# Patient Record
Sex: Male | Born: 1965
Health system: Southern US, Community
[De-identification: ages and names within clinical notes are randomized; demographics above are authoritative.]

## PROBLEM LIST (undated history)

## (undated) DIAGNOSIS — F209 Schizophrenia, unspecified: Secondary | ICD-10-CM

## (undated) DIAGNOSIS — F419 Anxiety disorder, unspecified: Secondary | ICD-10-CM

## (undated) DIAGNOSIS — F411 Generalized anxiety disorder: Secondary | ICD-10-CM

## (undated) DIAGNOSIS — E119 Type 2 diabetes mellitus without complications: Secondary | ICD-10-CM

## (undated) HISTORY — DX: Schizophrenia, unspecified: F20.9

## (undated) HISTORY — DX: Anxiety disorder, unspecified: F41.9

## (undated) HISTORY — DX: Generalized anxiety disorder: F41.1

## (undated) HISTORY — PX: TONSILLECTOMY: SUR1361

---

## 2006-11-15 ENCOUNTER — Emergency Department: Payer: Self-pay | Admitting: Emergency Medicine

## 2007-04-18 ENCOUNTER — Emergency Department: Payer: Self-pay | Admitting: Emergency Medicine

## 2007-04-24 ENCOUNTER — Emergency Department: Payer: Self-pay | Admitting: Emergency Medicine

## 2007-04-25 ENCOUNTER — Ambulatory Visit: Payer: Self-pay | Admitting: Internal Medicine

## 2007-05-11 ENCOUNTER — Ambulatory Visit: Payer: Self-pay | Admitting: Internal Medicine

## 2007-08-10 ENCOUNTER — Ambulatory Visit: Payer: Self-pay | Admitting: Internal Medicine

## 2014-03-19 LAB — COMPREHENSIVE METABOLIC PANEL
ALT: 37 U/L
ANION GAP: 8 (ref 7–16)
AST: 30 U/L (ref 15–37)
Albumin: 4.1 g/dL (ref 3.4–5.0)
Alkaline Phosphatase: 88 U/L
BILIRUBIN TOTAL: 0.6 mg/dL (ref 0.2–1.0)
BUN: 25 mg/dL — AB (ref 7–18)
Calcium, Total: 8.6 mg/dL (ref 8.5–10.1)
Chloride: 102 mmol/L (ref 98–107)
Co2: 30 mmol/L (ref 21–32)
Creatinine: 1.27 mg/dL (ref 0.60–1.30)
EGFR (Non-African Amer.): >60
GLUCOSE: 104 mg/dL — AB (ref 65–99)
Osmolality: 284 (ref 275–301)
Potassium: 3.9 mmol/L (ref 3.5–5.1)
Sodium: 140 mmol/L (ref 136–145)
Total Protein: 7.4 g/dL (ref 6.4–8.2)

## 2014-03-19 LAB — DRUG SCREEN, URINE
Amphetamines, Ur Screen: NEGATIVE (ref ?–1000)
BARBITURATES, UR SCREEN: NEGATIVE (ref ?–200)
BENZODIAZEPINE, UR SCRN: NEGATIVE (ref ?–200)
CANNABINOID 50 NG, UR ~~LOC~~: NEGATIVE (ref ?–50)
COCAINE METABOLITE, UR ~~LOC~~: NEGATIVE (ref ?–300)
MDMA (Ecstasy)Ur Screen: NEGATIVE (ref ?–500)
Methadone, Ur Screen: NEGATIVE (ref ?–300)
OPIATE, UR SCREEN: NEGATIVE (ref ?–300)
Phencyclidine (PCP) Ur S: NEGATIVE (ref ?–25)
Tricyclic, Ur Screen: NEGATIVE (ref ?–1000)

## 2014-03-19 LAB — SALICYLATE LEVEL

## 2014-03-19 LAB — CBC
HCT: 45.8 % (ref 40.0–52.0)
HGB: 15.3 g/dL (ref 13.0–18.0)
MCH: 29.5 pg (ref 26.0–34.0)
MCHC: 33.5 g/dL (ref 32.0–36.0)
MCV: 88 fL (ref 80–100)
Platelet: 239 10*3/uL (ref 150–440)
RBC: 5.2 10*6/uL (ref 4.40–5.90)
RDW: 13.2 % (ref 11.5–14.5)
WBC: 7.9 10*3/uL (ref 3.8–10.6)

## 2014-03-19 LAB — URINALYSIS, COMPLETE
BILIRUBIN, UR: NEGATIVE
Bacteria: NONE SEEN
Blood: NEGATIVE
Glucose,UR: NEGATIVE mg/dL (ref 0–75)
Ketone: NEGATIVE
LEUKOCYTE ESTERASE: NEGATIVE
NITRITE: NEGATIVE
PROTEIN: NEGATIVE
Ph: 7 (ref 4.5–8.0)
RBC,UR: <1 /HPF (ref 0–5)
Specific Gravity: 1.005 (ref 1.003–1.030)
Squamous Epithelial: NONE SEEN
WBC UR: <1 /HPF (ref 0–5)

## 2014-03-19 LAB — ACETAMINOPHEN LEVEL: Acetaminophen: <2 ug/mL

## 2014-03-19 LAB — ETHANOL

## 2014-03-20 ENCOUNTER — Inpatient Hospital Stay: Payer: Self-pay | Admitting: Psychiatry

## 2014-03-23 LAB — LIPID PANEL
Cholesterol: 154 mg/dL (ref 0–200)
HDL: 26 mg/dL — AB (ref 40–60)
Ldl Cholesterol, Calc: 93 mg/dL (ref 0–100)
TRIGLYCERIDES: 176 mg/dL (ref 0–200)
VLDL CHOLESTEROL, CALC: 35 mg/dL (ref 5–40)

## 2014-03-23 LAB — HEMOGLOBIN A1C: HEMOGLOBIN A1C: 5.5 % (ref 4.2–6.3)

## 2014-03-23 LAB — FOLATE: Folic Acid: 32.5 ng/mL (ref 3.1–100.0)

## 2014-03-23 LAB — TSH: THYROID STIMULATING HORM: 2.13 u[IU]/mL

## 2014-10-01 NOTE — H&P (Signed)
PATIENT NAME:  Benjamin Braun, Benjamin Braun MR#:  [id number] DATE OF BIRTH:  1966/04/17  DATE OF ADMISSION:  03/20/2014  DATE OF ASSESSMENT: 03/21/2014   REFERRING PHYSICIAN: Emergency Room MD    ATTENDING PHYSICIAN: Kiril Hippe B. Bary Leriche, MD   IDENTIFYING DATA: Benjamin Braun is a 49 year old male with history of schizophrenia.   CHIEF COMPLAINT: "I need to go home to my pappy."   HISTORY OF PRESENT ILLNESS: Benjamin Braun has a long history of schizophrenia. He has been a patient of Dr. Johnanna Schneiders at [facility] for years. Apparently he relocated to our area and has been a patient of Dr. Annitta Jersey. The patient is a poor historian and really unable to provide much information about the circumstances of his admission. Apparently he stopped prescribed medication of Risperdal and became increasingly psychotic. He called police on 2 of his neighbors believing that they behave strangely. When police arrived at the scene they brought the patient to the Emergency Room. He was paranoid and slightly agitated.   In the Emergency Room, he felt that we put sperm in the water and that he contracted herpes this way in our Emergency Room. At the same time he tells me that he was tested for herpes by his primary doctor and he is alright, which tells me that it may be a fixed delusion. He admits to stopping Risperdal as it made his stomach tight. He continued taking clonazepam but even that is doubtful since his last appointment with Dr. Annitta Jersey was a while ago. Dr. Annitta Jersey reported that the patient is psychotic and paranoid at baseline, noncompliant with medication and doctor's visits. She does not feel comfortable continuing with this patient and believes that he needs additional resources in the community. Unfortunately, he has Managed Medicare only and does not qualify for [facility] services. We will find a provider in our community to serve this patient. The patient reports that in the past he did well on Zyprexa but agrees to take only 2 mg  of it. He also likes clonazepam. He adamantly denies any symptoms of depression, anxiety, or psychosis. He tells me that his pappy and a fish were left in the house and he worries that pappy may be dead already. He also left some exotic plants out and worries that they are also destroyed as it was a cold night last night. He denies thoughts of hurting himself or others. He denies substance use.   PAST PSYCHIATRIC HISTORY: Four or 5 past hospitalizations mostly at [facility]. Last time he tells me a while ago he was brought to the Emergency Room of [facility] but was not admitted, discharged to home after 4 hours. He denies ever attempting suicide.   FAMILY PSYCHIATRIC HISTORY: The patient denies any history of mental illness in the family.   PAST MEDICAL HISTORY: Reportedly he has elevated blood pressure but takes no medication for this. He is preoccupied with sexually transmitted diseases but it probably is delusional.   ALLERGIES: PERCOCET.   MEDICATIONS ON ADMISSION: Klonopin, unknown dose, Risperdal unknown dose. The patient was noncompliant.   SOCIAL HISTORY: He lives by himself. He is disabled from mental illness. He has Multimedia programmer. There is no family involvement.   REVIEW OF SYSTEMS:  CONSTITUTIONAL: No fevers or chills. No weight changes.  EYES: No double or blurred vision.  EARS, NOSE AND THROAT: No hearing loss.   RESPIRATORY: No shortness of breath or cough.  CARDIOVASCULAR: No chest pain or orthopnea.  GASTROINTESTINAL: No abdominal pain, nausea, vomiting,  or diarrhea.  GENITOURINARY: No incontinence or frequency.  ENDOCRINE: No heat or cold intolerance.  LYMPHATIC: No anemia or easy bruising.  INTEGUMENTARY: No acne or rash.  MUSCULOSKELETAL: No muscle or joint pain.  NEUROLOGIC: No tingling or weakness.  PSYCHIATRIC: See history of present illness.   PHYSICAL EXAMINATION:  VITAL SIGNS: Blood pressure 155/101, pulse 106, respirations 20, temperature 99.   GENERAL: This is a  well-developed male in no acute distress.  HEENT: The pupils are equal, round, and reactive to light. Sclerae anicteric. The patient complains of short sidedness and is hard of hearing. He did not bring his glasses or his hearing aides to the hospital and it makes the interview a little more difficult.  NECK: Supple. No thyromegaly.  LUNGS: Clear to auscultation.  No dullness to percussion.  HEART: Regular rhythm and rate. No murmurs, rubs, or gallops.  ABDOMEN: Soft, nontender, nondistended. Positive bowel sounds.  MUSCULOSKELETAL: Normal muscle strength in all extremities.  SKIN: No rashes or bruises.  LYMPHATIC: No cervical adenopathy.  NEUROLOGIC: Cranial nerves II through XII are intact.   LABORATORY DATA: Chemistries are within normal limits except for blood glucose of 104. Blood alcohol level is 0. LFTs within normal limits. Urine tox screen negative for substances. CBC within normal limits. Urinalysis is not suggestive of urinary tract infection. Serum acetaminophen and salicylate are low.   MENTAL STATUS EXAMINATION ON ADMISSION: The patient is alert and oriented to person, place, time and somewhat to situation. He is anxious-appearing. He maintains good eye contact. His speech is of normal rhythm, rate and volume. He is well groomed and casually dressed. His mood is anxious with odd affect. Thought process is logical and goal oriented. Thought content: He denies thoughts of hurting himself or others, delusions or paranoia. He denies auditory or visual hallucinations but clearly was paranoid and delusional on admission and most likely hallucinating according to the note from the Emergency Room. His cognition is grossly intact but difficult to assess in a formal away. He seems of normal intelligence and fund of knowledge. His insight and judgment are extremely poor.   SUICIDE RISK ASSESSMENT ON ADMISSION: This is a patient with a long history of schizophrenia but no mood symptoms or suicidal  history who came to the hospital floridly psychotic, unable to take care of himself in the context of treatment noncompliance.   INITIAL DIAGNOSES:  AXIS I: Schizophrenia.  AXIS II: Deferred.  AXIS III: Hypertension.   PLAN: The patient was admitted to [facility] for safety, stabilization and medication management. He was initially placed on suicide precautions and was closely monitored for any unsafe behaviors. He underwent full psychiatric and risk assessment. He received pharmacotherapy, individual and group psychotherapy, substance abuse counseling, and support from therapeutic milieu.  1.  Psychosis. We will start Zyprexa 10 mg at night.  2.  Anxiety. We will start Klonopin 0.5 mg 3 times daily.  3.  Social. Education officer, museum to investigate if there truly is a pappy and if we can save the pappy.  4.  Disposition. He will be discharged back to home. Follow up to be established. There are no providers within the network for this unfortunate patient in our area.    ____________________________ Herma Ard B. Bary Leriche, MD jbp:AT D: 03/21/2014 23:22:00 ET T: 03/22/2014 00:09:20 ET JOB#: [id number]  cc: Kale Dols B. Bary Leriche, MD, <Dictator> Clovis Fredrickson MD ELECTRONICALLY SIGNED 04/18/2014 6:15

## 2014-11-18 ENCOUNTER — Ambulatory Visit (INDEPENDENT_AMBULATORY_CARE_PROVIDER_SITE_OTHER): Payer: Medicare HMO | Admitting: Psychiatry

## 2014-11-18 ENCOUNTER — Encounter: Payer: Self-pay | Admitting: Psychiatry

## 2014-11-18 VITALS — BP 118/76 | HR 100 | Temp 97.7°F | Ht 67.0 in

## 2014-11-18 DIAGNOSIS — F209 Schizophrenia, unspecified: Secondary | ICD-10-CM | POA: Diagnosis not present

## 2014-11-18 MED ORDER — OLANZAPINE 5 MG PO TABS: 5.0000 mg | ORAL_TABLET | Freq: Every day | ORAL | Status: DC

## 2014-11-18 MED ORDER — CLONAZEPAM 1 MG PO TABS: 1.0000 mg | ORAL_TABLET | Freq: Two times a day (BID) | ORAL | Status: DC

## 2014-11-18 NOTE — Progress Notes (Signed)
[place] MD/PA/NP OP Progress Note  11/18/2014 11:00 AM Benjamin Braun  MRN:  [id number]  Subjective:  Patient returns for follow-up of his schizophrenia. He states there been no changes in his issues. He states he still hears a voice of God. He states it's a pleasant voice and gives some good advice. He states he has other voices but denies that any of them give him commands to hurt himself or others. As in past he has some paranoia about people stealing from him. He states he enjoys watching TV. He states his appetite is been good and his sleep is good.  Again discussed with metabolic issues related to Zyprexa and given him a lab slip to have these labs assessed. Chief Complaint: refills Visit Diagnosis:     ICD-9-CM ICD-10-CM   1. Schizophrenia, unspecified type 295.90 F20.9 OLANZapine (ZYPREXA) 5 MG tablet     clonazePAM (KLONOPIN) 1 MG tablet    Past Medical History:  Past Medical History  Diagnosis Date  . Schizophrenia   . Generalized anxiety disorder    History reviewed. No pertinent past surgical history. Family History: No family history on file. Social History:  History   Social History  . Marital Status: Single    Spouse Name: N/A  . Number of Children: N/A  . Years of Education: N/A   Social History Main Topics  . Smoking status: Former Smoker    Types: Cigarettes    Quit date: 06/11/1999  . Smokeless tobacco: Never Used  . Alcohol Use: Not on file  . Drug Use: Not on file  . Sexual Activity: Not on file   Other Topics Concern  . None   Social History Narrative  . None   Additional History:   Assessment:   Musculoskeletal: Strength & Muscle Tone: within normal limits Gait & Station: normal Patient leans: N/A  Psychiatric Specialty Exam: HPI  Review of Systems  Psychiatric/Behavioral: Positive for hallucinations (he hears the voice of God as he has in the past. This is been stable and is not distressing to him.). Negative for depression, suicidal ideas,  memory loss and substance abuse. The patient is not nervous/anxious and does not have insomnia.     Blood pressure 118/76, pulse 100, temperature 97.7 F (36.5 C), temperature source Tympanic, height 5\' 7"  (1.702 m), SpO2 96 %.There is no weight on file to calculate BMI.  General Appearance: Fairly Groomed  Eye Contact:  Fair  Speech:  Normal Rate  Volume:  Normal  Mood:  Good  Affect:  Bright  Thought Process:  Loose at baseline  Orientation:  Full (Time, Place, and Person)  Thought Content:  Negative and Paranoia about people stealing from him  Suicidal Thoughts:  No  Homicidal Thoughts:  No  Memory:  Immediate;   Fair Recent;   Fair Remote;   Fair  Judgement:  Fair  Insight:  Fair  Psychomotor Activity:  Negative  Concentration:  Fair  Recall:  [employer] of Knowledge: Fair  Language: Good  Akathisia:  Negative  Handed:  Right unknown  AIMS (if indicated):  Last done November 2015  Assets:  Desire for Improvement  ADL's:  Intact  Cognition: WNL  Sleep:  Good   Is the patient at risk to self?  No. Has the patient been a risk to self in the past 6 months?  No. Has the patient been a risk to self within the distant past?  No. Is the patient a risk to others?  No.  Has the patient been a risk to others in the past 6 months?  No. Has the patient been a risk to others within the distant past?  No.  Current Medications: Current Outpatient Prescriptions  Medication Sig Dispense Refill  . clonazePAM (KLONOPIN) 1 MG tablet Take 1 tablet (1 mg total) by mouth 2 (two) times daily. 60 tablet 3  . OLANZapine (ZYPREXA) 5 MG tablet Take 1 tablet (5 mg total) by mouth daily. 30 tablet 3   No current facility-administered medications for this visit.    Medical Decision Making:  Established Problem, Stable/Improving (1)  Treatment Plan Summary:Medication management will continue patient's medications without any changes. We will continue Zyprexa 5 mg daily and clonazepam 1 mg twice  daily. Follow-up in 3 months. I continued to try to encourage him and work with him to get metabolic labs done. I have encouraged him to continue to try to make his appointments for medication monitoring.   Faith Rogue 11/18/2014, 11:00 AM

## 2015-03-02 ENCOUNTER — Telehealth: Payer: Self-pay

## 2015-03-02 NOTE — Telephone Encounter (Signed)
pt states medication no working wants something

## 2015-03-03 NOTE — Telephone Encounter (Signed)
line was busy could not leave message

## 2015-03-17 ENCOUNTER — Ambulatory Visit: Payer: Self-pay | Admitting: Psychiatry

## 2015-03-24 ENCOUNTER — Ambulatory Visit (INDEPENDENT_AMBULATORY_CARE_PROVIDER_SITE_OTHER): Payer: Medicare HMO | Admitting: Psychiatry

## 2015-03-24 ENCOUNTER — Encounter: Payer: Self-pay | Admitting: Psychiatry

## 2015-03-24 VITALS — BP 122/82 | HR 132 | Temp 97.1°F | Ht 66.0 in | Wt 197.2 lb

## 2015-03-24 DIAGNOSIS — F209 Schizophrenia, unspecified: Secondary | ICD-10-CM | POA: Diagnosis not present

## 2015-03-24 MED ORDER — CLONAZEPAM 1 MG PO TABS: 1.0000 mg | ORAL_TABLET | Freq: Two times a day (BID) | ORAL | Status: DC

## 2015-03-24 MED ORDER — OLANZAPINE 5 MG PO TABS: 5.0000 mg | ORAL_TABLET | Freq: Every day | ORAL | Status: DC
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
## 2015-03-28 ENCOUNTER — Telehealth: Payer: Self-pay | Admitting: Psychiatry

## 2015-03-29 NOTE — Telephone Encounter (Signed)
line was busy.

## 2015-04-03 NOTE — Telephone Encounter (Signed)
line busy

## 2015-04-06 ENCOUNTER — Other Ambulatory Visit: Payer: Self-pay | Admitting: Psychiatry

## 2015-04-07 ENCOUNTER — Other Ambulatory Visit: Payer: Self-pay | Admitting: Psychiatry

## 2015-04-13 ENCOUNTER — Other Ambulatory Visit: Payer: Self-pay

## 2015-04-13 ENCOUNTER — Emergency Department
Admission: EM | Admit: 2015-04-13 | Discharge: 2015-04-15 | Disposition: A | Discharge status: Other Acute Inpatient Hospital | Payer: Medicare HMO | Attending: Student | Admitting: Student

## 2015-04-13 ENCOUNTER — Emergency Department: Payer: Medicare HMO

## 2015-04-13 DIAGNOSIS — Z87891 Personal history of nicotine dependence: Secondary | ICD-10-CM | POA: Diagnosis not present

## 2015-04-13 DIAGNOSIS — F200 Paranoid schizophrenia: Secondary | ICD-10-CM | POA: Diagnosis not present

## 2015-04-13 DIAGNOSIS — Z79899 Other long term (current) drug therapy: Secondary | ICD-10-CM | POA: Insufficient documentation

## 2015-04-13 DIAGNOSIS — F209 Schizophrenia, unspecified: Secondary | ICD-10-CM | POA: Diagnosis not present

## 2015-04-13 DIAGNOSIS — Z91199 Patient's noncompliance with other medical treatment and regimen due to unspecified reason: Secondary | ICD-10-CM

## 2015-04-13 DIAGNOSIS — Z9119 Patient's noncompliance with other medical treatment and regimen: Secondary | ICD-10-CM

## 2015-04-13 DIAGNOSIS — F919 Conduct disorder, unspecified: Secondary | ICD-10-CM | POA: Diagnosis present

## 2015-04-13 LAB — CBC
HCT: 43.2 % (ref 40.0–52.0)
HEMOGLOBIN: 14.6 g/dL (ref 13.0–18.0)
MCH: 29 pg (ref 26.0–34.0)
MCHC: 33.7 g/dL (ref 32.0–36.0)
MCV: 86 fL (ref 80.0–100.0)
Platelets: 238 10*3/uL (ref 150–440)
RBC: 5.02 MIL/uL (ref 4.40–5.90)
RDW: 13.1 % (ref 11.5–14.5)
WBC: 8.3 10*3/uL (ref 3.8–10.6)

## 2015-04-13 LAB — COMPREHENSIVE METABOLIC PANEL
ALT: 46 U/L (ref 17–63)
AST: 40 U/L (ref 15–41)
Albumin: 4.2 g/dL (ref 3.5–5.0)
Alkaline Phosphatase: 112 U/L (ref 38–126)
Anion gap: 7 (ref 5–15)
BUN: 15 mg/dL (ref 6–20)
CALCIUM: 8.8 mg/dL — AB (ref 8.9–10.3)
CHLORIDE: 104 mmol/L (ref 101–111)
CO2: 28 mmol/L (ref 22–32)
CREATININE: 1.35 mg/dL — AB (ref 0.61–1.24)
GFR, EST NON AFRICAN AMERICAN: 59 mL/min — AB (ref >60)
Glucose, Bld: 189 mg/dL — ABNORMAL HIGH (ref 65–99)
POTASSIUM: 3.6 mmol/L (ref 3.5–5.1)
Sodium: 139 mmol/L (ref 135–145)
Total Bilirubin: 0.5 mg/dL (ref 0.3–1.2)
Total Protein: 6.8 g/dL (ref 6.5–8.1)

## 2015-04-13 LAB — ETHANOL: Alcohol, Ethyl (B): <5 mg/dL (ref <5)

## 2015-04-13 LAB — GLUCOSE, CAPILLARY: GLUCOSE-CAPILLARY: 244 mg/dL — AB (ref 65–99)

## 2015-04-13 LAB — SALICYLATE LEVEL

## 2015-04-13 LAB — ACETAMINOPHEN LEVEL

## 2015-04-13 NOTE — ED Notes (Signed)
Pt brought to ER per pt,  by [facility] after [employer] Deputies came to his apt, and took him away after a complaint (pt did not express if complaint was from pt or neighbor). Pt states " I think my neighbor is coming in my door when I am gone and stealing from me, he plays black music all the time, I like it sometimes but not all the time" "God wants me to listen to christian music, I do sometimes, but God and I have not been talking lately". Pt hyper-religious during assessment. Pt denies SI/HI/AH/VH, paranoia, and use of drugs or ETOH.  Pt talking while others are not in room. Pt stated "I am asking God if there is anything I can do for him that will help me to be free"

## 2015-04-13 NOTE — ED Notes (Addendum)
Pt brought in by [employer].  Pt is handcuffed.  Pt is IVC.  Pt hyper religious in triage.   Pt denies SI.  Denies etoh or drug use.  Pt has a hematoma to forehead.  Pt hit head on the ground while being handcuffed.  No loc.  Pt alert.  Speech clear

## 2015-04-13 NOTE — ED Notes (Signed)
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
## 2015-04-13 NOTE — ED Notes (Signed)
Pt in room. Pt drinking water [er request of Dr Edd Fabian to help bring pt's HR down from upper 120's No complaints or concerns voiced at this time. No abnormal behavior noted at this time. Will continue to monitor with q15 min checks. ODS officer in area.

## 2015-04-14 DIAGNOSIS — Z91199 Patient's noncompliance with other medical treatment and regimen due to unspecified reason: Secondary | ICD-10-CM

## 2015-04-14 DIAGNOSIS — Z9119 Patient's noncompliance with other medical treatment and regimen: Secondary | ICD-10-CM

## 2015-04-14 DIAGNOSIS — F209 Schizophrenia, unspecified: Secondary | ICD-10-CM | POA: Diagnosis not present

## 2015-04-14 DIAGNOSIS — F200 Paranoid schizophrenia: Secondary | ICD-10-CM

## 2015-04-14 LAB — URINE DRUG SCREEN, QUALITATIVE (ARMC ONLY)
AMPHETAMINES, UR SCREEN: NOT DETECTED
BENZODIAZEPINE, UR SCRN: NOT DETECTED
Barbiturates, Ur Screen: NOT DETECTED
Cannabinoid 50 Ng, Ur ~~LOC~~: NOT DETECTED
Cocaine Metabolite,Ur ~~LOC~~: NOT DETECTED
MDMA (ECSTASY) UR SCREEN: NOT DETECTED
METHADONE SCREEN, URINE: NOT DETECTED
Opiate, Ur Screen: NOT DETECTED
Phencyclidine (PCP) Ur S: NOT DETECTED
TRICYCLIC, UR SCREEN: NOT DETECTED

## 2015-04-14 LAB — URINALYSIS COMPLETE WITH MICROSCOPIC (ARMC ONLY)
Bacteria, UA: NONE SEEN
Bilirubin Urine: NEGATIVE
Glucose, UA: NEGATIVE mg/dL
HGB URINE DIPSTICK: NEGATIVE
KETONES UR: NEGATIVE mg/dL
LEUKOCYTES UA: NEGATIVE
NITRITE: NEGATIVE
PROTEIN: NEGATIVE mg/dL
SPECIFIC GRAVITY, URINE: 1.008 (ref 1.005–1.030)
pH: 7 (ref 5.0–8.0)

## 2015-04-14 MED ORDER — OLANZAPINE 5 MG PO TABS
ORAL_TABLET | ORAL | Status: AC
  Filled 2015-04-14: qty 1

## 2015-04-14 MED ORDER — ZIPRASIDONE MESYLATE 20 MG IM SOLR: 20.0000 mg | Freq: Once | INTRAMUSCULAR | Status: DC

## 2015-04-14 MED ORDER — CLONAZEPAM 0.5 MG PO TABS
1.0000 mg | ORAL_TABLET | Freq: Once | ORAL | Status: AC
  Administered 2015-04-14: 1 mg via ORAL

## 2015-04-14 MED ORDER — BACITRACIN 500 UNIT/GM EX OINT
TOPICAL_OINTMENT | Freq: Two times a day (BID) | CUTANEOUS | Status: DC
  Administered 2015-04-14: 1 via TOPICAL
  Filled 2015-04-14: qty 0.9

## 2015-04-14 MED ORDER — OLANZAPINE 5 MG PO TABS
5.0000 mg | ORAL_TABLET | ORAL | Status: AC
  Administered 2015-04-14: 5 mg via ORAL

## 2015-04-14 MED ORDER — OLANZAPINE 10 MG PO TABS
10.0000 mg | ORAL_TABLET | Freq: Two times a day (BID) | ORAL | Status: DC
  Administered 2015-04-14: 15:00:00 via ORAL
  Administered 2015-04-14 – 2015-04-15 (×2): 10 mg via ORAL
  Filled 2015-04-14 (×3): qty 1

## 2015-04-14 MED ORDER — TRAZODONE HCL 50 MG PO TABS
ORAL_TABLET | ORAL | Status: AC
  Administered 2015-04-14: 50 mg via ORAL
  Filled 2015-04-14: qty 1

## 2015-04-14 MED ORDER — LORAZEPAM 1 MG PO TABS: 1.0000 mg | ORAL_TABLET | Freq: Once | ORAL | Status: DC

## 2015-04-14 MED ORDER — LORAZEPAM 1 MG PO TABS
ORAL_TABLET | ORAL | Status: AC
  Filled 2015-04-14: qty 1

## 2015-04-14 MED ORDER — CLONAZEPAM 0.5 MG PO TABS
ORAL_TABLET | ORAL | Status: AC
  Administered 2015-04-14: 1 mg via ORAL
  Filled 2015-04-14: qty 2

## 2015-04-14 MED ORDER — TRAZODONE HCL 50 MG PO TABS
50.0000 mg | ORAL_TABLET | Freq: Every day | ORAL | Status: DC
  Administered 2015-04-14: 50 mg via ORAL

## 2015-04-14 MED ORDER — OLANZAPINE 5 MG PO TABS
5.0000 mg | ORAL_TABLET | Freq: Once | ORAL | Status: AC
  Administered 2015-04-14: 5 mg via ORAL
  Filled 2015-04-14: qty 1

## 2015-04-14 MED ORDER — BACITRACIN ZINC 500 UNIT/GM EX OINT
TOPICAL_OINTMENT | CUTANEOUS | Status: AC
Start: 2015-04-14 — End: 2015-04-14
  Administered 2015-04-14: 1 via TOPICAL
  Filled 2015-04-14: qty 0.9

## 2015-04-14 NOTE — ED Notes (Signed)
Pt. Noted in room resting quietly;. No complaints or concerns voiced. No distress or abnormal behavior noted. Will continue to monitor with security cameras. Q 15 minute rounds continue. 

## 2015-04-14 NOTE — ED Notes (Signed)
Pt. Noted in room reading the bible. No complaints or concerns voiced. No distress or abnormal behavior noted. Will continue to monitor with security cameras. Q 15 minute rounds continue.

## 2015-04-14 NOTE — ED Notes (Signed)
Patient resting quietly in room. No noted distress or abnormal behaviors noted. Will continue 15 minute checks and observation by security camera for safety. 

## 2015-04-14 NOTE — ED Notes (Signed)
Report received from Amy H.RN. Pt. Alert and oriented in no distress denies SI, HI, AVH and pain.  Pt. Instructed to come to me with problems or concerns.Will continue to monitor for safety via security cameras and Q 15 minute checks.

## 2015-04-14 NOTE — ED Notes (Signed)
Patient exhibiting delusional thinking, hyper-religious thoughts, still verbally loud, pressured. Verbally offensive at times. Patient was redirected back to his room - he did cooperate.  Case discussed with [name]. Order written for PRN medications to assist with agitation. Will continue to closely monitor.

## 2015-04-14 NOTE — ED Notes (Signed)

## 2015-04-14 NOTE — ED Notes (Signed)
Patient still verbally loud, tangential, pressured. Able to respond somewhat to redirection. Maintained on 15 minute checks and observation by security camera for safety.

## 2015-04-14 NOTE — ED Notes (Signed)
Report called to [facility], Pt to move to [facility] room 3.

## 2015-04-14 NOTE — ED Notes (Signed)
Pt. To ED-BHU from ED ambulatory without difficulty, to room BHU-3  . Report from RN. Pt. Is alert and oriented, warm and dry in no distress. Pt. Denies SI, HI,; but presents exhibiting with AVH; hyper-religious; manic. Pt. Is also hyper-verbal.  Pt. made aware of security cameras and Q15 minute rounds. Pt. Encouraged to let Nursing staff know of any concerns or needs.

## 2015-04-14 NOTE — ED Notes (Signed)
Patient received additional dose of zyprexa per MD order. He was cooperative with taking medications.

## 2015-04-14 NOTE — ED Notes (Signed)
Pt. Noted in room resting quietly. Pt. Voiced that, his right knee area is painfull; noted reddened area on rt. Knee. Per pt., "I got this when I was restrained; I have trouble sleeping sometimes; I'm not going to hurt nobody; my hearing aid battery died; those medicines make me throw up; I'm allergic to medicine No distress or abnormal behavior noted. Will continue to monitor with security cameras. Q 15 minute rounds continue.

## 2015-04-14 NOTE — ED Notes (Signed)
Pt. Noted in room sleeping;. No complaints or concerns voiced. No distress or abnormal behavior noted. Will continue to monitor with security cameras. Q 15 minute rounds continue. 

## 2015-04-14 NOTE — ED Notes (Signed)
Patient exhibiting labile speech and behavior. Loud, pressured speech. Delusional thinking. Poor response to reality orientation.Maintained on 15 minute checks and observation by security camera for safety.

## 2015-04-14 NOTE — ED Notes (Signed)
Patient taking medications as ordered. Maintained on 15 minute checks and observation by security camera for safety.

## 2015-04-14 NOTE — ED Provider Notes (Signed)
-----------------------------------------   6:55 AM on 04/14/2015 -----------------------------------------   Blood pressure 134/98, pulse 103, temperature 98.7 F (37.1 C), temperature source Oral, resp. rate 19, height 5\' 6"  (1.676 m), weight 196 lb (88.905 kg), SpO2 97 %.  The patient had no acute events since last update.  Calm and cooperative at this time.  Disposition is pending per Psychiatry/Behavioral Medicine team recommendations.     Paulette Blanch, MD 04/14/15 [id number]

## 2015-04-14 NOTE — ED Notes (Signed)
Pt. Noted in  room watching the tv.;. No complaints or concerns voiced. No distress or abnormal behavior noted. Will continue to monitor with security cameras. Q 15 minute rounds continue. 

## 2015-04-14 NOTE — ED Notes (Signed)
BEHAVIORAL HEALTH ROUNDING Patient sleeping: No. Patient alert and oriented: yes Behavior appropriate: Yes.   Nutrition and fluids offered: Yes  Toileting and hygiene offered: Yes  Sitter present: q15 min observations Law enforcement present: Yes [employer] 

## 2015-04-14 NOTE — ED Notes (Signed)
Patient still labile, delusional, paranoid. Poor response to reality orientation. Needs firm limits to stay away from other patients as he antagonizes them.  Continue all safety checks.  Will discuss need for additional medication with ED physician.

## 2015-04-14 NOTE — ED Notes (Signed)
Report received from EB, RN. Pt. Alert and oriented in no distress denies SI, HI; currently delusional with , AVH and denies having any pain pain.  Pt. Instructed to come to me with problems or concerns.Will continue to monitor for safety via security cameras and Q 15 minute checks.

## 2015-04-14 NOTE — Consult Note (Signed)
[facility] Face-to-Face Psychiatry Consult   Reason for Consult:  Consult for this 49 year old man with a history of chronic psychotic disorder brought in by Event organiser. Referring Physician:  [place] Patient Identification: Benjamin Braun MRN:  [id number] Principal Diagnosis: Schizophrenia [facility]) Diagnosis:   Patient Active Problem List   Diagnosis Date Noted  . Schizophrenia ([place]) [F20.9] 04/14/2015  . Diabetes ([place]) [E11.9] 04/14/2015  . Noncompliance [Z91.19] 04/14/2015    Total Time spent with patient: 1 hour  Subjective:   Benjamin Braun is a 49 y.o. male patient admitted with "the sheriff put me here".  HPI:  Information from the patient and the chart. Patient interviewed. Chart reviewed. Commitment paperwork reviewed. Labs reviewed. Old records including in and outpatient records reviewed. This 49 year old man was brought here by Event organiser. They state that he had been calling law enforcement hotlines over and over again making what seemed like nonsensical complaints. They went by to talk to him and found him to be even more disorganized in his thinking making multiple bizarre statements including making statements about how the [place] fought to die. They were concerned about his mental state and brought him into the hospital. The patient has poor insight. He claims that he sleeps fine and that his mood is fine. He fully admits that he is been calling the sheriff's department and law enforcement and feels completely justified in doing so. He rambles psychotically about there being drug dealers and people breaking into his apartment. The stories don't make much sense. Patient claims he has been compliant with his prescribed psychiatric medicine and denies that he's been drinking or using drugs. Does not report any particular stressor. Her [place] history: Patient lives by himself. Does not have an active team. Sounds like he is fairly isolated. Won't go into much other social  history.  Family history history: Has a cousin whom he says was "raised up wrong"  Medical history: No known significant medical history that he's being treated for although he says that diabetes has been identified in the past and that he refuses to have her follow-up with treatment.  Substance abuse history: States he does not drink alcohol and does not use drugs but did use drugs for a while when he was a teenager.  Past Psychiatric History: Patient has a history of chronic psychotic disorder. He has only been brought to the hospital couple of times. Last hospitalization here was a few years ago. Otherwise he has mostly been treated as an outpatient. It has been stated in the past that he tends to be chronically psychotic and probably not very compliant with treatment. Denies any suicide attempts.  Risk to Self: Suicidal Ideation: No Suicidal Intent: No Is patient at risk for suicide?: No Suicidal Plan?: No Access to Means: No What has been your use of drugs/alcohol within the last 12 months?: Pt denies How many times?: 0 Other Self Harm Risks: None reported Triggers for Past Attempts: None known Intentional Self Injurious Behavior: None Risk to Others: Homicidal Ideation: No Thoughts of Harm to Others: No Current Homicidal Intent: No Current Homicidal Plan: No Access to Homicidal Means: No Identified Victim: None reported History of harm to others?: No Assessment of Violence: None Noted Violent Behavior Description: None reported Does patient have access to weapons?: No Criminal Charges Pending?: No Does patient have a court date: No Prior Inpatient Therapy: Prior Inpatient Therapy: No Prior Therapy Dates: N/A Prior Therapy Facilty/Provider(s): N/A Reason for Treatment: N/a Prior Outpatient  Therapy: Prior Outpatient Therapy: No Prior Therapy Dates: N/A Prior Therapy Facilty/Provider(s): N/a Reason for Treatment: N/a Does patient have an ACCT team?: Unknown Does patient  have Intensive In-House Services?  : No Does patient have [facility] services? : Unknown Does patient have P4CC services?: Unknown  Past Medical History:  Past Medical History  Diagnosis Date  . Schizophrenia ([place])   . Generalized anxiety disorder   . Anxiety     Past Surgical History  Procedure Laterality Date  . Tonsillectomy     Family History:  Family History  Problem Relation Age of Onset  . Family history unknown: Yes   Family Psychiatric  History: As noted above he says he has a cousin who was raised up wrong whatever that means. Denies substance abuse history in the family. Social History:  History  Alcohol Use No     History  Drug Use No    Social History   Social History  . Marital Status: Single    Spouse Name: N/A  . Number of Children: N/A  . Years of Education: N/A   Social History Main Topics  . Smoking status: Former Smoker    Types: Cigarettes    Quit date: 06/11/1999  . Smokeless tobacco: Never Used  . Alcohol Use: No  . Drug Use: No  . Sexual Activity: No   Other Topics Concern  . Not on file   Social History Narrative   Additional Social History:    History of alcohol / drug use?: No history of alcohol / drug abuse (Pt denies)                     Allergies:  No Known Allergies  Labs:  Results for orders placed or performed during the hospital encounter of 04/13/15 (from the past 48 hour(s))  Glucose, capillary     Status: Abnormal   Collection Time: 04/13/15  9:06 PM  Result Value Ref Range   Glucose-Capillary 244 (H) 65 - 99 mg/dL  Comprehensive metabolic panel     Status: Abnormal   Collection Time: 04/13/15  9:18 PM  Result Value Ref Range   Sodium 139 135 - 145 mmol/L   Potassium 3.6 3.5 - 5.1 mmol/L   Chloride 104 101 - 111 mmol/L   CO2 28 22 - 32 mmol/L   Glucose, Bld 189 (H) 65 - 99 mg/dL   BUN 15 6 - 20 mg/dL   Creatinine, Ser 1.35 (H) 0.61 - 1.24 mg/dL   Calcium 8.8 (L) 8.9 - 10.3 mg/dL   Total Protein 6.8 6.5  - 8.1 g/dL   Albumin 4.2 3.5 - 5.0 g/dL   AST 40 15 - 41 U/L   ALT 46 17 - 63 U/L   Alkaline Phosphatase 112 38 - 126 U/L   Total Bilirubin 0.5 0.3 - 1.2 mg/dL   GFR calc non Af Amer 59 (L) >60 mL/min   GFR calc Af Amer >60 >60 mL/min    Comment: (NOTE) The eGFR has been calculated using the CKD EPI equation. This calculation has not been validated in all clinical situations. eGFR's persistently <60 mL/min signify possible Chronic Kidney Disease.    Anion gap 7 5 - 15  Ethanol (ETOH)     Status: None   Collection Time: 04/13/15  9:18 PM  Result Value Ref Range   Alcohol, Ethyl (B) <5 <5 mg/dL    Comment:        LOWEST DETECTABLE LIMIT FOR SERUM ALCOHOL IS 5  mg/dL FOR MEDICAL PURPOSES ONLY   Salicylate level     Status: None   Collection Time: 04/13/15  9:18 PM  Result Value Ref Range   Salicylate Lvl <9.4 2.8 - 30.0 mg/dL  Acetaminophen level     Status: Abnormal   Collection Time: 04/13/15  9:18 PM  Result Value Ref Range   Acetaminophen (Tylenol), Serum <10 (L) 10 - 30 ug/mL    Comment:        THERAPEUTIC CONCENTRATIONS VARY SIGNIFICANTLY. A RANGE OF 10-30 ug/mL MAY BE AN EFFECTIVE CONCENTRATION FOR MANY PATIENTS. HOWEVER, SOME ARE BEST TREATED AT CONCENTRATIONS OUTSIDE THIS RANGE. ACETAMINOPHEN CONCENTRATIONS >150 ug/mL AT 4 HOURS AFTER INGESTION AND >50 ug/mL AT 12 HOURS AFTER INGESTION ARE OFTEN ASSOCIATED WITH TOXIC REACTIONS.   CBC     Status: None   Collection Time: 04/13/15  9:18 PM  Result Value Ref Range   WBC 8.3 3.8 - 10.6 K/uL   RBC 5.02 4.40 - 5.90 MIL/uL   Hemoglobin 14.6 13.0 - 18.0 g/dL   HCT 43.2 40.0 - 52.0 %   MCV 86.0 80.0 - 100.0 fL   MCH 29.0 26.0 - 34.0 pg   MCHC 33.7 32.0 - 36.0 g/dL   RDW 13.1 11.5 - 14.5 %   Platelets 238 150 - 440 K/uL  Urine Drug Screen, Qualitative ([facility] only)     Status: None   Collection Time: 04/13/15  9:18 PM  Result Value Ref Range   Tricyclic, Ur Screen NONE DETECTED NONE DETECTED   Amphetamines, Ur  Screen NONE DETECTED NONE DETECTED   MDMA (Ecstasy)Ur Screen NONE DETECTED NONE DETECTED   Cocaine Metabolite,Ur [place] NONE DETECTED NONE DETECTED   Opiate, Ur Screen NONE DETECTED NONE DETECTED   Phencyclidine (PCP) Ur S NONE DETECTED NONE DETECTED   Cannabinoid 50 Ng, Ur [place] NONE DETECTED NONE DETECTED   Barbiturates, Ur Screen NONE DETECTED NONE DETECTED   Benzodiazepine, Ur Scrn NONE DETECTED NONE DETECTED   Methadone Scn, Ur NONE DETECTED NONE DETECTED    Comment: (NOTE) 709  Tricyclics, urine               Cutoff 1000 ng/mL 200  Amphetamines, urine             Cutoff 1000 ng/mL 300  MDMA (Ecstasy), urine           Cutoff 500 ng/mL 400  Cocaine Metabolite, urine       Cutoff 300 ng/mL 500  Opiate, urine                   Cutoff 300 ng/mL 600  Phencyclidine (PCP), urine      Cutoff 25 ng/mL 700  Cannabinoid, urine              Cutoff 50 ng/mL 800  Barbiturates, urine             Cutoff 200 ng/mL 900  Benzodiazepine, urine           Cutoff 200 ng/mL 1000 Methadone, urine                Cutoff 300 ng/mL 1100 1200 The urine drug screen provides only a preliminary, unconfirmed 1300 analytical test result and should not be used for non-medical 1400 purposes. Clinical consideration and professional judgment should 1500 be applied to any positive drug screen result due to possible 1600 interfering substances. A more specific alternate chemical method 1700 must be used in order to obtain a confirmed analytical result.  1800 Gas chromato graphy / mass spectrometry (GC/MS) is the preferred 1900 confirmatory method.   Urinalysis complete, with microscopic ([facility] only)     Status: Abnormal   Collection Time: 04/13/15  9:18 PM  Result Value Ref Range   Color, Urine STRAW (A) YELLOW   APPearance CLEAR (A) CLEAR   Glucose, UA NEGATIVE NEGATIVE mg/dL   Bilirubin Urine NEGATIVE NEGATIVE   Ketones, ur NEGATIVE NEGATIVE mg/dL   Specific Gravity, Urine 1.008 1.005 - 1.030   Hgb urine dipstick  NEGATIVE NEGATIVE   pH 7.0 5.0 - 8.0   Protein, ur NEGATIVE NEGATIVE mg/dL   Nitrite NEGATIVE NEGATIVE   Leukocytes, UA NEGATIVE NEGATIVE   RBC / HPF 0-5 0 - 5 RBC/hpf   WBC, UA 0-5 0 - 5 WBC/hpf   Bacteria, UA NONE SEEN NONE SEEN   Squamous Epithelial / LPF 0-5 (A) NONE SEEN    Current Facility-Administered Medications  Medication Dose Route Frequency Provider Last Rate Last Dose  . LORazepam (ATIVAN) tablet 1 mg  1 mg Oral Once Paulette Blanch, MD   0 mg at 04/14/15 0106  . OLANZapine (ZYPREXA) 5 MG tablet           . OLANZapine (ZYPREXA) tablet 10 mg  10 mg Oral BID AC Gonzella Lex, MD       Current Outpatient Prescriptions  Medication Sig Dispense Refill  . clonazePAM (KLONOPIN) 1 MG tablet Take 1 tablet (1 mg total) by mouth 2 (two) times daily. 60 tablet 3  . cyclobenzaprine (FLEXERIL) 10 MG tablet Take 10 mg by mouth every 8 (eight) hours as needed (for back/neck pain).    Marland Kitchen OLANZapine (ZYPREXA) 5 MG tablet Take 1 tablet (5 mg total) by mouth daily. 30 tablet 3    Musculoskeletal: Strength & Muscle Tone: within normal limits Gait & Station: normal Patient leans: N/A  Psychiatric Specialty Exam: Review of Systems  Constitutional: Negative.   HENT: Negative.   Eyes: Negative.   Respiratory: Negative.   Cardiovascular: Negative.   Gastrointestinal: Negative.   Musculoskeletal: Negative.   Skin: Negative.   Neurological: Negative.   Psychiatric/Behavioral: Positive for hallucinations. Negative for depression, suicidal ideas, memory loss and substance abuse. The patient is nervous/anxious. The patient does not have insomnia.     Blood pressure 124/79, pulse 92, temperature 97.8 F (36.6 C), temperature source Oral, resp. rate 20, height 5' 6"  (1.676 m), weight 88.905 kg (196 lb), SpO2 100 %.Body mass index is 31.65 kg/(m^2).  General Appearance: Casual  Eye Contact::  Good  Speech:  Pressured  Volume:  Increased  Mood:  Anxious, Euphoric and Irritable  Affect:  Labile   Thought Process:  Disorganized and Loose  Orientation:  Full (Time, Place, and Person)  Thought Content:  Ideas of Reference:   Paranoia and Paranoid Ideation  Suicidal Thoughts:  No  Homicidal Thoughts:  No  Memory:  Immediate;   Fair Recent;   Good Remote;   Fair  Judgement:  Impaired  Insight:  Lacking  Psychomotor Activity:  Decreased  Concentration:  Poor  Recall:  Poor  Fund of Knowledge:Poor  Language: Poor  Akathisia:  No  Handed:  Right  AIMS (if indicated):     Assets:  Armed forces logistics/support/administrative officer Housing Physical Health  ADL's:  Intact  Cognition: WNL  Sleep:      Treatment Plan Summary: Daily contact with patient to assess and evaluate symptoms and progress in treatment, Medication management and Plan Patient presents as very psychotic with  labile mood and agitation. Probably noncompliant with treatment but also without any real insight. Patient is argumentative and still very confused in his thinking. I don't think that he showing any safe ability to take care of himself. High risk of dangerous behavior in the community. I will continue involuntary commitment and admit him to the hospital. Continue Zyprexa 10 mg twice a day higher dose necessary for agitation. Case reviewed with emergency room staff. Unclear when we will have a bed but as soon as we do we will put him in line for admission.  Disposition: Recommend psychiatric Inpatient admission when medically cleared.  John Clapacs 04/14/2015 3:42 PM

## 2015-04-14 NOTE — ED Notes (Signed)
Pt. Noted in room lying in bed. No complaints or concerns voiced. No distress or abnormal behavior noted. Will continue to monitor with security cameras. Q 15 minute rounds continue.

## 2015-04-14 NOTE — BH Assessment (Signed)
Assessment Note  Benjamin Braun is a 49 y.o. male presenting to the ED  under IVC for paranoia, hyperreligiosity, and flight of ideas.  According to the IVC, he called a nonemergency line with no specific complaint. On police arrival, he was paranoid that his neighbors were coming in to his home and stealing from him. He started talking about the fall of the twin towers and wanting to see "[place] die".   Pt denies any SI or HI.  Pt also denies any drug/alcohol use.  Diagnosis: Schizophrenia  Past Medical History:  Past Medical History  Diagnosis Date  . Schizophrenia ([place])   . Generalized anxiety disorder   . Anxiety     Past Surgical History  Procedure Laterality Date  . Tonsillectomy      Family History:  Family History  Problem Relation Age of Onset  . Family history unknown: Yes    Social History:  reports that he quit smoking about 15 years ago. His smoking use included Cigarettes. He has never used smokeless tobacco. He reports that he does not drink alcohol or use illicit drugs.  Additional Social History:  Alcohol / Drug Use History of alcohol / drug use?: No history of alcohol / drug abuse (Pt denies)  CIWA: CIWA-Ar BP: 125/87 mmHg Pulse Rate: (!) 111 COWS:    Allergies: No Known Allergies  Home Medications:  (Not in a hospital admission)  OB/GYN Status:  No LMP for male patient.  General Assessment Data Location of Assessment: [facility] ED TTS Assessment: In system Is this a Tele or Face-to-Face Assessment?: Face-to-Face Is this an Initial Assessment or a Re-assessment for this encounter?: Initial Assessment Marital status: Single Maiden name: N/A Is patient pregnant?: No Pregnancy Status: No Living Arrangements: Alone Can pt return to current living arrangement?: Yes Admission Status: Involuntary Is patient capable of signing voluntary admission?: No Referral Source: Self/Family/Friend Insurance type: Medicare  Medical Screening Exam ([place]) Medical Exam completed: Yes  Crisis Care Plan Living Arrangements: Alone Name of Psychiatrist: N/A Name of Therapist: N/A  Education Status Is patient currently in school?: No Current Grade: N/A Highest grade of school patient has completed: N/A Name of school: N/a Contact person: N/A  Risk to self with the past 6 months Suicidal Ideation: No Has patient been a risk to self within the past 6 months prior to admission? : No Suicidal Intent: No Has patient had any suicidal intent within the past 6 months prior to admission? : No Is patient at risk for suicide?: No Suicidal Plan?: No Has patient had any suicidal plan within the past 6 months prior to admission? : No Access to Means: No What has been your use of drugs/alcohol within the last 12 months?: Pt denies Previous Attempts/Gestures: No How many times?: 0 Other Self Harm Risks: None reported Triggers for Past Attempts: None known Intentional Self Injurious Behavior: None Family Suicide History: Unknown Persecutory voices/beliefs?: No Depression: No Substance abuse history and/or treatment for substance abuse?: No Suicide prevention information given to non-admitted patients: Not applicable  Risk to Others within the past 6 months Homicidal Ideation: No Does patient have any lifetime risk of violence toward others beyond the six months prior to admission? : No Thoughts of Harm to Others: No Current Homicidal Intent: No Current Homicidal Plan: No Access to Homicidal Means: No Identified Victim: None reported History of harm to others?: No Assessment of Violence: None Noted Violent Behavior Description: None reported Does patient have access to weapons?:  No Criminal Charges Pending?: No Does patient have a court date: No Is patient on probation?: No  Psychosis Hallucinations: Auditory Delusions: None noted  Mental Status Report Appearance/Hygiene: Unremarkable Eye Contact: Good Motor Activity:  Freedom of movement, Unremarkable Speech: Incoherent Level of Consciousness: Alert Mood: Euphoric Affect: Euphoric Anxiety Level: None Thought Processes: Flight of Ideas Judgement: Partial Orientation: Person Obsessive Compulsive Thoughts/Behaviors: None  Cognitive Functioning Concentration: Fair Memory: Recent Impaired IQ: Average Insight: Poor Impulse Control: Poor Appetite: Fair Weight Loss: 0 Weight Gain: 0 Sleep: Unable to Assess Vegetative Symptoms: None  ADLScreening [facility] Assessment Services) Patient's cognitive ability adequate to safely complete daily activities?: Yes Patient able to express need for assistance with ADLs?: Yes Independently performs ADLs?: Yes (appropriate for developmental age)  Prior Inpatient Therapy Prior Inpatient Therapy: No Prior Therapy Dates: N/A Prior Therapy Facilty/Provider(s): N/A Reason for Treatment: N/a  Prior Outpatient Therapy Prior Outpatient Therapy: No Prior Therapy Dates: N/A Prior Therapy Facilty/Provider(s): N/a Reason for Treatment: N/a Does patient have an ACCT team?: Unknown Does patient have Intensive In-House Services?  : No Does patient have [facility] services? : Unknown Does patient have P4CC services?: Unknown  ADL Screening (condition at time of admission) Patient's cognitive ability adequate to safely complete daily activities?: Yes Patient able to express need for assistance with ADLs?: Yes Independently performs ADLs?: Yes (appropriate for developmental age)       Abuse/Neglect Assessment (Assessment to be complete while patient is alone) Physical Abuse: Denies Verbal Abuse: Denies Sexual Abuse: Denies Exploitation of patient/patient's resources: Denies Self-Neglect: Denies Values / Beliefs Cultural Requests During Hospitalization: None Spiritual Requests During Hospitalization: None Consults Spiritual Care Consult Needed: No Social Work Consult Needed: No Regulatory affairs officer (For  Healthcare) Does patient have an advance directive?: No    Additional Information 1:1 In Past 12 Months?: No CIRT Risk: No Elopement Risk: No Does patient have medical clearance?: No     Disposition:  Disposition Initial Assessment Completed for this Encounter: Yes Disposition of Patient: Other dispositions Other disposition(s): Other (Comment) (Psych MD consult)  On Site Evaluation by:   Reviewed with Physician:    Vyolet Sakuma C Aily Tzeng 04/14/2015 12:04 AM

## 2015-04-15 ENCOUNTER — Inpatient Hospital Stay
Admit: 2015-04-15 | Discharge: 2015-04-20 | DRG: 885 | Disposition: A | Discharge status: Home / Self Care | Payer: Medicare HMO | Source: Intra-hospital | Attending: Psychiatry | Admitting: Psychiatry

## 2015-04-15 ENCOUNTER — Encounter: Payer: Self-pay | Admitting: *Deleted

## 2015-04-15 DIAGNOSIS — Z79899 Other long term (current) drug therapy: Secondary | ICD-10-CM

## 2015-04-15 DIAGNOSIS — F329 Major depressive disorder, single episode, unspecified: Secondary | ICD-10-CM | POA: Diagnosis present

## 2015-04-15 DIAGNOSIS — Z9889 Other specified postprocedural states: Secondary | ICD-10-CM

## 2015-04-15 DIAGNOSIS — Z9114 Patient's other noncompliance with medication regimen: Secondary | ICD-10-CM | POA: Diagnosis not present

## 2015-04-15 DIAGNOSIS — Z87891 Personal history of nicotine dependence: Secondary | ICD-10-CM | POA: Diagnosis not present

## 2015-04-15 DIAGNOSIS — F200 Paranoid schizophrenia: Secondary | ICD-10-CM | POA: Diagnosis present

## 2015-04-15 DIAGNOSIS — G4700 Insomnia, unspecified: Secondary | ICD-10-CM | POA: Diagnosis present

## 2015-04-15 DIAGNOSIS — Z9119 Patient's noncompliance with other medical treatment and regimen: Secondary | ICD-10-CM

## 2015-04-15 DIAGNOSIS — F411 Generalized anxiety disorder: Secondary | ICD-10-CM | POA: Diagnosis present

## 2015-04-15 DIAGNOSIS — F209 Schizophrenia, unspecified: Secondary | ICD-10-CM | POA: Diagnosis not present

## 2015-04-15 DIAGNOSIS — E119 Type 2 diabetes mellitus without complications: Secondary | ICD-10-CM | POA: Diagnosis present

## 2015-04-15 DIAGNOSIS — E8881 Metabolic syndrome: Secondary | ICD-10-CM | POA: Diagnosis present

## 2015-04-15 DIAGNOSIS — F410 Panic disorder [episodic paroxysmal anxiety] without agoraphobia: Secondary | ICD-10-CM | POA: Diagnosis present

## 2015-04-15 LAB — LIPID PANEL
CHOL/HDL RATIO: 6.8 ratio
CHOLESTEROL: 246 mg/dL — AB (ref 0–200)
HDL: 36 mg/dL — AB (ref >40)
LDL Cholesterol: 132 mg/dL — ABNORMAL HIGH (ref 0–99)
Triglycerides: 389 mg/dL — ABNORMAL HIGH (ref <150)
VLDL: 78 mg/dL — AB (ref 0–40)

## 2015-04-15 LAB — TSH: TSH: 0.952 u[IU]/mL (ref 0.350–4.500)

## 2015-04-15 LAB — HEMOGLOBIN A1C: Hgb A1c MFr Bld: 5.3 % (ref 4.0–6.0)

## 2015-04-15 MED ORDER — ACETAMINOPHEN 325 MG PO TABS: 650.0000 mg | ORAL_TABLET | Freq: Four times a day (QID) | ORAL | Status: DC | PRN

## 2015-04-15 MED ORDER — ALUM & MAG HYDROXIDE-SIMETH 200-200-20 MG/5ML PO SUSP: 30.0000 mL | ORAL | Status: DC | PRN

## 2015-04-15 MED ORDER — MAGNESIUM HYDROXIDE 400 MG/5ML PO SUSP: 30.0000 mL | Freq: Every day | ORAL | Status: DC | PRN

## 2015-04-15 MED ORDER — OLANZAPINE 10 MG PO TABS
10.0000 mg | ORAL_TABLET | Freq: Two times a day (BID) | ORAL | Status: DC
  Administered 2015-04-15 – 2015-04-19 (×8): 10 mg via ORAL
  Filled 2015-04-15 (×8): qty 1

## 2015-04-15 NOTE — ED Notes (Signed)
Pt. Noted in room sleeping;. No complaints or concerns voiced. No distress or abnormal behavior noted. Will continue to monitor with security cameras. Q 15 minute rounds continue. 

## 2015-04-15 NOTE — BHH Group Notes (Signed)
[place] LCSW Group Therapy  04/15/2015 2:18 PM  Type of Therapy:  Group Therapy  Participation Level:  Did Not Attend  Modes of Intervention:  Discussion, Education, Socialization and Support  Summary of Progress/Problems: Todays topic: Grudges  Patients will be encouraged to discuss their thoughts, feelings, and behaviors as to why one holds on to grudges and reasons why people have grudges. Patients will process the impact of grudges on their daily lives and identify thoughts and feelings related to holding grudges. Patients will identify feelings and thoughts related to what life would look like without grudges.   [place] MSW, LCSWA  04/15/2015, 2:18 PM
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
## 2015-04-15 NOTE — ED Notes (Signed)
Pt. Noted in room resting quietly;. No complaints or concerns voiced. No distress or abnormal behavior noted. Will continue to monitor with security cameras. Q 15 minute rounds continue. 

## 2015-04-15 NOTE — ED Provider Notes (Signed)
-----------------------------------------   7:00 AM on 04/15/2015 -----------------------------------------   Blood pressure 128/90, pulse 112, temperature 97.4 F (36.3 C), temperature source Oral, resp. rate 20, height 5\' 6"  (1.676 m), weight 196 lb (88.905 kg), SpO2 98 %.  The patient had no acute events since last update.  Calm and cooperative at this time.  Disposition is pending per Psychiatry/Behavioral Medicine team recommendations.     Paulette Blanch, MD 04/15/15 0700

## 2015-04-15 NOTE — Tx Team (Signed)
Initial Interdisciplinary Treatment Plan   PATIENT STRESSORS: Legal issue Medication change or noncompliance   PATIENT STRENGTHS: Curator fund of knowledge   PROBLEM LIST: Problem List/Patient Goals Date to be addressed Date deferred Reason deferred Estimated date of resolution  Psychosis      Anxiety                                                 DISCHARGE CRITERIA:  Ability to meet basic life and health needs Improved stabilization in mood, thinking, and/or behavior  PRELIMINARY DISCHARGE PLAN: Outpatient therapy Return to previous living arrangement  PATIENT/FAMIILY INVOLVEMENT: This treatment plan has been presented to and reviewed with the patient, Benjamin Braun. The patient and family have been given the opportunity to ask questions and make suggestions.  Elisea Khader, Severiano Gilbert 04/15/2015, 2:54 PM

## 2015-04-15 NOTE — Plan of Care (Signed)
Problem: Alteration in thought process Goal: STG-Patient is able to discuss thoughts with staff Outcome: Not Progressing Patient angry about being in hospital and denies complaints except that the police brought him here because he called 9-1-1 to frequently.

## 2015-04-16 DIAGNOSIS — F200 Paranoid schizophrenia: Principal | ICD-10-CM

## 2015-04-16 MED ORDER — IBUPROFEN 600 MG PO TABS
600.0000 mg | ORAL_TABLET | Freq: Four times a day (QID) | ORAL | Status: DC | PRN
  Administered 2015-04-16 – 2015-04-18 (×3): 600 mg via ORAL
  Filled 2015-04-16 (×3): qty 1

## 2015-04-16 MED ORDER — CLONAZEPAM 0.5 MG PO TABS
0.2500 mg | ORAL_TABLET | Freq: Two times a day (BID) | ORAL | Status: DC
  Administered 2015-04-16 – 2015-04-19 (×7): 0.25 mg via ORAL
  Filled 2015-04-16 (×8): qty 1

## 2015-04-16 MED ORDER — HYDROXYZINE HCL 50 MG PO TABS
50.0000 mg | ORAL_TABLET | Freq: Every day | ORAL | Status: DC
  Administered 2015-04-16 – 2015-04-19 (×4): 50 mg via ORAL
  Filled 2015-04-16 (×4): qty 1

## 2015-04-16 NOTE — Plan of Care (Signed)
Problem: Alteration in thought process Goal: LTG-Patient verbalizes understanding importance med regimen (Patient verbalizes understanding of importance of medication regimen and need to continue outpatient care.)  Outcome: Progressing Compliant with meds.

## 2015-04-16 NOTE — Plan of Care (Signed)
Problem: Alteration in thought process Goal: LTG-Patient has not harmed self or others in at least 2 days Outcome: Progressing Pt denies self harm thoughts, states that he never thought of hurting himself PTA or since admission

## 2015-04-16 NOTE — BHH Group Notes (Signed)
[place] LCSW Group Therapy  04/16/2015 2:00 PM  Type of Therapy:  Group Therapy  Participation Level:  Did Not Attend  Modes of Intervention:  Discussion, Education, Socialization and Support  Summary of Progress/Problems: Balance in life: Patients will discuss the concept of balance and how it looks and feels to be unbalanced. Pt will identify areas in their life that is unbalanced and ways to become more balanced.    [place] MSW, LCSWA  04/16/2015, 2:00 PM
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
## 2015-04-16 NOTE — Progress Notes (Signed)
D:  Pt woke up irritable, c/o chronic back pain, angry that he is here in the hospital, states that the only reason he had to come here is "because I called 911 because a pitbull attacked me", pt bounces from one subject to the next, states that he just slept for 19 hours because "y'all increased my dosage, how and I supposed to go to the groc[employer] store on my bike, my hand itch", denies SI/HI/AVH.  A:  Emotional support provided, MD notified of patient complaining of pain and refusal of tylenol, Encouraged pt to continue with treatment plan and attend all group activities, q15 min checks maintained for safety.  R:  Pt is irritable, needs frequent redirection, pt states that the ibuprofen didn't help much for his back pain but refuses to take tylenol for pain, pt currently in room laying down in bed.

## 2015-04-17 ENCOUNTER — Telehealth: Payer: Self-pay | Admitting: Psychiatry

## 2015-04-17 ENCOUNTER — Encounter: Payer: Self-pay | Admitting: Psychiatry

## 2015-04-17 MED ORDER — TRAZODONE HCL 100 MG PO TABS
100.0000 mg | ORAL_TABLET | Freq: Every day | ORAL | Status: DC
  Administered 2015-04-17 – 2015-04-19 (×3): 100 mg via ORAL
  Filled 2015-04-17 (×3): qty 1

## 2015-04-17 MED ORDER — DIPHENHYDRAMINE HCL 25 MG PO CAPS
25.0000 mg | ORAL_CAPSULE | Freq: Once | ORAL | Status: AC
  Administered 2015-04-17: 25 mg via ORAL
  Filled 2015-04-17: qty 1

## 2015-04-17 NOTE — Progress Notes (Signed)
Recreation Therapy Notes  INPATIENT RECREATION THERAPY ASSESSMENT  Patient Details Name: Benjamin Braun MRN: [id number] DOB: [birth year] Today's Date: 05/02/15  Patient Stressors: Death, Other (Comment) (3 people have recently passed away; being in the hospital, fish that are at home)  Coping Skills:   Isolate, Arguments, Exercise, Art/Dance, Talking, Music, Sports, Other (Comment) (Lay down)  Personal Challenges: Trusting Others (Thinks people are stealing from him)  Leisure Interests (2+):  Individual - Other (Comment) (Feed fish, play with puppy)  Awareness of Community Resources:  Yes  Community Resources:  Gym  Current Use: No  If no, Barriers?: Other (Comment) (Does not want to leave because he is worried people are going to steal his stuff)  Patient Strengths:  Does no break the 10 commandments, no transgressions, sins are covered by the lamb  Patient Identified Areas of Improvement:  Going home  Current Recreation Participation:  Set up fish tanks  Patient Goal for Hospitalization:  To find a way home  [place] of Residence:  [place] of Residence:  [place]   Current SI (including self-harm):  No  Current HI:  No  Consent to Intern Participation: N/A  LRT will not develop a Recreational Therapy Care Plan as patient does not require Recreational Therapy Services at this time. If patient's status changes, LRT will develop a Recreational Therapy Care Plan.  Leonette Monarch, LRT/CTRS May 02, 2015, 4:59 PM

## 2015-04-17 NOTE — Progress Notes (Signed)
D: Patient denies SI/HI/AVH.  Patient affect and mood are anxious.  Patient was upset that the MD did not order Xanax for sleep.  Patient stated, "She told me she was going to order it.  She lied!"  Patient did NOT attend evening group. Patient visible on the milieu. No distress noted. A: Support and encouragement offered. Scheduled medications given to pt. Q 15 min checks continued for patient safety. R: Patient receptive. Patient remains safe on the unit.

## 2015-04-17 NOTE — Progress Notes (Signed)
D: patient irritable at times this shift.  Patient seen and heard several times walking and pacing the milieu talking bizarre thoughts and words.  Patient cooperative with medications.  Patient asked to leave groups because of inappropriate talk.  Patient denies any SI at this time.  Patient very hard of hearing.  Patients dress is unremarkable.  Patient is presenting with a flat affect.  Patient in no distress at this time.  A: patient given medications as prescribed.  Support and encouragement provided.  q 15 min checks done.   R: patient seems to be receptive of information given but needs reinforcement and redirection at times.

## 2015-04-17 NOTE — BHH Group Notes (Signed)
[place] Group Notes:  (Nursing/MHT/Case Management/Adjunct)  Date:  04/17/2015  Time:  1:40 PM  Type of Therapy:  Psychoeducational Skills  Participation Level:  Did Not Attend   Celso Amy 04/17/2015, 1:40 PM
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
## 2015-04-17 NOTE — Progress Notes (Signed)
Recreation Therapy Notes  Date: 11.07.16 Time: 3:00 pm Location: Craft Room  Group Topic: Self-expression  Goal Area(s) Addresses:  Patient will identify one color per emotion listed on wheel. Patient will verbalize benefit of using art as a means of self-expression. Patient will verbalize one emotion experienced during session. Patient will be educated on other forms of self-expression.  Behavioral Response: Did not attend  Intervention: Emotion Wheel  Activity: Patients were given an emotion wheel worksheet and instructed to pick a color for each emotion on the worksheet.    Education: LRT educated patients on different forms of self-expression.  Education Outcome: Patient did not attend group.  Clinical Observations/Feedback: Patient did not attend group.  Leonette Monarch, LRT/CTRS 04/17/2015 4:03 PM

## 2015-04-17 NOTE — Plan of Care (Signed)
Problem: Alteration in thought process Goal: STG-Patient is able to follow short directions Outcome: Progressing Pt. Is HOH but able to follow short directions. Pt. Is also able to verbalize his needs to nursing staff.

## 2015-04-17 NOTE — Progress Notes (Addendum)
[facility] MD Progress Note  04/17/2015 2:32 PM Benjamin Braun  MRN:  [id number]  Subjective:  Benjamin Braun continues to be psychotic and religiously preoccupied. He is irritable and rather rude. He denies any symptoms of depression, anxiety or psychosis. He is convinced that his neighbors are out to get him. He accepts medications in the hospital. There are no somatic symptoms. His sleep is poor. Appetite is fair. He tries to anticipate in programming.  Principal Problem: Schizophrenia, paranoid ([place]) Diagnosis:   Patient Active Problem List   Diagnosis Date Noted  . Schizophrenia, paranoid ([place]) [F20.0] 04/14/2015  . Diabetes ([place]) [E11.9] 04/14/2015  . Noncompliance [Z91.19] 04/14/2015   Total Time spent with patient: 20 minutes  Past Psychiatric History: schizophrenia.  Past Medical History:  Past Medical History  Diagnosis Date  . Schizophrenia ([place])   . Generalized anxiety disorder   . Anxiety     Past Surgical History  Procedure Laterality Date  . Tonsillectomy     Family History:  Family History  Problem Relation Age of Onset  . Family history unknown: Yes   Family Psychiatric  History: nonreported. Social History:  History  Alcohol Use No     History  Drug Use No    Social History   Social History  . Marital Status: Single    Spouse Name: N/A  . Number of Children: N/A  . Years of Education: N/A   Social History Main Topics  . Smoking status: Former Smoker    Types: Cigarettes    Quit date: 06/11/1999  . Smokeless tobacco: Never Used  . Alcohol Use: No  . Drug Use: No  . Sexual Activity: No   Other Topics Concern  . None   Social History Narrative   Additional Social History:                         Sleep: Poor  Appetite:  Fair  Current Medications: Current Facility-Administered Medications  Medication Dose Route Frequency Provider Last Rate Last Dose  . acetaminophen (TYLENOL) tablet 650 mg  650 mg Oral Q6H PRN Gonzella Lex, MD       . alum & mag hydroxide-simeth (MAALOX/MYLANTA) 200-200-20 MG/5ML suspension 30 mL  30 mL Oral Q4H PRN Gonzella Lex, MD      . clonazePAM Bobbye Charleston) tablet 0.25 mg  0.25 mg Oral BID Rainey Pines, MD   0.25 mg at 04/17/15 0954  . hydrOXYzine (ATARAX/VISTARIL) tablet 50 mg  50 mg Oral QHS Rainey Pines, MD   50 mg at 04/16/15 2206  . ibuprofen (ADVIL,MOTRIN) tablet 600 mg  600 mg Oral Q6H PRN Rainey Pines, MD   600 mg at 04/16/15 0911  . magnesium hydroxide (MILK OF MAGNESIA) suspension 30 mL  30 mL Oral Daily PRN Gonzella Lex, MD      . OLANZapine (ZYPREXA) tablet 10 mg  10 mg Oral BID AC Gonzella Lex, MD   10 mg at 04/17/15 7124    Lab Results: No results found for this or any previous visit (from the past 45 hour(s)).  Physical Findings: AIMS: Facial and Oral Movements Muscles of Facial Expression: None, normal Lips and Perioral Area: None, normal Jaw: None, normal Tongue: None, normal,Extremity Movements Upper (arms, wrists, hands, fingers): None, normal Lower (legs, knees, ankles, toes): None, normal, Trunk Movements Neck, shoulders, hips: None, normal, Overall Severity Severity of abnormal movements (highest score from questions above): None, normal Incapacitation due to abnormal movements: None,  normal Patient's awareness of abnormal movements (rate only patient's report): No Awareness, Dental Status Current problems with teeth and/or dentures?: No Does patient usually wear dentures?: No  CIWA:    COWS:     Musculoskeletal: Strength & Muscle Tone: within normal limits Gait & Station: normal Patient leans: N/A  Psychiatric Specialty Exam: Review of Systems  Psychiatric/Behavioral: Positive for hallucinations. The patient has insomnia.   All other systems reviewed and are negative.   Blood pressure 137/92, pulse 93, temperature 97.5 F (36.4 C), temperature source Oral, resp. rate 20, height 5\' 6"  (1.676 m), weight 87.998 kg (194 lb), SpO2 99 %.Body mass index is 31.33  kg/(m^2).  General Appearance: Disheveled  Eye Sport and exercise psychologist::  Fair  Speech:  Pressured  Volume:  Increased  Mood:  Angry, Dysphoric and Irritable  Affect:  Labile  Thought Process:  Disorganized  Orientation:  Full (Time, Place, and Person)  Thought Content:  Delusions and Paranoid Ideation  Suicidal Thoughts:  No  Homicidal Thoughts:  No  Memory:  Immediate;   Fair Recent;   Fair Remote;   Fair  Judgement:  Impaired  Insight:  Shallow  Psychomotor Activity:  Increased  Concentration:  Fair  Recall:  [employer] of Knowledge:Fair  Language: Fair  Akathisia:  No  Handed:  Right  AIMS (if indicated):     Assets:  Communication Skills Desire for Improvement Housing Resilience  ADL's:  Intact  Cognition: WNL  Sleep:  Number of Hours: 4   Treatment Plan Summary: Daily contact with patient to assess and evaluate symptoms and progress in treatment and Medication management   Benjamin Braun is a 49 year old male with a history of schizophrenia meeting in a psychotic episode in the context of medication noncompliance  .1. Psychosis. He was restarted on Zyprexa and dose was increased to 10 mg twice daily for psychosis and mood stabilization.  2. Anxiety. He is on low-dose Klonopin and hydroxyzine.  3. Insomnia. We will start trazodone at bedtime.  4. Disposition. She will be discharged back to home and follow up with Dr. Jimmye Norman. I contacted his office to make them aware of current admission. The patient insists on being discharge as he left the dog and exotic fish at home unattended.  Benjamin Braun 04/17/2015, 2:32 PM
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
## 2015-04-18 NOTE — Progress Notes (Signed)
Pt is loud and irritable. Tangential with FOI. Isolates in room. When approached, states he needs new hearing aids. Med compliant. Attends group. Denies SI/HI/AV/H. No c/o pain/discomfort noted.

## 2015-04-18 NOTE — Plan of Care (Signed)
Problem: Ineffective individual coping Goal: STG-Increase in ability to manage activities of daily living Outcome: Progressing Patient maintaining hygiene. Eating meals.

## 2015-04-18 NOTE — Plan of Care (Signed)
Problem: Ineffective individual coping Goal: STG: Patient will remain free from self harm Outcome: Not Applicable Date Met:  64/40/34 Med compliant. No injuries noted. No voiced thoughts of hurting himself. q 15 min checks maintained.

## 2015-04-18 NOTE — BHH Group Notes (Signed)
[facility] Group Notes:  (Nursing/MHT/Case Management/Adjunct)  Date:  04/18/2015  Time:  2:06 PM  Type of Therapy:  Psychoeducational Skills  Participation Level:  Did Not Attend  Celso Amy 04/18/2015, 2:06 PM

## 2015-04-18 NOTE — Progress Notes (Signed)
[facility] MD Progress Note  04/18/2015 3:22 PM DUELL HOLDREN  MRN:  [id number]  Subjective:  Benjamin Braun is still irritable, even temperament, intrusive, and labile. He complains that he is given too much medications. I spoke extensively with Dr. Jimmye Norman, his primarypsychiatrist. The patient regularly asks to decrease his dose of Zyprexa. He most likely was noncompliant with treatment even though he assures me that he was taking his medications as prescribed. He is irritable in groups and unkind to staff. He is still focused on the safety of his animals at home and very paranoid about his neighbors. He has very poor insight into his problems.  Principal Problem: Schizophrenia, paranoid ([place]) Diagnosis:   Patient Active Problem List   Diagnosis Date Noted  . Schizophrenia, paranoid ([place]) [F20.0] 04/14/2015  . Diabetes ([place]) [E11.9] 04/14/2015  . Noncompliance [Z91.19] 04/14/2015   Total Time spent with patient: 20 minutes  Past Psychiatric History: Schizophrenia.  Past Medical History:  Past Medical History  Diagnosis Date  . Schizophrenia ([place])   . Generalized anxiety disorder   . Anxiety     Past Surgical History  Procedure Laterality Date  . Tonsillectomy     Family History:  Family History  Problem Relation Age of Onset  . Family history unknown: Yes   Family Psychiatric  History: None reported. Social History:  History  Alcohol Use No     History  Drug Use No    Social History   Social History  . Marital Status: Single    Spouse Name: N/A  . Number of Children: N/A  . Years of Education: N/A   Social History Main Topics  . Smoking status: Former Smoker    Types: Cigarettes    Quit date: 06/11/1999  . Smokeless tobacco: Never Used  . Alcohol Use: No  . Drug Use: No  . Sexual Activity: No   Other Topics Concern  . None   Social History Narrative   Additional Social History:                         Sleep: Fair  Appetite:  Good  Current  Medications: Current Facility-Administered Medications  Medication Dose Route Frequency Provider Last Rate Last Dose  . acetaminophen (TYLENOL) tablet 650 mg  650 mg Oral Q6H PRN Gonzella Lex, MD      . alum & mag hydroxide-simeth (MAALOX/MYLANTA) 200-200-20 MG/5ML suspension 30 mL  30 mL Oral Q4H PRN Gonzella Lex, MD      . clonazePAM Bobbye [place]) tablet 0.25 mg  0.25 mg Oral BID Rainey Pines, MD   0.25 mg at 04/18/15 0926  . hydrOXYzine (ATARAX/VISTARIL) tablet 50 mg  50 mg Oral QHS Rainey Pines, MD   50 mg at 04/17/15 2136  . ibuprofen (ADVIL,MOTRIN) tablet 600 mg  600 mg Oral Q6H PRN Rainey Pines, MD   600 mg at 04/16/15 0911  . magnesium hydroxide (MILK OF MAGNESIA) suspension 30 mL  30 mL Oral Daily PRN Gonzella Lex, MD      . OLANZapine (ZYPREXA) tablet 10 mg  10 mg Oral BID AC Gonzella Lex, MD   10 mg at 04/18/15 0807  . traZODone (DESYREL) tablet 100 mg  100 mg Oral QHS Clovis Fredrickson, MD   100 mg at 04/17/15 2137    Lab Results: No results found for this or any previous visit (from the past 48 hour(s)).  Physical Findings: AIMS: Facial and Oral Movements Muscles of  Facial Expression: None, normal Lips and Perioral Area: None, normal Jaw: None, normal Tongue: None, normal,Extremity Movements Upper (arms, wrists, hands, fingers): None, normal Lower (legs, knees, ankles, toes): None, normal, Trunk Movements Neck, shoulders, hips: None, normal, Overall Severity Severity of abnormal movements (highest score from questions above): None, normal Incapacitation due to abnormal movements: None, normal Patient's awareness of abnormal movements (rate only patient's report): No Awareness, Dental Status Current problems with teeth and/or dentures?: No Does patient usually wear dentures?: No  CIWA:    COWS:     Musculoskeletal: Strength & Muscle Tone: within normal limits Gait & Station: normal Patient leans: N/A  Psychiatric Specialty Exam: Review of Systems   Psychiatric/Behavioral: The patient has insomnia.   All other systems reviewed and are negative.   Blood pressure 119/92, pulse 110, temperature 97.7 F (36.5 C), temperature source Oral, resp. rate 20, height 5\' 6"  (1.676 m), weight 87.998 kg (194 lb), SpO2 99 %.Body mass index is 31.33 kg/(m^2).  General Appearance: Disheveled  Eye Sport and exercise psychologist::  Fair  Speech:  Clear and Coherent  Volume:  Normal  Mood:  Angry, Anxious and Irritable  Affect:  Congruent  Thought Process:  Disorganized  Orientation:  Full (Time, Place, and Person)  Thought Content:  Delusions and Paranoid Ideation  Suicidal Thoughts:  No  Homicidal Thoughts:  No  Memory:  Immediate;   Fair Recent;   Fair Remote;   Fair  Judgement:  Impaired  Insight:  Shallow  Psychomotor Activity:  Normal  Concentration:  Fair  Recall:  [place]  Language: Fair  Akathisia:  No  Handed:  Right  AIMS (if indicated):     Assets:  Communication Skills Desire for Improvement Housing Physical Health Resilience Social Support  ADL's:  Intact  Cognition: WNL  Sleep:  Number of Hours: 6   Treatment Plan Summary: Daily contact with patient to assess and evaluate symptoms and progress in treatment and Medication management   [name] is a 49 year old male with a history of schizophrenia meeting in a psychotic episode in the context of medication noncompliance  .1. Psychosis. He was restarted on Zyprexa and dose was increased to 10 mg twice daily for psychosis and mood stabilization.  2. Anxiety. He is on low-dose Klonopin and hydroxyzine.  3. Insomnia. He slept 6 hours with trazodone.   4. Disposition. He will be discharged back to home and follow up with Dr. Jimmye Norman. Dr. Jimmye Norman suggested community support team to improve compliance and the decrease hospitalization. Will contact [facility].   [name] 04/18/2015, 3:22 PM

## 2015-04-18 NOTE — BHH Group Notes (Signed)
[place] LCSW Group Therapy  04/18/2015 2:01 PM  Type of Therapy:  Group Therapy  Participation Level:  Active  Participation Quality:  Intrusive and Redirectable  Affect:  Blunted and Labile  Cognitive:  Alert, Disorganized and Delusional  Insight:  Limited and Off Topic  Engagement in Therapy:  Limited and Off Topic  Modes of Intervention:  Exploration, Limit-setting and Socialization  Summary of Progress/Problems: Patient attended group and participated. Patient introduced himself and shared that 3 things he would take if her were to be stranded on an [place] would be "Jesus, pots & pans, and an axe". Patient elaborated that he would use his axe to chop trees and make tools. Patient was somewhat disorganized and off topic during group session and continues to share that he has lost 3 family members since August 2016 but does not appear to be grieving.   Keene Breath, MSW, LCSWA 04/18/2015, 2:01 PM

## 2015-04-18 NOTE — Progress Notes (Signed)
Recreation Therapy Notes  Date: 11.08.16 Time: 3:00 pm Location: Craft Room  Group Topic: Goal Setting  Goal Area(s) Addresses:  Patient will write down at least one goal. Patient will write down at least one obstacle.  Behavioral Response: Arrived late, Attentive  Intervention: Recovery Goal Chart  Activity: Patients were instructed to make a Recovery Goal Chart with goals, obstacles, the date they started working on their goals, and the date they achieved their goals.   Education: LRT educated patients on healthy ways they can celebrate reaching their goals.  Education Outcome: In group clarification offered  Clinical Observations/Feedback: Patient arrived to group at approximately 3:10 pm. Patient worked on activity. Patient did not contribute to group discussion.  Leonette Monarch, LRT/CTRS 04/18/2015 4:15 PM

## 2015-04-18 NOTE — Plan of Care (Signed)
Problem: Consults Goal: Psychosis Patient Education See Patient Education Module for education specifics.  Outcome: Progressing Pt is progressing on this goal, has not verbalized any hallucinations at the present.

## 2015-04-18 NOTE — Tx Team (Signed)
Interdisciplinary Treatment Plan Update (Adult)  Date:  04/18/2015 Time Reviewed:  1:55 PM  Progress in Treatment: Attending groups: Yes. Participating in groups:  Yes. However, patient is off topic and distracting but somewhat redirectable Taking medication as prescribed:  Yes. Tolerating medication:  Yes. Family/Significant othe contact made:  No, will contact:  if patient provides consent Patient understands diagnosis:  Yes. Discussing patient identified problems/goals with staff:  Yes. Medical problems stabilized or resolved:  Yes. Denies suicidal/homicidal ideation: Yes. Issues/concerns per patient self-inventory:  No. Other:  New problem(s) identified: No, Describe:  none reported  Discharge Plan or Barriers: Patient is paranoid and delusional and once stabilized will discharge home with followup care with [facility] Dr. Jimmye Norman and will need a referral to [facility] possibly through [place] once discharged home.  Reason for Continuation of Hospitalization: Delusions  Other; describe paranoia  Comments:  Estimated length of stay: up to 4 days expected discharge Friday 04/21/15  New goal(s):  Review of initial/current patient goals per problem list:   1.  Goal(s):partiicpate in care planning  Met:  No  Target date:by discharge  2.  Goal (s):paranoia will be manageable  Met:  No  Target date:by discharge  3.  Goal(s): delusions will be manageable  Met:  No  Target date:by discharge   Attendees: Physician:  Orson Slick, MD 11/8/20161:55 PM  Nursing:   Mordecai Rasmussen, RN 11/8/20161:55 PM  Other:  Carmell Austria, [place] 11/8/20161:55 PM  Other:   11/8/20161:55 PM  Other:   11/8/20161:55 PM  Other:  11/8/20161:55 PM  Other:  11/8/20161:55 PM  Other:  11/8/20161:55 PM  Other:  11/8/20161:55 PM  Other:  11/8/20161:55 PM  Other:  11/8/20161:55 PM  Other:   11/8/20161:55 PM   Scribe for Treatment Team:   Keene Breath, MSW, LCSWA  04/18/2015, 1:55 PM

## 2015-04-18 NOTE — Progress Notes (Signed)
D: Patient continues to be irritable and angry about being in the hospital. He is cooperative with taking meds but complains that he was over-medicated when he came to this unit and slept for 19 hours. He said that his Zyprexa should not have been started at as high a dose as it was and that he knows a psychiatrist in [place] who he is going to tell about this. He is not attending group. Denies feeling depressed or having SI/HI. When asked about auditory hallucinations, he said the Bible was full of people hearing voices, but he declined to discuss this further. Thoughts are rambling but fairly goal-directed. A: Offered emotional support. On 15 minute checks. Given meds. Encouraged participation in groups. R: Irritable. Restless at times.

## 2015-04-19 MED ORDER — CLONAZEPAM 1 MG PO TABS
1.0000 mg | ORAL_TABLET | Freq: Two times a day (BID) | ORAL | Status: DC
  Administered 2015-04-19 – 2015-04-20 (×2): 1 mg via ORAL
  Filled 2015-04-19 (×2): qty 1

## 2015-04-19 MED ORDER — OLANZAPINE 5 MG PO TBDP
10.0000 mg | ORAL_TABLET | Freq: Two times a day (BID) | ORAL | Status: DC
Start: 2015-04-19 — End: 2015-04-20
  Administered 2015-04-19 – 2015-04-20 (×2): 10 mg via ORAL
  Filled 2015-04-19 (×2): qty 2

## 2015-04-19 NOTE — Progress Notes (Signed)
Recreation Therapy Notes  Date: 11.09.16 Time: 3:00 pm Location: Craft Room  Group Topic: Self-esteem  Goal Area(s) Addresses:  Patients will write at least one positive trait about self.  Behavioral Response: Attentive, Disruptive  Intervention: I Am  Activity: Patients were given a worksheet with the letter I on it and instructed to list as many positive traits about themselves as they could.  Education:LRT educated patients on ways they can increase their self-esteem.  Education Outcome: In group clarification offered  Clinical Observations/Feedback: Patient wrote positive traits and colored his worksheet. Patient started talking about the sheriff and making jerking movement. LRT redirected patient. Patient complied.  Leonette Monarch, LRT/CTRS 04/19/2015 4:27 PM

## 2015-04-19 NOTE — Plan of Care (Signed)
Problem: Alteration in thought process Goal: STG-Patient is able to sleep at least 6 hours per night Outcome: Progressing Patient is sleeping at least 7 hours per night.

## 2015-04-19 NOTE — BHH Group Notes (Signed)
[facility] [facility] Group Notes:  (Nursing/MHT/Case Management/Adjunct)  Date:  04/19/2015  Time:  1:27 PM  Type of Therapy:  Psychoeducational Skills  Participation Level:  Active  Participation Quality:  Redirectable  Affect:  Appropriate  Cognitive:  Disorganized  Insight:  Good  Engagement in Group:  Engaged  Modes of Intervention:  Discussion, Education and Support  Summary of Progress/Problems:  Lorane Gell 04/19/2015, 1:27 PM

## 2015-04-19 NOTE — Progress Notes (Signed)
[facility] MD Progress Note  04/19/2015 2:03 PM Benjamin Braun  MRN:  [id number]  Subjective: Benjamin Braun is less agitated and irritable today. We were able to have a nice conversation. He worries about his exotic plants, exotic fish, puppy dog and close that he left in the washer that are more now. He is calling collected. He still complains that the dose of Zyprexa is too high but has been taking it. I switched to Zyprexa Zydis just to make sure. He complains of severe anxiety and interrupted sleep. The dose of clonazepam was lowered by admitting psychiatrist. Burnis Medin increase clonazepam.   Principal Problem: Schizophrenia, paranoid ([place]) Diagnosis:   Patient Active Problem List   Diagnosis Date Noted  . Schizophrenia, paranoid ([place]) [F20.0] 04/14/2015  . Diabetes ([place]) [E11.9] 04/14/2015  . Noncompliance [Z91.19] 04/14/2015   Total Time spent with patient: 20 minutes  Past Psychiatric History: Schizophrenia.  Past Medical History:  Past Medical History  Diagnosis Date  . Schizophrenia ([place])   . Generalized anxiety disorder   . Anxiety     Past Surgical History  Procedure Laterality Date  . Tonsillectomy     Family History:  Family History  Problem Relation Age of Onset  . Family history unknown: Yes   Family Psychiatric  History: None reported. Social History:  History  Alcohol Use No     History  Drug Use No    Social History   Social History  . Marital Status: Single    Spouse Name: N/A  . Number of Children: N/A  . Years of Education: N/A   Social History Main Topics  . Smoking status: Former Smoker    Types: Cigarettes    Quit date: 06/11/1999  . Smokeless tobacco: Never Used  . Alcohol Use: No  . Drug Use: No  . Sexual Activity: No   Other Topics Concern  . None   Social History Narrative   Additional Social History:                         Sleep: Fair  Appetite:  Fair  Current Medications: Current Facility-Administered Medications   Medication Dose Route Frequency Provider Last Rate Last Dose  . acetaminophen (TYLENOL) tablet 650 mg  650 mg Oral Q6H PRN Gonzella Lex, MD      . alum & mag hydroxide-simeth (MAALOX/MYLANTA) 200-200-20 MG/5ML suspension 30 mL  30 mL Oral Q4H PRN Gonzella Lex, MD      . clonazePAM (KLONOPIN) tablet 1 mg  1 mg Oral BID AC Maybree Riling B Chelsea Nusz, MD      . hydrOXYzine (ATARAX/VISTARIL) tablet 50 mg  50 mg Oral QHS Rainey Pines, MD   50 mg at 04/18/15 2150  . ibuprofen (ADVIL,MOTRIN) tablet 600 mg  600 mg Oral Q6H PRN Rainey Pines, MD   600 mg at 04/18/15 1608  . magnesium hydroxide (MILK OF MAGNESIA) suspension 30 mL  30 mL Oral Daily PRN Gonzella Lex, MD      . OLANZapine zydis (ZYPREXA) disintegrating tablet 10 mg  10 mg Oral BID AC & HS Reniyah Gootee B Aniesa Boback, MD      . traZODone (DESYREL) tablet 100 mg  100 mg Oral QHS Brandolyn Shortridge B Emryn Flanery, MD   100 mg at 04/18/15 2150    Lab Results: No results found for this or any previous visit (from the past 48 hour(s)).  Physical Findings: AIMS: Facial and Oral Movements Muscles of Facial Expression: None, normal Lips  and Perioral Area: None, normal Jaw: None, normal Tongue: None, normal,Extremity Movements Upper (arms, wrists, hands, fingers): None, normal Lower (legs, knees, ankles, toes): None, normal, Trunk Movements Neck, shoulders, hips: None, normal, Overall Severity Severity of abnormal movements (highest score from questions above): None, normal Incapacitation due to abnormal movements: None, normal Patient's awareness of abnormal movements (rate only patient's report): No Awareness, Dental Status Current problems with teeth and/or dentures?: No Does patient usually wear dentures?: No  CIWA:    COWS:     Musculoskeletal: Strength & Muscle Tone: within normal limits Gait & Station: normal Patient leans: N/A  Psychiatric Specialty Exam: Review of Systems  HENT: Positive for hearing loss.   All other systems reviewed and are  negative.   Blood pressure 117/81, pulse 91, temperature 97.6 F (36.4 C), temperature source Oral, resp. rate 20, height 5\' 6"  (1.676 m), weight 87.998 kg (194 lb), SpO2 99 %.Body mass index is 31.33 kg/(m^2).  General Appearance: Casual  Eye Contact::  Fair  Speech:  Clear and Coherent  Volume:  Increased  Mood:  Anxious  Affect:  Labile  Thought Process:  Disorganized  Orientation:  Full (Time, Place, and Person)  Thought Content:  Delusions and Paranoid Ideation  Suicidal Thoughts:  No  Homicidal Thoughts:  No  Memory:  Immediate;   Fair Recent;   Fair Remote;   Fair  Judgement:  Impaired  Insight:  Shallow  Psychomotor Activity:  Increased  Concentration:  Fair  Recall:  [place]: Fair  Akathisia:  No  Handed:  Right  AIMS (if indicated):     Assets:  Communication Skills Desire for Improvement Financial Resources/Insurance Housing Physical Health Resilience Social Support  ADL's:  Intact  Cognition: WNL  Sleep:  Number of Hours: 7.1   Treatment Plan Summary: Daily contact with patient to assess and evaluate symptoms and progress in treatment and Medication management   [name] is a 49 year old male with a history of schizophrenia meeting in a psychotic episode in the context of medication noncompliance  .1. Psychosis. He was restarted on Zyprexa and dose was increased to 10 mg twice daily for psychosis and mood stabilization. We changed to Zyprexa Zydis. The patient adamantly refuses injectable antipsychotics explaining that this is for beaten by the Bible.  2. Anxiety. We'll increase Klonopin to 1 mg twice daily as in the community. He has hydroxyzine available as well.   3. Insomnia. He slept 7 hours with trazodone.   4. Metabolic syndrome.Lipid panel is elevated with normal hemoglobin A1c and TSH. We will switch to low cholesterol diet.   5. Disposition. He will be discharged back to home and follow up with Dr. Jimmye Norman.  Dr. Jimmye Norman suggested community support team to improve compliance and the decrease hospitalization. Will contact [facility].  Job Holtsclaw 04/19/2015, 2:03 PM

## 2015-04-19 NOTE — BHH Group Notes (Signed)
[facility] LCSW Group Therapy  04/19/2015 5:07 PM  Type of Therapy:  Group Therapy  Participation Level:  Minimal  Participation Quality:  Intrusive  Affect:  Labile  Cognitive:  Disorganized and Delusional  Insight:  Limited  Engagement in Therapy:  Limited  Modes of Intervention:  Discussion, Education, Socialization and Support  Summary of Progress/Problems: Emotional Regulation: Patients will identify both negative and positive emotions. They will discuss emotions they have difficulty regulating and how they impact their lives. Patients will be asked to identify healthy coping skills to combat unhealthy reactions to negative emotions.   Benjamin Braun attended group but was intrusive, hyper religous and disruptive. He interrupted other patients and group leader. He was asked to leave group soon after it started.   [place] MSW, LCSWA  04/19/2015, 5:07 PM

## 2015-04-19 NOTE — BHH Group Notes (Signed)
[facility] LCSW Aftercare Discharge Planning Group Note   04/19/2015 5:11 PM  Participation Quality:  Minimal   Mood/Affect:  Labile  Depression Rating:  0  Anxiety Rating:  0  Thoughts of Suicide:  No Will you contract for safety?   NA  Current AVH:  Yes, He denies AVH but appeared to be responding to internal stimuli.   Plan for Discharge/Comments:  Pt was intrusive and disorganized. He had difficulties staying on topic and answering questions.   Transportation Means: bus   Supports: None   [place] MSW, [employer]

## 2015-04-19 NOTE — Progress Notes (Signed)
Pt is visible in milieu this PM, denies any SI/HI/VAH, euthymic mood/congruent affect. Pt remains compliant with medications, no concerns voiced, will continue to monitor for safety.

## 2015-04-19 NOTE — Progress Notes (Signed)
D: patient was pleasant this shift.  Patient was seen in the milieu several times.  Patient did attend groups.  Patient denies SI and HI at this time.  Patient denies auditory and visual hallucinations at this time.  Patient compliant with medications.  Patient in no distress at this time.   A: support and encouragement provided.  q 15 min checks done.  Medications given as prescribed.  R: patient receptive of information

## 2015-04-20 ENCOUNTER — Telehealth: Payer: Self-pay | Admitting: Psychiatry

## 2015-04-20 MED ORDER — TRAZODONE HCL 100 MG PO TABS: 100.0000 mg | ORAL_TABLET | Freq: Every day | ORAL | Status: DC

## 2015-04-20 MED ORDER — HYDROXYZINE HCL 50 MG PO TABS: 50.0000 mg | ORAL_TABLET | Freq: Every day | ORAL | Status: DC

## 2015-04-20 MED ORDER — OLANZAPINE 10 MG PO TBDP: 10.0000 mg | ORAL_TABLET | Freq: Every day | ORAL | Status: DC

## 2015-04-20 NOTE — Plan of Care (Signed)
Problem: Alteration in thought process Goal: LTG-Patient has not harmed self or others in at least 2 days Outcome: Progressing Patient denies any suicidal ideation during the shift. Goal: LTG-Patient behavior demonstrates decreased signs psychosis (Patient behavior demonstrates decreased signs of psychosis to the point the patient is safe to return home and continue treatment in an outpatient setting.)  Outcome: Progressing Patient denies any AVH during the shift.

## 2015-04-20 NOTE — Plan of Care (Signed)
Problem: Ineffective individual coping Goal: LTG: Patient will report a decrease in negative feelings Outcome: Progressing Positive thinking , attending unit programing

## 2015-04-20 NOTE — BHH Suicide Risk Assessment (Signed)
[place] INPATIENT:  Family/Significant Other Suicide Prevention Education  Suicide Prevention Education:  Patient Refusal for Family/Significant Other Suicide Prevention Education: The patient Benjamin Braun has refused to provide written consent for family/significant other to be provided Family/Significant Other Suicide Prevention Education during admission and/or prior to discharge.  Physician notified.  Keene Breath, MSW, LCSWA 04/20/2015, 2:13 PM

## 2015-04-20 NOTE — BHH Group Notes (Signed)
[facility] Group Notes:  (Nursing/MHT/Case Management/Adjunct)  Date:  04/20/2015  Time:  1:48 PM  Type of Therapy:  Psychoeducational Skills  Participation Level:  Did Not Attend  Deaundra Dupriest De'Chelle Windel Keziah 04/20/2015, 1:48 PM

## 2015-04-20 NOTE — Progress Notes (Signed)
D: Patient remains in scrubs , normal gait  Non parti cipatory with unit programing . Noted guarde with his personal information .A: Encourage patient to come to staff for any concerns or issues needing to be addressed . Instructions given on medication , verbalize understanding. Writer addressing issues R: Voice no other concerns , staff continue tor monitor .   D:Patient aware of discharge this shift . Patient returning home . Patient received all belonging locked up . Patient denies  Suicidal  And homicidal ideations  .  A: Writer instructed on discharge criteria  . Received  medication  and prescriptions   . Aware  Of follow up appointment . R: Patient left unit with no questions  Or concerns via taxi

## 2015-04-20 NOTE — BHH Suicide Risk Assessment (Signed)
[place] INPATIENT:  Family/Significant Other Suicide Prevention Education  Suicide Prevention Education:  Contact Attempts: Casmere Mainer (brother) [phone] has been identified by the patient as the family member/significant other with whom the patient will be residing, and identified as the person(s) who will aid the patient in the event of a mental health crisis.  With written consent from the patient, two attempts were made to provide suicide prevention education, prior to and/or following the patient's discharge.  We were unsuccessful in providing suicide prevention education.  A suicide education pamphlet was given to the patient to share with family/significant other.  Date and time of first attempt:04/20/15 2:30 PM Date and time of second attempt:04/20/15 4:29 PM  Keene Breath, MSW, LCSWA 04/20/2015, 2:29 PM

## 2015-04-20 NOTE — BHH Suicide Risk Assessment (Signed)
[facility] Discharge Suicide Risk Assessment   Demographic Factors:  Male, Caucasian and Living alone  Total Time spent with patient: 30 minutes  Musculoskeletal: Strength & Muscle Tone: within normal limits Gait & Station: normal Patient leans: N/A  Psychiatric Specialty Exam: Physical Exam  Nursing note and vitals reviewed.   Review of Systems  Psychiatric/Behavioral: The patient is nervous/anxious.   All other systems reviewed and are negative.   Blood pressure 124/84, pulse 89, temperature 98.2 F (36.8 C), temperature source Oral, resp. rate 20, height 5\' 6"  (1.676 m), weight 87.998 kg (194 lb), SpO2 99 %.Body mass index is 31.33 kg/(m^2).  General Appearance: Casual  Eye Contact::  Good  Speech:  Clear and [id number]  Volume:  Normal  Mood:  Euthymic  Affect:  Appropriate  Thought Process:  Goal Directed  Orientation:  Full (Time, Place, and Person)  Thought Content:  Delusions and Paranoid Ideation  Suicidal Thoughts:  No  Homicidal Thoughts:  No  Memory:  Immediate;   Fair Recent;   Fair Remote;   Fair  Judgement:  Poor  Insight:  Shallow  Psychomotor Activity:  Normal  Concentration:  Fair  Recall:  [place]  Language: Fair  Akathisia:  No  Handed:  Right  AIMS (if indicated):     Assets:  Communication Skills Desire for Improvement Financial Resources/Insurance Housing Physical Health Resilience Social Support  Sleep:  Number of Hours: 7.1  Cognition: WNL  ADL's:  Intact   Have you used any form of tobacco in the last 30 days? (Cigarettes, Smokeless Tobacco, Cigars, and/or Pipes): No  Has this patient used any form of tobacco in the last 30 days? (Cigarettes, Smokeless Tobacco, Cigars, and/or Pipes) No  Mental Status Per Nursing Assessment::   On Admission:     Current Mental Status by Physician: NA  Loss Factors: NA  Historical Factors: Impulsivity  Risk Reduction Factors:   Sense of responsibility to family, Positive  social support and Positive therapeutic relationship  Continued Clinical Symptoms:  Schizophrenia:   Paranoid or undifferentiated type  Cognitive Features That Contribute To Risk:  None    Suicide Risk:  Minimal: No identifiable suicidal ideation.  Patients presenting with no risk factors but with morbid ruminations; may be classified as minimal risk based on the severity of the depressive symptoms  Principal Problem: Schizophrenia, paranoid [facility]) Discharge Diagnoses:  Patient Active Problem List   Diagnosis Date Noted  . Schizophrenia, paranoid ([place]) [F20.0] 04/14/2015  . Noncompliance [Z91.19] 04/14/2015    Follow-up Information    Follow up with [facility] - Dr. Jimmye Norman  DO NOT PRINT - NOT COMPLETE.   Why:  For follow-up care   Contact information:   [address], [place] [address] Ph [phone] Fax [phone]       Plan Of Care/Follow-up recommendations:  Activity:  As tolerated. Diet:  Low sodium heart healthy. Other:  Keep follow-up appointments.  Is patient on multiple antipsychotic therapies at discharge:  No   Has Patient had three or more failed trials of antipsychotic monotherapy by history:  No  Recommended Plan for Multiple Antipsychotic Therapies: NA    Kagan Mutchler 04/20/2015, 11:35 AM

## 2015-04-20 NOTE — Progress Notes (Signed)
Patient alert oriented x3, pleasant, calm and cooperative during the shift. Patient denies any SI/HI/AVH during the shift. Patient was medication compliant during the shift. Patient displayed a flat affect during the shift. Patient monitored on Q 15 minute checks.

## 2015-04-20 NOTE — Progress Notes (Signed)
  [facility] Adult Case Management Discharge Plan :  Will you be returning to the same living situation after discharge:  Yes,  home  At discharge, do you have transportation home?: No. Patient is provided with a cab voucher as [employer] bus does not go close to patient's house in [place] you have the ability to pay for your medications: Yes,  patient has Medicaid and Medicare  Release of information consent forms completed and in the chart;  Patient's signature needed at discharge.  Patient to Follow up at: Follow-up Information    Follow up with [facility] - Dr. Jimmye Norman. Go on 05/01/2015.   Why:  For follow-up care appt Monday 05/01/15 at 3:15pm   Contact information:   [address], [place] [address] Ph [phone] Fax [phone]       Next level of care provider has access to [facility]  Patient denies SI/HI: Yes,  patient denies SI/HI    Safety Planning and Suicide Prevention discussed: Yes,  SPE discussed with patient and called patient's brother Quron Kelty [phone] but no answer  Have you used any form of tobacco in the last 30 days? (Cigarettes, Smokeless Tobacco, Cigars, and/or Pipes): No  Has patient been referred to the Quitline?: N/A patient is not a smoker  Carmell Austria T, MSW, LCSWA 04/20/2015, 2:27 PM

## 2015-04-20 NOTE — Progress Notes (Signed)
Recreation Therapy Notes  Date: 11.10.16 Time: 3:00 pm Location: Craft Room  Group Topic: Self-expression, Coping Skills  Goal Area(s) Addresses:  Patient will effectively use art as a means of self-expression. Patient will recognize positive benefit of self-expression. Patient will be able to identify one emotion experienced during group discussion. Patient will identify use of art as a coping skill.  Behavioral Response: Attentive, Disruptive  Intervention: Two Faces of Me  Activity: Patients were given a blank face worksheet and instructed to draw a line down the middle of the worksheet. On one side, patients were instructed to draw how they felt when they were admitted to the hospital and on the other side they were instructed to draw how they want to feel when they are d/c from the hospital.  Education: LRT educated patients on how art is a good Technical sales engineer.  Education Outcome: In group clarification offered  Clinical Observations/Feedback: Patient completed activity by drawing his face. LRT had to redirect patient for talking about things off topic during group activity. Patient compliant. Patient did not contribute to group discussion.   Leonette Monarch, LRT/CTRS 04/20/2015 4:12 PM

## 2015-04-20 NOTE — BHH Group Notes (Signed)
[facility] LCSW Group Therapy  04/20/2015 4:03 PM  Type of Therapy:  Group Therapy  Participation Level:  Did Not Attend   Keene Breath, MSW, LCSWA 04/20/2015, 4:03 PM

## 2015-04-20 NOTE — Discharge Summary (Signed)
Physician Discharge Summary Note  Patient:  Benjamin Braun is an 49 y.o., male MRN:  [id number] DOB:  1965/06/29 Patient phone:  [phone] (home)  Patient address:   [address] [place] [address],  Total Time spent with patient: 30 minutes  Date of Admission:  04/15/2015 Date of Discharge: 04/20/2015  Reason for Admission:  Psychotic break.  Patient is a 49 year old male with history of schizophrenia who was admitted due to making multiple calls to the [facility] Department. He was seen for initial assessment and records reviewed.  According to the patient he reported that he was brought here as he was living in the [place] and there is no lesion allowed in the [place]. He reported that the people are coming shooting in at his puppy and he cannot keep him safe. He then reported that there was up in portal tried to get him and he was going on his bike and then he has to call the police but then the dispatcher got mad at him. Patient was trying to explain to me different situations when he has been calling the law enforcement and they were getting upset with him. Patient continues to exhibit poor insight about his reasoning. He reported that next time if something will happen he will not all the law enforcement.   He stated that he follows with Dr. Jimmye Braun in outpatient and he has been prescribing him Klonopin for his anxiety but it is not helping. He wants to take the Xanax to help with sleep. Patient reported that he continues to hear auditory hallucinations and has been hearing God's voice. Patient feels unjustified about his psychiatric admission. He reported that there are people coming into his house and stealing his medications. Patient claims he has been compliant with his prescribed psychiatric medicine and denies that he's been drinking or using drugs. Does not report any particular stressor.   Social history: Patient lives by himself. He then went to a tangent about his family  history and reported that everybody is deceased and his family and his mother was killed by [employer]. He was very disorganized and has to be redirected several times during the interview.  Family history history: He was unable to tell me about his family history of psychiatric illness.  Medical history: Questionable history of diabetes   Substance abuse history: States he does not drink alcohol and does not use drugs but did use drugs for a while when he was a teenager.   Associated Signs/Symptoms: Depression Symptoms: depressed mood, psychomotor agitation, feelings of worthlessness/guilt, impaired memory, anxiety, panic attacks, disturbed sleep, (Hypo) Manic Symptoms: Elevated Mood, Flight of Ideas, Hallucinations, Impulsivity, Irritable Mood, Labiality of Mood, Anxiety Symptoms: Excessive Worry, Psychotic Symptoms: Delusions, Ideas of Reference, Paranoia, PTSD Symptoms: Negative  Past Psychiatric History:   Patient has long history of schizophrenia. He has been taking olanzapine and Klonopin as prescribed by Dr. Jimmye Braun. He follows him at the [facility] outpatient psychiatric clinic.  Principal Problem: Schizophrenia, paranoid [facility]) Discharge Diagnoses: Patient Active Problem List   Diagnosis Date Noted  . Schizophrenia, paranoid ([place]) [F20.0] 04/14/2015  . Noncompliance [Z91.19] 04/14/2015    Musculoskeletal: Strength & Muscle Tone: within normal limits Gait & Station: normal Patient leans: N/A  Psychiatric Specialty Exam: Physical Exam  Nursing note and vitals reviewed.   Review of Systems  Psychiatric/Behavioral: The patient is nervous/anxious.   All other systems reviewed and are negative.   Blood pressure 124/84, pulse 89, temperature 98.2 F (36.8 C), temperature source  Oral, resp. rate 20, height 5\' 6"  (1.676 m), weight 87.998 kg (194 lb), SpO2 99 %.Body mass index is 31.33 kg/(m^2).  See SRA.                                                   Sleep:  Number of Hours: 7.1   Have you used any form of tobacco in the last 30 days? (Cigarettes, Smokeless Tobacco, Cigars, and/or Pipes): No  Has this patient used any form of tobacco in the last 30 days? (Cigarettes, Smokeless Tobacco, Cigars, and/or Pipes) No  Past Medical History:  Past Medical History  Diagnosis Date  . Schizophrenia ([place])   . Generalized anxiety disorder   . Anxiety     Past Surgical History  Procedure Laterality Date  . Tonsillectomy     Family History:  Family History  Problem Relation Age of Onset  . Family history unknown: Yes   Social History:  History  Alcohol Use No     History  Drug Use No    Social History   Social History  . Marital Status: Single    Spouse Name: N/A  . Number of Children: N/A  . Years of Education: N/A   Social History Main Topics  . Smoking status: Former Smoker    Types: Cigarettes    Quit date: 06/11/1999  . Smokeless tobacco: Never Used  . Alcohol Use: No  . Drug Use: No  . Sexual Activity: No   Other Topics Concern  . None   Social History Narrative    Past Psychiatric History: Hospitalizations:  Outpatient Care:  Substance Abuse Care:  Self-Mutilation:  Suicidal Attempts:  Violent Behaviors:   Risk to Self: Is patient at risk for suicide?: No Risk to Others:   Prior Inpatient Therapy:   Prior Outpatient Therapy:    Level of Care:  OP  Hospital Course:    [name] is a 49 year old male with a history of schizophrenia meeting in a psychotic episode in the context of medication noncompliance  .1. Psychosis. He was restarted on Zyprexa and dose was increased to 10 mg twice daily for psychosis and mood stabilization. We changed it to Zyprexa Zydis  And lower his dose to 10 mg nightly. The patient adamantly refuses injectable antipsychotics explaining that this is forbidden by the Bible.   2. Anxiety. We continued Klonopin to 1 mg twice daily as in the community. He has hydroxyzine  available as well.   3. Insomnia. He slept 7 hours with trazodone.   4. Metabolic syndrome.Lipid panel is elevated with normal hemoglobin A1c and TSH. We recommend low cholesterol diet.   5. Disposition. He was discharged back to his apartment. He will follow up with Dr. Jimmye Braun. Dr. Jimmye Braun suggested community support team to improve compliance and the decrease hospitalization.   Consults:  None  Significant Diagnostic Studies:  None  Discharge Vitals:   Blood pressure 124/84, pulse 89, temperature 98.2 F (36.8 C), temperature source Oral, resp. rate 20, height 5\' 6"  (1.676 m), weight 87.998 kg (194 lb), SpO2 99 %. Body mass index is 31.33 kg/(m^2). Lab Results:   No results found for this or any previous visit (from the past 72 hour(s)).  Physical Findings: AIMS: Facial and Oral Movements Muscles of Facial Expression: None, normal Lips and Perioral Area: None, normal Jaw: None, normal  Tongue: None, normal,Extremity Movements Upper (arms, wrists, hands, fingers): None, normal Lower (legs, knees, ankles, toes): None, normal, Trunk Movements Neck, shoulders, hips: None, normal, Overall Severity Severity of abnormal movements (highest score from questions above): None, normal Incapacitation due to abnormal movements: None, normal Patient's awareness of abnormal movements (rate only patient's report): No Awareness, Dental Status Current problems with teeth and/or dentures?: No Does patient usually wear dentures?: No  CIWA:    COWS:      See Psychiatric Specialty Exam and Suicide Risk Assessment completed by Attending Physician prior to discharge.  Discharge destination:  Home  Is patient on multiple antipsychotic therapies at discharge:  No   Has Patient had three or more failed trials of antipsychotic monotherapy by history:  No    Recommended Plan for Multiple Antipsychotic Therapies: NA  Discharge Instructions    Diet - low sodium heart healthy    Complete by:  As  directed      Increase activity slowly    Complete by:  As directed             Medication List    STOP taking these medications        OLANZapine 5 MG tablet  Commonly known as:  ZYPREXA  Replaced by:  OLANZapine zydis 10 MG disintegrating tablet      TAKE these medications      Indication   clonazePAM 1 MG tablet  Commonly known as:  KLONOPIN  Take 1 tablet (1 mg total) by mouth 2 (two) times daily.      cyclobenzaprine 10 MG tablet  Commonly known as:  FLEXERIL  Take 10 mg by mouth every 8 (eight) hours as needed (for back/neck pain).      hydrOXYzine 50 MG tablet  Commonly known as:  ATARAX/VISTARIL  Take 1 tablet (50 mg total) by mouth at bedtime.   Indication:  Anxiety Neurosis     OLANZapine zydis 10 MG disintegrating tablet  Commonly known as:  ZYPREXA  Take 1 tablet (10 mg total) by mouth at bedtime.   Indication:  Schizophrenia     traZODone 100 MG tablet  Commonly known as:  DESYREL  Take 1 tablet (100 mg total) by mouth at bedtime.   Indication:  Trouble Sleeping           Follow-up Information    Follow up with [facility] - Dr. Jimmye Norman  DO NOT PRINT - NOT COMPLETE.   Why:  For follow-up care   Contact information:   [address], [place] [address] Ph [phone] Fax [phone]       Follow-up recommendations:  Activity:  As tolerated. Diet:  Low sodium heart healthy. Other:  Keep follow-up appointments.  Comments:    Total Discharge Time: 35 min.  Signed: Orson Slick 04/20/2015, 11:38 AM

## 2015-04-20 NOTE — Telephone Encounter (Signed)
I have been in touch with Corene Cornea,  the social worker on the inpatient unit today. I explained the social worker that ideally patient would do better with more enhanced services. He typically does not come to his appointments and then eventually needs refills and finally when he is told he will not getting more refills he will eventually show up for an appointment that this often can span anywhere from 4-5 months. He is reluctant to come in for his appointments due to some mild paranoid delusions about his neighborhood. Ideally a service that can go to him would be better. This was explained the Education officer, museum. AW

## 2015-05-01 ENCOUNTER — Ambulatory Visit (INDEPENDENT_AMBULATORY_CARE_PROVIDER_SITE_OTHER): Payer: Medicare HMO | Admitting: Psychiatry

## 2015-05-01 ENCOUNTER — Encounter: Payer: Self-pay | Admitting: Psychiatry

## 2015-05-01 VITALS — BP 122/86 | HR 95 | Temp 97.6°F | Ht 66.0 in | Wt 202.4 lb

## 2015-05-01 DIAGNOSIS — F209 Schizophrenia, unspecified: Secondary | ICD-10-CM | POA: Diagnosis not present

## 2015-05-01 MED ORDER — HYDROXYZINE HCL 50 MG PO TABS: 50.0000 mg | ORAL_TABLET | Freq: Every day | ORAL | Status: DC

## 2015-05-01 MED ORDER — TRAZODONE HCL 100 MG PO TABS: 100.0000 mg | ORAL_TABLET | Freq: Every day | ORAL | Status: DC

## 2015-05-01 MED ORDER — OLANZAPINE 10 MG PO TABS: 10.0000 mg | ORAL_TABLET | Freq: Two times a day (BID) | ORAL | Status: DC

## 2015-05-01 NOTE — Progress Notes (Signed)
BH MD/PA/NP OP Progress Note  05/01/2015 10:18 AM Benjamin Braun  MRN:  [id number]  Subjective:  Patient returns for follow-up of his schizophrenia. He was hospitalized earlier this month due to a king calls to Event organiser. He discussed at that time having auditory hallucinations of God's voice. He was admitted to the inpatient unit. Appears he was largely continued on his Zyprexa as well as Klonopin however is Cipro dose was increased to 10 mg twice a day. Patient states today is been compliant with his medications. He denies any issues aside from his chronic thoughts that people are breaking into his house. He indicates he has no desire to hurt himself or other people. Chief Complaint: Nothing Chief Complaint    Medication Refill; Follow-up    refills Visit Diagnosis:     ICD-9-CM ICD-10-CM   1. Schizophrenia, unspecified type ([place])  F20.9     Past Medical History:  Past Medical History  Diagnosis Date  . Schizophrenia ([place])   . Generalized anxiety disorder   . Anxiety     Past Surgical History  Procedure Laterality Date  . Tonsillectomy     Family History:  Family History  Problem Relation Age of Onset  . Family history unknown: Yes   Social History:  Social History   Social History  . Marital Status: Single    Spouse Name: N/A  . Number of Children: N/A  . Years of Education: N/A   Social History Main Topics  . Smoking status: Former Smoker    Types: Cigarettes    Quit date: 06/11/1999  . Smokeless tobacco: Never Used  . Alcohol Use: No  . Drug Use: No  . Sexual Activity: No   Other Topics Concern  . None   Social History Narrative   Additional History:   Assessment:   Musculoskeletal: Strength & Muscle Tone: within normal limits Gait & Station: normal Patient leans: N/A  Psychiatric Specialty Exam: HPI  Review of Systems  Psychiatric/Behavioral: Negative for depression, suicidal ideas, hallucinations (he hears the voice of God as he has in  the past. This is been stable and is not distressing to him.), memory loss and substance abuse. The patient is not nervous/anxious and does not have insomnia.   All other systems reviewed and are negative.   Blood pressure 122/86, pulse 95, temperature 97.6 F (36.4 C), temperature source Tympanic, height 5\' 6"  (1.676 m), weight 202 lb 6.4 oz (91.808 kg), SpO2 95 %.Body mass index is 32.68 kg/(m^2).  General Appearance: Fairly Groomed  Eye Contact:  Fair  Speech:  Normal Rate  Volume:  Normal  Mood:  Good  Affect:  Constricted  Thought Process:  Loose at baseline  Orientation:  Full (Time, Place, and Person)  Thought Content:  Negative and Paranoia about people stealing from him  Suicidal Thoughts:  No  Homicidal Thoughts:  No  Memory:  Immediate;   Fair Recent;   Fair Remote;   Fair  Judgement:  Fair  Insight:  Fair  Psychomotor Activity:  Negative  Concentration:  Fair  Recall:  [employer] of Knowledge: Fair  Language: Good  Akathisia:  Negative  Handed:  Right unknown  AIMS (if indicated):  Last done November 2015  Assets:  Desire for Improvement  ADL's:  Intact  Cognition: WNL  Sleep:  Good   Is the patient at risk to self?  No. Has the patient been a risk to self in the past 6 months?  No. Has the patient  been a risk to self within the distant past?  No. Is the patient a risk to others?  No. Has the patient been a risk to others in the past 6 months?  No. Has the patient been a risk to others within the distant past?  No.  Current Medications: Current Outpatient Prescriptions  Medication Sig Dispense Refill  . clonazePAM (KLONOPIN) 1 MG tablet Take 1 tablet (1 mg total) by mouth 2 (two) times daily. 60 tablet 3  . cyclobenzaprine (FLEXERIL) 10 MG tablet Take 10 mg by mouth every 8 (eight) hours as needed (for back/neck pain).    . hydrOXYzine (ATARAX/VISTARIL) 50 MG tablet Take 1 tablet (50 mg total) by mouth at bedtime. 30 tablet 2  . traZODone (DESYREL) 100 MG  tablet Take 1 tablet (100 mg total) by mouth at bedtime. 30 tablet 2  . OLANZapine (ZYPREXA) 10 MG tablet Take 1 tablet (10 mg total) by mouth 2 (two) times daily. 60 tablet 2   No current facility-administered medications for this visit.    Medical Decision Making:  Established Problem, Stable/Improving (1)  Treatment Plan Summary:Medication management   Schizophrenia will continue patient's medications changes that were made on the inpatient unit. Thus a continue his Zyprexa 10 mg twice daily and clonazepam 1 mg twice daily. He was written for Zyprexa Zydis on the inpatient unit however I'm going to switch him over to the regular tablets given that patient is now able to make his decisions about taking his medicine or not taking it and thus situs on the outpatient basis may be of limited use. He was also continues trazodone 100 mg at bedtime and his hydroxyzine 50 mg at bedtime.  She will follow up in 2 months. I've encouraged call any questions or concerns prior to his next appointment.     Faith Rogue 05/01/2015, 10:18 AM

## 2015-05-08 ENCOUNTER — Telehealth: Payer: Self-pay

## 2015-05-08 NOTE — Telephone Encounter (Signed)
[employer] approved olanzapine 10mg  tablets, until 05-04-16

## 2015-06-07 NOTE — Progress Notes (Signed)
Pharmacy notified.

## 2015-07-03 ENCOUNTER — Ambulatory Visit: Payer: Medicare HMO | Admitting: Psychiatry

## 2015-07-10 ENCOUNTER — Ambulatory Visit: Payer: Medicare HMO | Admitting: Psychiatry

## 2015-07-21 ENCOUNTER — Ambulatory Visit: Payer: Self-pay | Admitting: Psychiatry

## 2015-07-21 ENCOUNTER — Ambulatory Visit: Payer: Medicare HMO | Admitting: Psychiatry

## 2015-08-01 ENCOUNTER — Telehealth: Payer: Self-pay

## 2015-08-01 NOTE — Telephone Encounter (Signed)
pt wants a refill has appt for Monday.  Pt will not have enough klonopin to last until appt.

## 2015-08-07 ENCOUNTER — Ambulatory Visit: Payer: Medicare HMO | Admitting: Psychiatry

## 2015-09-04 ENCOUNTER — Encounter: Payer: Self-pay | Admitting: Psychiatry

## 2015-09-04 ENCOUNTER — Ambulatory Visit (INDEPENDENT_AMBULATORY_CARE_PROVIDER_SITE_OTHER): Payer: PPO | Admitting: Psychiatry

## 2015-09-04 VITALS — BP 122/76 | HR 110 | Temp 97.1°F | Ht 66.0 in | Wt 207.2 lb

## 2015-09-04 DIAGNOSIS — F209 Schizophrenia, unspecified: Secondary | ICD-10-CM | POA: Diagnosis not present

## 2015-09-04 MED ORDER — HYDROXYZINE HCL 50 MG PO TABS: 50.0000 mg | ORAL_TABLET | Freq: Every day | ORAL | Status: DC

## 2015-09-04 MED ORDER — TRAZODONE HCL 100 MG PO TABS: 100.0000 mg | ORAL_TABLET | Freq: Every day | ORAL | Status: DC

## 2015-09-04 MED ORDER — OLANZAPINE 10 MG PO TABS: 10.0000 mg | ORAL_TABLET | Freq: Two times a day (BID) | ORAL | Status: DC

## 2015-09-04 NOTE — Progress Notes (Signed)
Patient ID: Benjamin Braun, male   DOB: [birth year], 50 y.o.   MRN: [id number] [facility] MD/PA/NP OP Progress Note  09/04/2015 10:23 AM FISHER MEHR  MRN:  [id number]  Subjective:  Patient returns for follow-up of his schizophrenia. Patient was previously seen by Dr. Jimmye Norman. This is the first visit for this patient with this clinician. Patient reports that he ran out of his Klonopin a month ago and he should've had a prescription for from Dr. Jimmye Norman but it was not filled. She insists that he needs to be on the clonazepam on Xanax but does not elaborate on what symptoms he is having. Patient not willing to engage with this clinician on finding medications to help with his anxiety, he does not discuss any of his anxiety symptoms. Patient keeps insisting that he needs to be on Xanax. He reports hearing voices but does not elaborate. Denies suicidal thoughts or homicidal thoughts.  Chief Complaint: Nothing refills Visit Diagnosis:   No diagnosis found.  Past Medical History:  Past Medical History  Diagnosis Date  . Schizophrenia ([place])   . Generalized anxiety disorder   . Anxiety     Past Surgical History  Procedure Laterality Date  . Tonsillectomy     Family History:  Family History  Problem Relation Age of Onset  . Family history unknown: Yes   Social History:  Social History   Social History  . Marital Status: Single    Spouse Name: N/A  . Number of Children: N/A  . Years of Education: N/A   Social History Main Topics  . Smoking status: Former Smoker    Types: Cigarettes    Quit date: 06/11/1999  . Smokeless tobacco: Never Used  . Alcohol Use: No  . Drug Use: No  . Sexual Activity: No   Other Topics Concern  . Not on file   Social History Narrative   Additional History:   Assessment:   Musculoskeletal: Strength & Muscle Tone: within normal limits Gait & Station: normal Patient leans: N/A  Psychiatric Specialty Exam: HPI  Review of Systems   Psychiatric/Behavioral: Negative for depression, suicidal ideas, hallucinations (he hears the voice of God as he has in the past. This is been stable and is not distressing to him.), memory loss and substance abuse. The patient is not nervous/anxious and does not have insomnia.   All other systems reviewed and are negative.   There were no vitals taken for this visit.There is no weight on file to calculate BMI.  General Appearance: Fairly Groomed  Eye Contact:  Fair  Speech:  Normal Rate  Volume:  Normal  Mood:  Good  Affect:  Constricted  Thought Process:  Loose at baseline  Orientation:  Full (Time, Place, and Person)  Thought Content:  Negative and Paranoia about people stealing from him  Suicidal Thoughts:  No  Homicidal Thoughts:  No  Memory:  Immediate;   Fair Recent;   Fair Remote;   Fair  Judgement:  Fair  Insight:  Fair  Psychomotor Activity:  Negative  Concentration:  Fair  Recall:  [employer] of Knowledge: Fair  Language: Good  Akathisia:  Negative  Handed:  Right unknown  AIMS (if indicated):  Last done November 2015  Assets:  Desire for Improvement  ADL's:  Intact  Cognition: WNL  Sleep:  Good   Is the patient at risk to self?  No. Has the patient been a risk to self in the past 6 months?  No. Has the  patient been a risk to self within the distant past?  No. Is the patient a risk to others?  No. Has the patient been a risk to others in the past 6 months?  No. Has the patient been a risk to others within the distant past?  No.  Current Medications: Current Outpatient Prescriptions  Medication Sig Dispense Refill  . clonazePAM (KLONOPIN) 1 MG tablet Take 1 tablet (1 mg total) by mouth 2 (two) times daily. 60 tablet 3  . cyclobenzaprine (FLEXERIL) 10 MG tablet Take 10 mg by mouth every 8 (eight) hours as needed (for back/neck pain).    . hydrOXYzine (ATARAX/VISTARIL) 50 MG tablet Take 1 tablet (50 mg total) by mouth at bedtime. 30 tablet 2  . OLANZapine  (ZYPREXA) 10 MG tablet Take 1 tablet (10 mg total) by mouth 2 (two) times daily. 60 tablet 2  . traZODone (DESYREL) 100 MG tablet Take 1 tablet (100 mg total) by mouth at bedtime. 30 tablet 2   No current facility-administered medications for this visit.    Medical Decision Making:  Established Problem, Stable/Improving (1)  Treatment Plan Summary:Medication management   Schizophrenia  continue his Zyprexa 10 mg twice daily. Discontinue the clonazepam since patient reports he has not been taking it for a month. Continue trazodone at 100 mg at bedtime Patient  upset at not being prescribed Klonopin and states he'll find a different doctor Patient not willing to engage with this clinician on finding medications to help with his anxiety, he does not discuss any of his anxiety symptoms. Patient keeps insisting that he needs to be on Xanax. Patient has previously missed several appointments and was very fussy today about coming to this appointment. Since patient would like to find a different doctor we will be terminating his care here and he will be called about transitioning care to a different psychiatrist.  Elvin So 09/04/2015, 10:23 AM

## 2016-11-03 ENCOUNTER — Emergency Department
Admission: EM | Admit: 2016-11-03 | Discharge: 2016-11-04 | Disposition: A | Discharge status: Other Acute Inpatient Hospital | Payer: Medicare HMO | Attending: Emergency Medicine | Admitting: Emergency Medicine

## 2016-11-03 ENCOUNTER — Encounter: Payer: Self-pay | Admitting: Emergency Medicine

## 2016-11-03 DIAGNOSIS — Z87891 Personal history of nicotine dependence: Secondary | ICD-10-CM | POA: Insufficient documentation

## 2016-11-03 DIAGNOSIS — Z046 Encounter for general psychiatric examination, requested by authority: Secondary | ICD-10-CM | POA: Diagnosis present

## 2016-11-03 DIAGNOSIS — F200 Paranoid schizophrenia: Secondary | ICD-10-CM | POA: Diagnosis not present

## 2016-11-03 DIAGNOSIS — Z91199 Patient's noncompliance with other medical treatment and regimen due to unspecified reason: Secondary | ICD-10-CM

## 2016-11-03 DIAGNOSIS — Z9119 Patient's noncompliance with other medical treatment and regimen: Secondary | ICD-10-CM

## 2016-11-03 DIAGNOSIS — Z79899 Other long term (current) drug therapy: Secondary | ICD-10-CM | POA: Diagnosis not present

## 2016-11-03 DIAGNOSIS — F209 Schizophrenia, unspecified: Secondary | ICD-10-CM | POA: Diagnosis not present

## 2016-11-03 LAB — HEPATIC FUNCTION PANEL
ALBUMIN: 4.4 g/dL (ref 3.5–5.0)
ALT: 28 U/L (ref 17–63)
AST: 29 U/L (ref 15–41)
Alkaline Phosphatase: 86 U/L (ref 38–126)
TOTAL PROTEIN: 7.3 g/dL (ref 6.5–8.1)
Total Bilirubin: 0.6 mg/dL (ref 0.3–1.2)

## 2016-11-03 LAB — URINALYSIS, COMPLETE (UACMP) WITH MICROSCOPIC
BILIRUBIN URINE: NEGATIVE
Bacteria, UA: NONE SEEN
Glucose, UA: NEGATIVE mg/dL
HGB URINE DIPSTICK: NEGATIVE
Ketones, ur: NEGATIVE mg/dL
Leukocytes, UA: NEGATIVE
NITRITE: NEGATIVE
PROTEIN: NEGATIVE mg/dL
Specific Gravity, Urine: 1.009 (ref 1.005–1.030)
Squamous Epithelial / LPF: NONE SEEN
pH: 7 (ref 5.0–8.0)

## 2016-11-03 LAB — CBC WITH DIFFERENTIAL/PLATELET
Basophils Absolute: 0.1 10*3/uL (ref 0–0.1)
Basophils Relative: 1 %
EOS ABS: 0.1 10*3/uL (ref 0–0.7)
Eosinophils Relative: 1 %
HCT: 45.5 % (ref 40.0–52.0)
HEMOGLOBIN: 15.7 g/dL (ref 13.0–18.0)
LYMPHS ABS: 3 10*3/uL (ref 1.0–3.6)
LYMPHS PCT: 29 %
MCH: 29.1 pg (ref 26.0–34.0)
MCHC: 34.6 g/dL (ref 32.0–36.0)
MCV: 84.1 fL (ref 80.0–100.0)
Monocytes Absolute: 0.8 10*3/uL (ref 0.2–1.0)
Monocytes Relative: 8 %
NEUTROS ABS: 6.5 10*3/uL (ref 1.4–6.5)
NEUTROS PCT: 61 %
Platelets: 237 10*3/uL (ref 150–440)
RBC: 5.41 MIL/uL (ref 4.40–5.90)
RDW: 13.1 % (ref 11.5–14.5)
WBC: 10.5 10*3/uL (ref 3.8–10.6)

## 2016-11-03 LAB — ETHANOL

## 2016-11-03 LAB — BASIC METABOLIC PANEL
ANION GAP: 9 (ref 5–15)
BUN: 27 mg/dL — ABNORMAL HIGH (ref 6–20)
CHLORIDE: 105 mmol/L (ref 101–111)
CO2: 26 mmol/L (ref 22–32)
Calcium: 9.3 mg/dL (ref 8.9–10.3)
Creatinine, Ser: 1.3 mg/dL — ABNORMAL HIGH (ref 0.61–1.24)
GFR calc non Af Amer: >60 mL/min (ref >60)
Glucose, Bld: 119 mg/dL — ABNORMAL HIGH (ref 65–99)
POTASSIUM: 3.5 mmol/L (ref 3.5–5.1)
SODIUM: 140 mmol/L (ref 135–145)

## 2016-11-03 LAB — URINE DRUG SCREEN, QUALITATIVE (ARMC ONLY)
Amphetamines, Ur Screen: NOT DETECTED
BARBITURATES, UR SCREEN: NOT DETECTED
Benzodiazepine, Ur Scrn: NOT DETECTED
CANNABINOID 50 NG, UR ~~LOC~~: NOT DETECTED
Cocaine Metabolite,Ur ~~LOC~~: NOT DETECTED
MDMA (Ecstasy)Ur Screen: NOT DETECTED
Methadone Scn, Ur: NOT DETECTED
Opiate, Ur Screen: NOT DETECTED
Phencyclidine (PCP) Ur S: NOT DETECTED
TRICYCLIC, UR SCREEN: NOT DETECTED

## 2016-11-03 LAB — SALICYLATE LEVEL: Salicylate Lvl: <7 mg/dL (ref 2.8–30.0)

## 2016-11-03 LAB — ACETAMINOPHEN LEVEL

## 2016-11-03 MED ORDER — SODIUM CHLORIDE 0.9 % IV BOLUS (SEPSIS)
1000.0000 mL | Freq: Once | INTRAVENOUS | Status: AC
  Administered 2016-11-03: 1000 mL via INTRAVENOUS

## 2016-11-03 NOTE — ED Notes (Signed)
BEHAVIORAL HEALTH ROUNDING  Patient sleeping: No.  Patient alert and oriented: yes  Behavior appropriate: Yes. ; If no, describe:  Nutrition and fluids offered: Yes  Toileting and hygiene offered: Yes  Sitter present: not applicable, Q 15 min safety rounds and observation.  Law enforcement present: Yes ODS  

## 2016-11-03 NOTE — ED Notes (Signed)
ENVIRONMENTAL ASSESSMENT  Potentially harmful objects out of patient reach: Yes.  Personal belongings secured: Yes.  Patient dressed in hospital provided attire only: Yes.  Plastic bags out of patient reach: Yes.  Patient care equipment (cords, cables, call bells, lines, and drains) shortened, removed, or accounted for: Yes.  Equipment and supplies removed from bottom of stretcher: Yes.  Potentially toxic materials out of patient reach: Yes.  Sharps container removed or out of patient reach: Yes.    BEHAVIORAL HEALTH ROUNDING  Patient sleeping: No.  Patient alert and oriented: yes  Behavior appropriate: Yes. ; If no, describe:  Nutrition and fluids offered: Yes  Toileting and hygiene offered: Yes  Sitter present: not applicable, Q 15 min safety rounds and observation.  Law enforcement present: Yes ODS  ED [place]  Is the patient under IVC or is there intent for IVC: Yes.  Is the patient medically cleared: Yes.  Is there vacancy in the ED BHU: Yes.  Is the population mix appropriate for patient: Yes.  Is the patient awaiting placement in inpatient or outpatient setting: Yes.  Has the patient had a psychiatric consult: Yes.  Survey of unit performed for contraband, proper placement and condition of furniture, tampering with fixtures in bathroom, shower, and each patient room: Yes. ; Findings: All clear  APPEARANCE/BEHAVIOR  calm, cooperative and adequate rapport can be established  NEURO ASSESSMENT  Orientation: time, place and person  Hallucinations: pt reports he hears voices and it is singing.   Speech: Normal  Gait: normal  RESPIRATORY ASSESSMENT  WNL  CARDIOVASCULAR ASSESSMENT  WNL  GASTROINTESTINAL ASSESSMENT  WNL  EXTREMITIES  WNL  PLAN OF CARE  Provide calm/safe environment. Vital signs assessed TID. ED BHU Assessment once each 12-hour shift. Collaborate with TTS daily or as condition indicates. Assure the ED provider has rounded once each shift.  Provide and encourage hygiene. Provide redirection as needed. Assess for escalating behavior; address immediately and inform ED provider.  Assess family dynamic and appropriateness for visitation as needed: Yes. ; If necessary, describe findings:  Educate the patient/family about BHU procedures/visitation: Yes. ; If necessary, describe findings: Pt is calm and cooperative at this time. Pt understanding and accepting of unit procedures/rules. Will continue to monitor with Q 15 min safety rounds and observation.   Pt is hard of hearing in his left ear.
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
## 2016-11-03 NOTE — ED Notes (Signed)
Pt was given a

## 2016-11-03 NOTE — ED Notes (Signed)
Urine sample was sent on this patient to the lab. Rn was notified
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
## 2016-11-03 NOTE — ED Notes (Signed)
This tech went through this pt's belongings looking for the pt's house key. Pt does not have a key within his belongings. This information was communicated back to the RN, Lelon Frohlich
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
## 2016-11-03 NOTE — ED Notes (Signed)
Pt was given a sandwich tray for a bedtime snack with a sprite. No utensils were given. Pt was given a blanket and a pillow. Pt calm and cooperative at this time, pt is talking aloud to himself but is not aggressive with staff. Environment is safe. Pt currently lying in bed awake

## 2016-11-04 ENCOUNTER — Inpatient Hospital Stay
Admission: EM | Admit: 2016-11-04 | Discharge: 2016-11-16 | DRG: 885 | Disposition: A | Discharge status: Home / Self Care | Payer: PPO | Source: Intra-hospital | Attending: Psychiatry | Admitting: Psychiatry

## 2016-11-04 DIAGNOSIS — G4700 Insomnia, unspecified: Secondary | ICD-10-CM | POA: Diagnosis present

## 2016-11-04 DIAGNOSIS — Z87891 Personal history of nicotine dependence: Secondary | ICD-10-CM | POA: Diagnosis not present

## 2016-11-04 DIAGNOSIS — Z9119 Patient's noncompliance with other medical treatment and regimen: Secondary | ICD-10-CM

## 2016-11-04 DIAGNOSIS — Z9114 Patient's other noncompliance with medication regimen: Secondary | ICD-10-CM | POA: Diagnosis not present

## 2016-11-04 DIAGNOSIS — F203 Undifferentiated schizophrenia: Principal | ICD-10-CM | POA: Diagnosis present

## 2016-11-04 DIAGNOSIS — M549 Dorsalgia, unspecified: Secondary | ICD-10-CM | POA: Diagnosis present

## 2016-11-04 DIAGNOSIS — F209 Schizophrenia, unspecified: Secondary | ICD-10-CM | POA: Diagnosis not present

## 2016-11-04 DIAGNOSIS — F200 Paranoid schizophrenia: Secondary | ICD-10-CM | POA: Diagnosis not present

## 2016-11-04 DIAGNOSIS — F411 Generalized anxiety disorder: Secondary | ICD-10-CM | POA: Diagnosis not present

## 2016-11-04 DIAGNOSIS — Z01810 Encounter for preprocedural cardiovascular examination: Secondary | ICD-10-CM | POA: Diagnosis not present

## 2016-11-04 IMAGING — CR DG CHEST 2V
1 series · 2 of 2 positions shown · non-contrast
Comparison: None.

CLINICAL DATA: 49-year-old brought to the emergency department by
the [REDACTED] [place] [facility] with tachycardia.

EXAM:
CHEST  2 VIEW

[Series 1: w chest pa · 0.14mm/px · 2 of 2 slices shown]
[im 1/2]
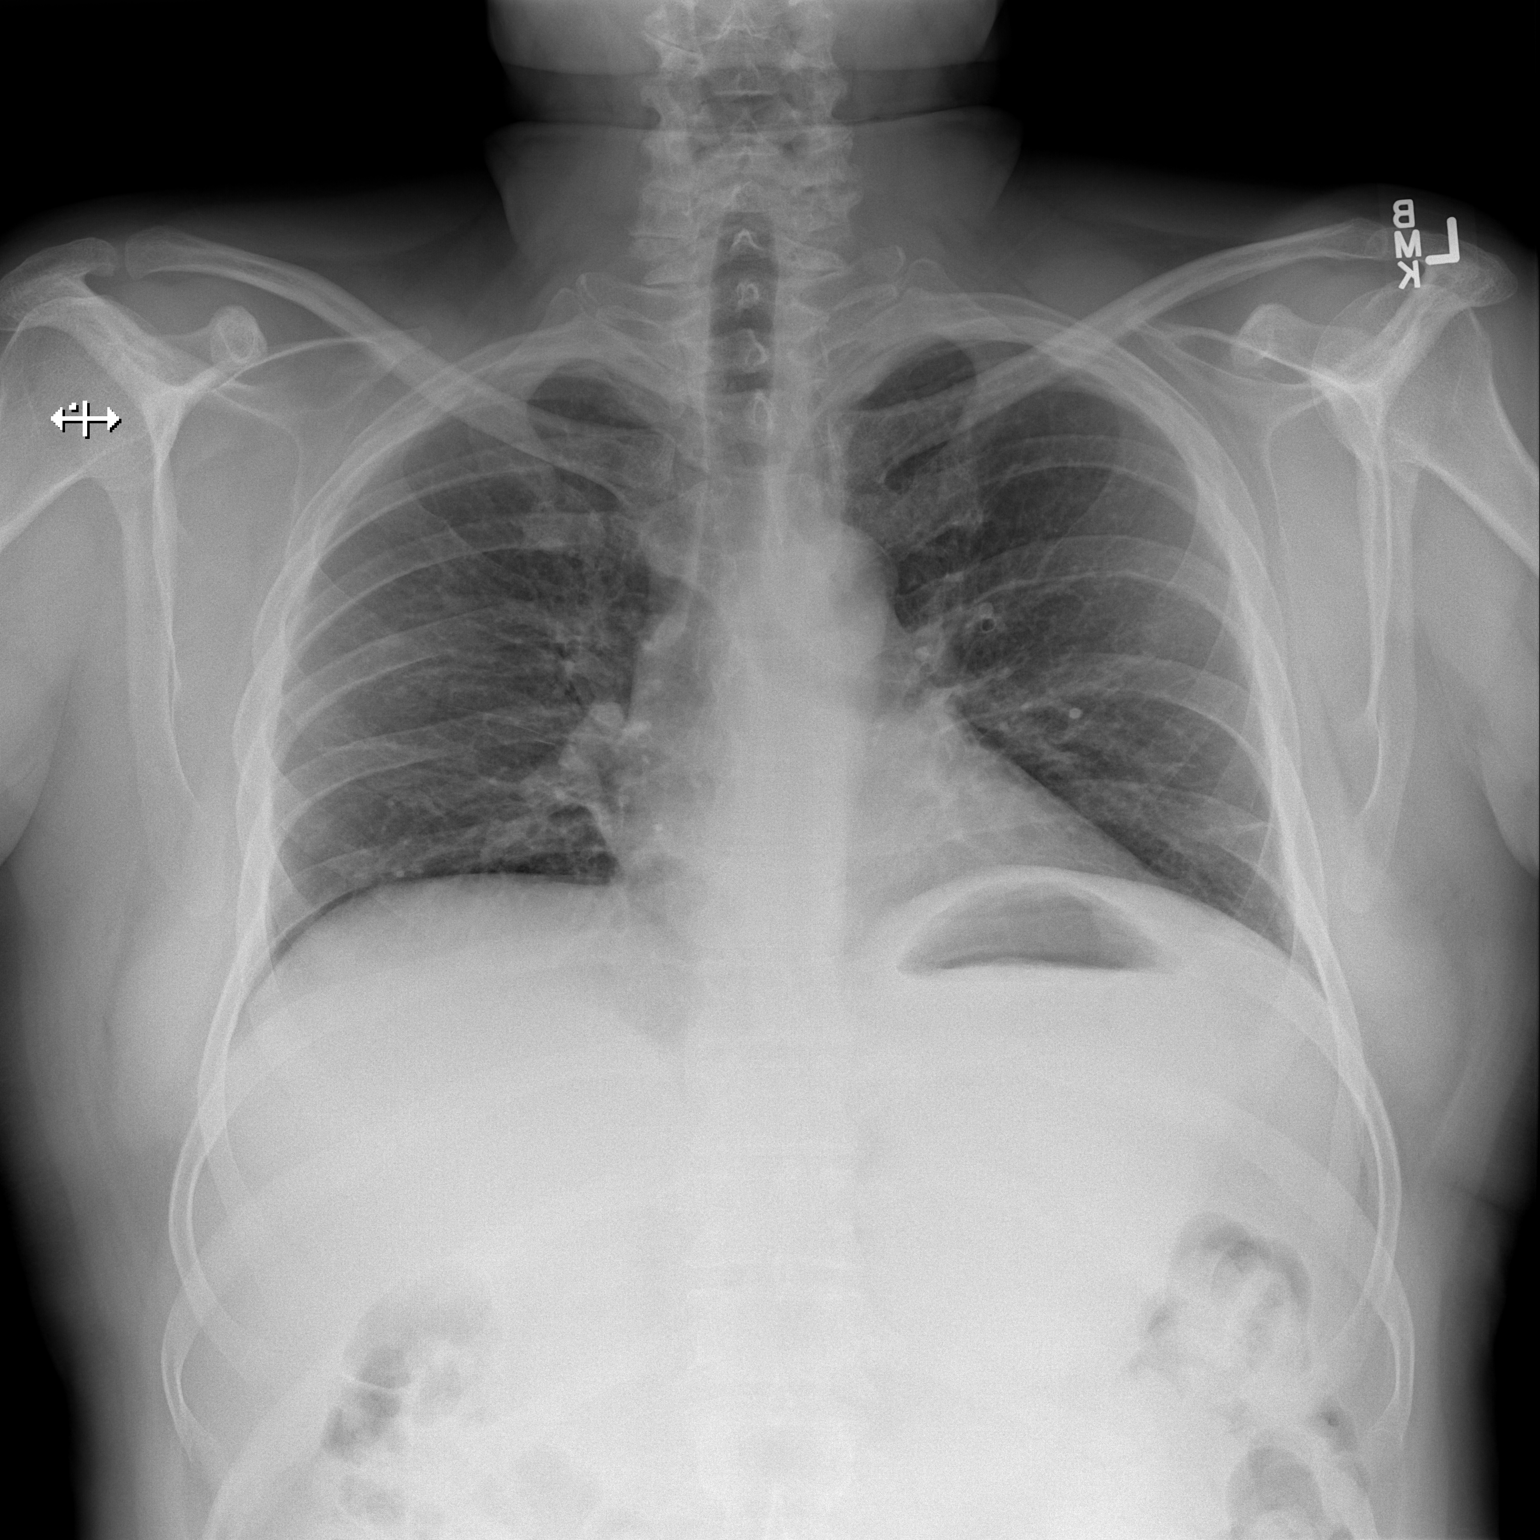
[im 2/2]
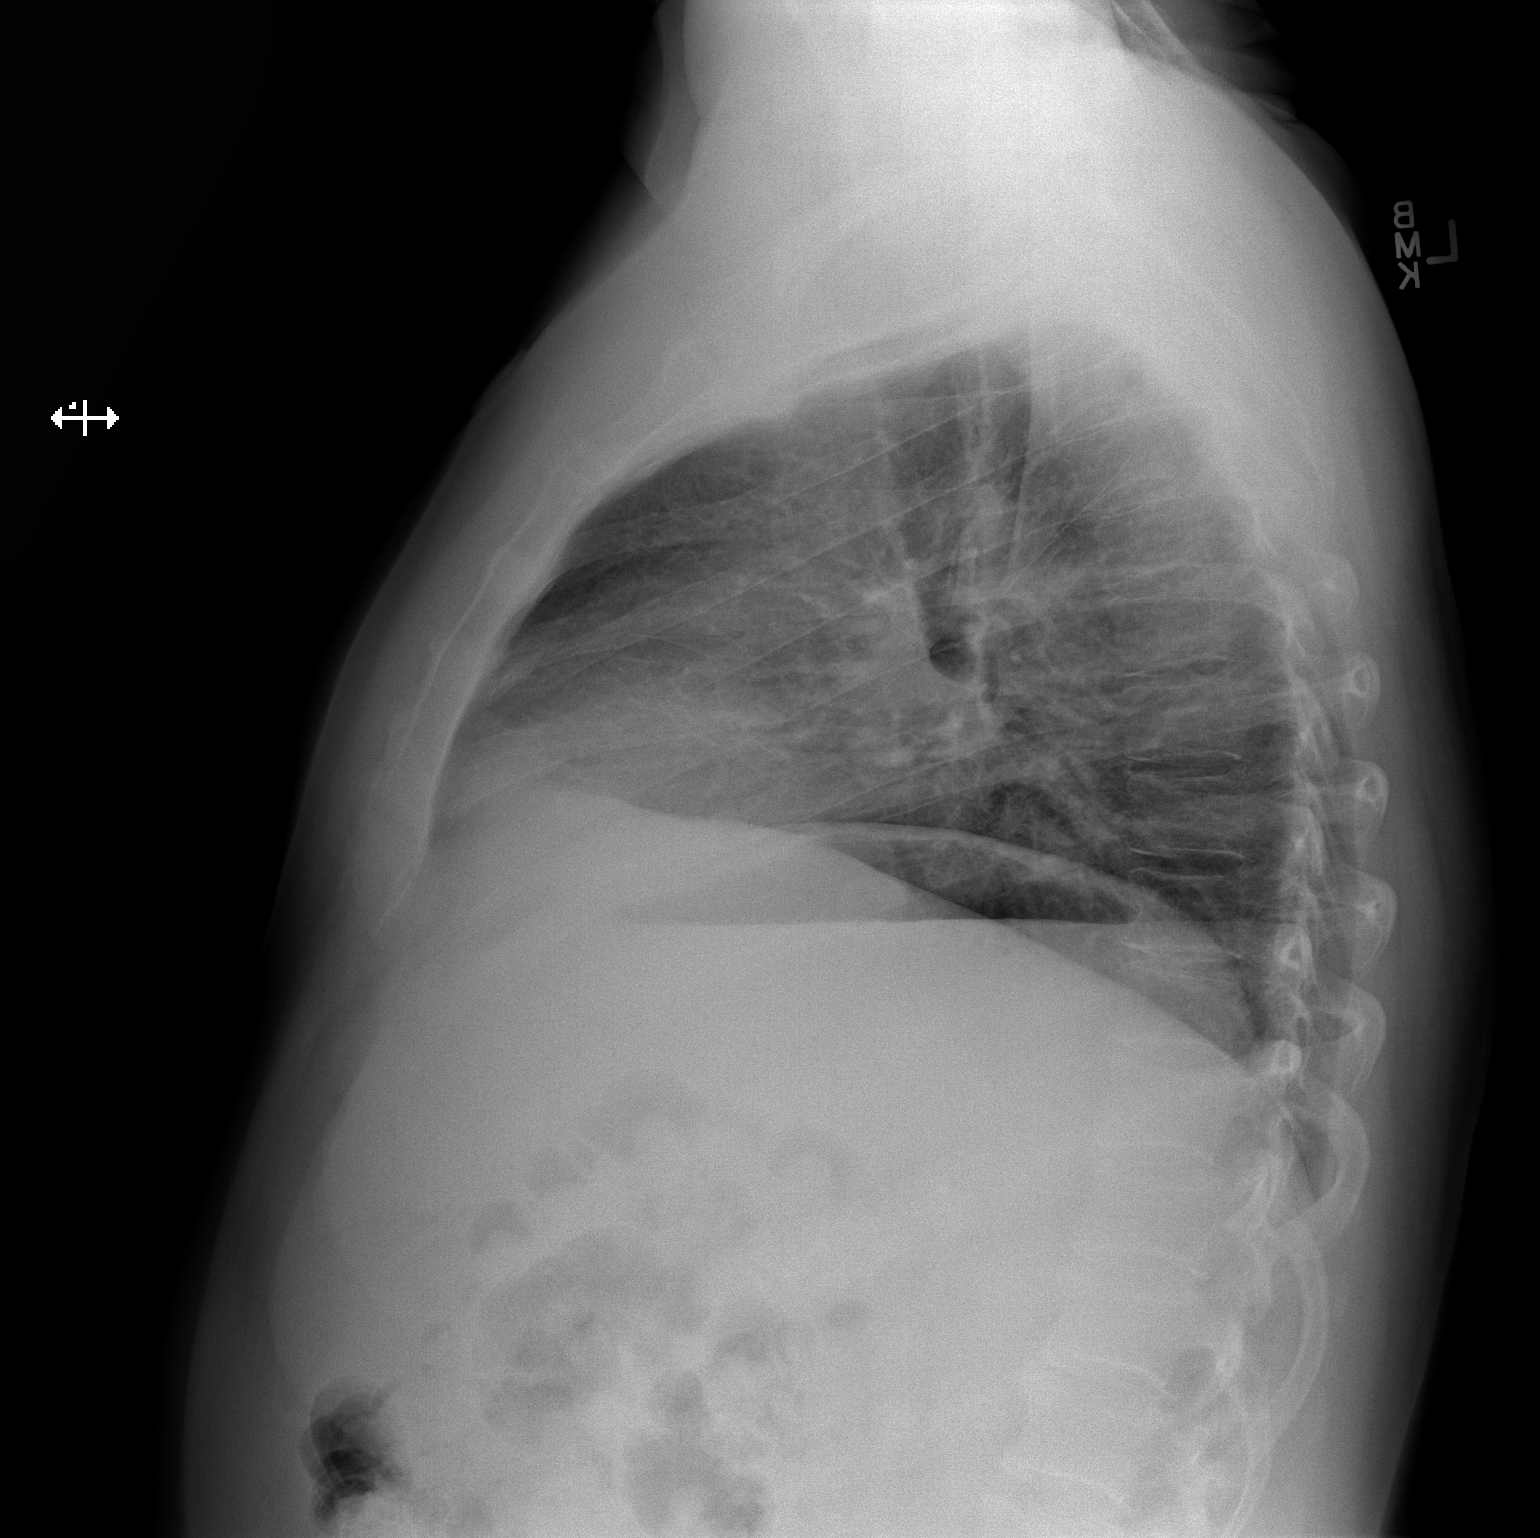

[2 of 2 positions shown; findings below may reference images not displayed]

FINDINGS: Markedly suboptimal inspiration accounts for atelectasis in the
lower lobes, right middle lobe and lingula. Lungs otherwise clear.
No localized airspace consolidation. No pleural effusions. No
pneumothorax. Normal pulmonary vascularity. Cardiomediastinal
silhouette unremarkable. Visualized bony thorax intact.
IMPRESSION: Suboptimal inspiration accounts for atelectasis in the lower lobes,
right middle lobe and lingula. No acute cardiopulmonary disease
otherwise.

## 2016-11-04 MED ORDER — LORAZEPAM 0.5 MG PO TABS: 0.5000 mg | ORAL_TABLET | Freq: Four times a day (QID) | ORAL | Status: DC | PRN

## 2016-11-04 MED ORDER — OLANZAPINE 10 MG PO TABS
10.0000 mg | ORAL_TABLET | Freq: Two times a day (BID) | ORAL | Status: DC
  Administered 2016-11-05 – 2016-11-07 (×4): 10 mg via ORAL
  Filled 2016-11-04 (×4): qty 1

## 2016-11-04 MED ORDER — TRAZODONE HCL 100 MG PO TABS
100.0000 mg | ORAL_TABLET | Freq: Every day | ORAL | Status: DC
Start: 2016-11-04 — End: 2016-11-04
  Administered 2016-11-04: 100 mg via ORAL
  Filled 2016-11-04: qty 1

## 2016-11-04 MED ORDER — IBUPROFEN 600 MG PO TABS
600.0000 mg | ORAL_TABLET | Freq: Four times a day (QID) | ORAL | Status: DC | PRN
  Administered 2016-11-04: 600 mg via ORAL
  Filled 2016-11-04: qty 1

## 2016-11-04 MED ORDER — TRAZODONE HCL 100 MG PO TABS
100.0000 mg | ORAL_TABLET | Freq: Every day | ORAL | Status: DC
  Administered 2016-11-05 – 2016-11-15 (×11): 100 mg via ORAL
  Filled 2016-11-04 (×11): qty 1

## 2016-11-04 MED ORDER — MAGNESIUM HYDROXIDE 400 MG/5ML PO SUSP: 30.0000 mL | Freq: Every day | ORAL | Status: DC | PRN

## 2016-11-04 MED ORDER — ALUM & MAG HYDROXIDE-SIMETH 200-200-20 MG/5ML PO SUSP: 30.0000 mL | ORAL | Status: DC | PRN

## 2016-11-04 MED ORDER — ASPIRIN EC 325 MG PO TBEC
325.0000 mg | DELAYED_RELEASE_TABLET | Freq: Once | ORAL | Status: DC
  Filled 2016-11-04: qty 1

## 2016-11-04 MED ORDER — ACETAMINOPHEN 325 MG PO TABS: 650.0000 mg | ORAL_TABLET | Freq: Four times a day (QID) | ORAL | Status: DC | PRN

## 2016-11-04 MED ORDER — HALOPERIDOL 0.5 MG PO TABS: 1.0000 mg | ORAL_TABLET | Freq: Four times a day (QID) | ORAL | Status: DC | PRN

## 2016-11-04 MED ORDER — CLONAZEPAM 1 MG PO TABS
1.0000 mg | ORAL_TABLET | Freq: Two times a day (BID) | ORAL | Status: DC
  Administered 2016-11-04 (×2): 1 mg via ORAL
  Filled 2016-11-04 (×2): qty 1

## 2016-11-04 MED ORDER — IBUPROFEN 600 MG PO TABS
600.0000 mg | ORAL_TABLET | Freq: Four times a day (QID) | ORAL | Status: DC | PRN
  Administered 2016-11-06: 600 mg via ORAL
  Filled 2016-11-04: qty 1

## 2016-11-04 MED ORDER — CLONAZEPAM 0.5 MG PO TABS
1.0000 mg | ORAL_TABLET | Freq: Two times a day (BID) | ORAL | Status: DC
  Administered 2016-11-05: 1 mg via ORAL
  Filled 2016-11-04: qty 2

## 2016-11-04 MED ORDER — OLANZAPINE 10 MG PO TABS
10.0000 mg | ORAL_TABLET | Freq: Two times a day (BID) | ORAL | Status: DC
  Administered 2016-11-04: 10 mg via ORAL
  Filled 2016-11-04: qty 1

## 2016-11-04 MED ORDER — HALOPERIDOL 0.5 MG PO TABS
1.0000 mg | ORAL_TABLET | Freq: Four times a day (QID) | ORAL | Status: DC | PRN
  Administered 2016-11-09: 1 mg via ORAL
  Filled 2016-11-04 (×3): qty 2
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
## 2016-11-04 NOTE — ED Notes (Signed)
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
## 2016-11-04 NOTE — ED Provider Notes (Signed)
-----------------------------------------   7:38 AM on 11/04/2016 -----------------------------------------   Blood pressure 124/74, pulse 71, temperature 98 F (36.7 C), temperature source Oral, resp. rate 18, height 5\' 2"  (1.575 m), weight 77.1 kg (170 lb), SpO2 100 %.  The patient had no acute events since last update.  Patient remains under involuntary commitment. Currently awaiting inpatient psychiatric placement.   Harvest Dark, MD 11/04/16 [phone]

## 2016-11-04 NOTE — ED Notes (Signed)
BEHAVIORAL HEALTH ROUNDING  Patient sleeping: No.  Patient alert and oriented: yes  Behavior appropriate: Yes. ; If no, describe:  Nutrition and fluids offered: Yes  Toileting and hygiene offered: Yes  Sitter present: not applicable, Q 15 min safety rounds and observation.  Law enforcement present: Yes ODS  

## 2016-11-04 NOTE — ED Notes (Signed)
Patient is sleeping at this time, no signs of distress, Patient being monitored q 15 minute and by camera surveillance for protective.
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
## 2016-11-04 NOTE — BH Assessment (Signed)
Patient is to be admitted to [facility] by [name].  Attending Physician will be [name].   Patient has been assigned to room 315, by [name].   Intake Paper Work has been signed and placed on patient chart.  ER staff is aware of the admission (  [name], ER MD; [name] Patient's Nurse & [name] Patient Access).

## 2016-11-04 NOTE — ED Notes (Signed)
Report called to Margaret, RN in the ED BHU.  

## 2016-11-04 NOTE — ED Notes (Signed)
Patient ate 100% of lunch and beverage. Patient is calm and cooperative.

## 2016-11-04 NOTE — ED Notes (Signed)
Pt requesting this RN to call his brother to check his dog at his apartment. Brother called and will come and get pt's key to check on the dog.

## 2016-11-04 NOTE — ED Notes (Signed)
Sandwich and soft drink given.  

## 2016-11-04 NOTE — ED Notes (Signed)
Report given to SOC MD. Camera placed in room and process explained to pt.  
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
## 2016-11-04 NOTE — ED Notes (Signed)
Patient is delusional, talking about the mafia, and the crypts, stating that they are against him and that He is the lucky Charm and all of the hurricanes are lesson to be learned because God is getting even with people for messing with him, He even states that the nurses here are against him. Patient is hard of hearing also, patient complains of headache, and nurse received order for motrin. Patient denies Si/hi appears to be  Hearing voices, q 15 minute checks and camera surveillance in progress for protection.

## 2016-11-04 NOTE — ED Notes (Signed)
Preparing pt for transfer to BMU.  

## 2016-11-04 NOTE — ED Notes (Signed)
Patient ate 100% of breakfast and beverage.  

## 2016-11-04 NOTE — ED Notes (Signed)
[facility] consult completed. Camera removed from room.

## 2016-11-05 ENCOUNTER — Encounter: Payer: Self-pay | Admitting: Psychiatry

## 2016-11-05 DIAGNOSIS — F203 Undifferentiated schizophrenia: Principal | ICD-10-CM

## 2016-11-05 LAB — LIPID PANEL
CHOLESTEROL: 166 mg/dL (ref 0–200)
HDL: 32 mg/dL — AB (ref >40)
LDL CALC: 115 mg/dL — AB (ref 0–99)
Total CHOL/HDL Ratio: 5.2 RATIO
Triglycerides: 95 mg/dL (ref <150)
VLDL: 19 mg/dL (ref 0–40)

## 2016-11-05 LAB — TSH: TSH: 0.697 u[IU]/mL (ref 0.350–4.500)

## 2016-11-05 NOTE — Progress Notes (Signed)
Patient isolative to self and room. Took morning meds but would not get up for evening meds. Pt isolative to self and room. No peer interaction. Stayed in bed all of shift, reports he is extremely tired. Denies SI, HI, AVH. Pt a poor historian.  Encouragement and support offered. Safety checks maintained.  Pt remains safe on unit with q 15 min checks.
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
## 2016-11-05 NOTE — Progress Notes (Signed)
Patient ID: Benjamin Braun, male   DOB: February 01, 1966, 51 y.o.   MRN: [id number] Admitted to the unit from [name] for mood instability, paranoia, odd and bizarre behaviors, anger/aggression, medication adjustment/compliance, hyper religiosity. HOH wears hearing aid in the right ear, cooperative with Skin Assessment and Contraband Search performed by me and Ms Amy, RN; gross skin intact except for only one tattoo site on the right shoulder; no contraband found on patient and in his belongings. UDS negative, patient denied tobacco and alcohol use.

## 2016-11-05 NOTE — [facility] Group Notes (Signed)
[facility] LCSW Group Therapy Note  Date/Time: 11/05/16, 1500  Type of Therapy/Topic:  Group Therapy:  Feelings about Diagnosis  Participation Level:  Did Not Attend   Mood:   Description of Group:    This group will allow patients to explore their thoughts and feelings about diagnoses they have received. Patients will be guided to explore their level of understanding and acceptance of these diagnoses. Facilitator will encourage patients to process their thoughts and feelings about the reactions of others to their diagnosis, and will guide patients in identifying ways to discuss their diagnosis with significant others in their lives. This group will be process-oriented, with patients participating in exploration of their own experiences as well as giving and receiving support and challenge from other group members.   Therapeutic Goals: 1. Patient will demonstrate understanding of diagnosis as evidence by identifying two or more symptoms of the disorder:  2. Patient will be able to express two feelings regarding the diagnosis 3. Patient will demonstrate ability to communicate their needs through discussion and/or role plays  Summary of Patient Progress:        Therapeutic Modalities:   Cognitive Behavioral Therapy Brief Therapy Feelings Identification   Greg Shivon Hackel, LCSW 

## 2016-11-05 NOTE — Plan of Care (Signed)
Problem: Safety: Goal: Ability to remain free from injury will improve Outcome: Progressing No harm reported or observed

## 2016-11-05 NOTE — Plan of Care (Signed)
Problem: Activity: Goal: Sleeping patterns will improve Outcome: Progressing Patient slept for Estimated Hours of 6; every 15 minutes safety round maintained, no injury or falls during this shift.    

## 2016-11-05 NOTE — Progress Notes (Signed)
Recreation Therapy Notes  At approximately 10:35 am, LRT attempted assessment. Patient was sleeping and did not wake up when name was called.  Leonette Monarch, LRT/CTRS 11/05/2016 1:06 PM

## 2016-11-05 NOTE — Progress Notes (Signed)
Recreation Therapy Notes  Date: 05.29.18 Time: 9:30 am Location: Craft Room  Group Topic: Self-expression  Goal Area(s) Addresses:  Patient will identify one color per emotion listed on wheel. Patient will verbalize benefit of using art as a means of self-expression. Patient will verbalize one emotion experienced during session. Patient will be educated on other forms of self-expression.  Behavioral Response: Did not attend  Intervention: Emotion Wheel  Activity: Patients were given an Emotion Wheel worksheet and were instructed to pick a color for each emotion listed on the wheel.  Education: LRT educated patients on other forms of self-expression.   Education Outcome: Patient did not attend group.   Clinical Observations/Feedback: Patient did not attend group.  Leonette Monarch, LRT/CTRS 11/05/2016 10:05 AM

## 2016-11-05 NOTE — [facility] Suicide Risk Assessment (Signed)
[facility] Admission Suicide Risk Assessment   Nursing information obtained from:    Demographic factors:    Current Mental Status:    Loss Factors:    Historical Factors:    Risk Reduction Factors:     Total Time spent with patient: 1 hour Principal Problem: Undifferentiated schizophrenia ([place]) Diagnosis:   Patient Active Problem List   Diagnosis Date Noted  . Undifferentiated schizophrenia ([place]) [F20.3] 11/04/2016  . Noncompliance [Z91.19] 04/14/2015   Subjective Data: psychotic break.  Continued Clinical Symptoms:  Alcohol Use Disorder Identification Test Final Score (AUDIT): 0 The "Alcohol Use Disorders Identification Test", Guidelines for Use in Primary Care, Second Edition.  World Pharmacologist [facility]). Score between 0-7:  no or low risk or alcohol related problems. Score between 8-15:  moderate risk of alcohol related problems. Score between 16-19:  high risk of alcohol related problems. Score 20 or above:  warrants further diagnostic evaluation for alcohol dependence and treatment.   CLINICAL FACTORS:   Schizophrenia:   Paranoid or undifferentiated type Currently Psychotic Unstable or Poor Therapeutic Relationship   Musculoskeletal: Strength & Muscle Tone: within normal limits Gait & Station: normal Patient leans: N/A  Psychiatric Specialty Exam: Physical Exam  Nursing note and vitals reviewed. Psychiatric: His speech is normal. His mood appears anxious. He is actively hallucinating. Thought content is paranoid and delusional. Cognition and memory are normal. He expresses impulsivity.    Review of Systems  Psychiatric/Behavioral: Positive for hallucinations.  All other systems reviewed and are negative.   Blood pressure 121/88, pulse 79, temperature 98.7 F (37.1 C), temperature source Oral, resp. rate 18, height 5\' 6"  (1.676 m), weight 81.6 kg (180 lb), SpO2 100 %.Body mass index is 29.05 kg/m.  General Appearance: Casual  Eye Contact:  Good  Speech:  Clear  and Coherent  Volume:  Normal  Mood:  Anxious  Affect:  Blunt  Thought Process:  Goal Directed and Descriptions of Associations: Intact  Orientation:  Full (Time, Place, and Person)  Thought Content:  Delusions, Hallucinations: Auditory and Paranoid Ideation  Suicidal Thoughts:  No  Homicidal Thoughts:  No  Memory:  Immediate;   Fair Recent;   Fair Remote;   Fair  Judgement:  Poor  Insight:  Lacking  Psychomotor Activity:  Normal  Concentration:  Concentration: Fair and Attention Span: Fair  Recall:  [employer] of Knowledge:  Fair  Language:  Fair  Akathisia:  No  Handed:  Right  AIMS (if indicated):     Assets:  Communication Skills Desire for Improvement Financial Resources/Insurance Housing Physical Health Resilience Social Support  ADL's:  Intact  Cognition:  WNL  Sleep:  Number of Hours: 6      COGNITIVE FEATURES THAT CONTRIBUTE TO RISK:  None    SUICIDE RISK:   Mild:  Suicidal ideation of limited frequency, intensity, duration, and specificity.  There are no identifiable plans, no associated intent, mild dysphoria and related symptoms, good self-control (both objective and subjective assessment), few other risk factors, and identifiable protective factors, including available and accessible social support.  PLAN OF CARE: Hospital admission, medication management, discharge planning.  Mr. Trentman is a 51 year old male with a history of schizophrenia admitted in a psychotic episode in the context of medication noncompliance  1. Psychosis. He was restarted on Zyprexa zydis 10 mg twice daily for psychosis and mood stabilization. The patient adamantly refuses injectable antipsychotics explaining that this is forbidden by the Bible.   2. Anxiety. He has been off clonazepam for a  while. I will not restart it. Hydroxyzine  Is available.   3. Insomnia. Trazodone is available.   4. Metabolic syndrome.Lipid panel, TSH and HgbA1C are pending.   5. EKG.  Pending.  6. Disposition. He was discharged back to his apartment. He will follow up with [facility]. He would benefit from ACT team but no funds available.     I certify that inpatient services furnished can reasonably be expected to improve the patient's condition.   Orson Slick, MD 11/05/2016, 10:41 AM
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
## 2016-11-05 NOTE — Tx Team (Addendum)
Initial Treatment Plan 11/05/2016 1:11 AM Benjamin Braun YYP:[id number]    PATIENT STRESSORS: Financial difficulties Health problems Medication change or noncompliance   PATIENT STRENGTHS: Average or above average intelligence Capable of independent living   PATIENT IDENTIFIED PROBLEMS: Mood Instability  Homicidal Ideation  Odd Bizarre Behavior  Anger/Aggression                DISCHARGE CRITERIA:  Improved stabilization in mood, thinking, and/or behavior Motivation to continue treatment in a less acute level of care Verbal commitment to aftercare and medication compliance  PRELIMINARY DISCHARGE PLAN: Outpatient therapy Return to previous living arrangement  PATIENT/FAMILY INVOLVEMENT: This treatment plan has been presented to and reviewed with the patient, Benjamin Braun.  The patient and family have been given the opportunity to ask questions and make suggestions.  Electa Sniff, RN 11/05/2016, 1:11 AM

## 2016-11-06 LAB — HEMOGLOBIN A1C
Hgb A1c MFr Bld: 5.5 % (ref 4.8–5.6)
MEAN PLASMA GLUCOSE: 111 mg/dL

## 2016-11-06 MED ORDER — CYCLOBENZAPRINE HCL 10 MG PO TABS
5.0000 mg | ORAL_TABLET | Freq: Three times a day (TID) | ORAL | Status: DC
Start: 2016-11-06 — End: 2016-11-16
  Administered 2016-11-06 – 2016-11-16 (×30): 5 mg via ORAL
  Filled 2016-11-06 (×13): qty 1
  Filled 2016-11-06: qty 2
  Filled 2016-11-06 (×3): qty 1
  Filled 2016-11-06: qty 2
  Filled 2016-11-06 (×12): qty 1

## 2016-11-06 NOTE — Tx Team (Signed)
Interdisciplinary Treatment and Diagnostic Plan Update  11/06/2016 Time of Session: 10:30 AM Benjamin Braun MRN: [id number]  Principal Diagnosis: Undifferentiated schizophrenia [facility]  Secondary Diagnoses: Principal Problem:   Undifferentiated schizophrenia ([place])   Current Medications:  Current Facility-Administered Medications  Medication Dose Route Frequency Provider Last Rate Last Dose  . acetaminophen (TYLENOL) tablet 650 mg  650 mg Oral Q6H PRN Clapacs, [name], MD      . alum & mag hydroxide-simeth (MAALOX/MYLANTA) 200-200-20 MG/5ML suspension 30 mL  30 mL Oral Q4H PRN Clapacs, [name], MD      . haloperidol (HALDOL) tablet 1 mg  1 mg Oral Q6H PRN Clapacs, [name], MD      . ibuprofen (ADVIL,MOTRIN) tablet 600 mg  600 mg Oral Q6H PRN Clapacs, [name], MD   600 mg at 11/06/16 0959  . magnesium hydroxide (MILK OF MAGNESIA) suspension 30 mL  30 mL Oral Daily PRN Clapacs, [name], MD      . OLANZapine (ZYPREXA) tablet 10 mg  10 mg Oral BID AC Clapacs, [name], MD   10 mg at 11/06/16 0746  . traZODone (DESYREL) tablet 100 mg  100 mg Oral QHS Clapacs, [name], MD   100 mg at 11/05/16 2202   PTA Medications: Prescriptions Prior to Admission  Medication Sig Dispense Refill Last Dose  . clonazePAM (KLONOPIN) 1 MG tablet Take 1 tablet (1 mg total) by mouth 2 (two) times daily. 60 tablet 3 Taking  . cyclobenzaprine (FLEXERIL) 10 MG tablet Take 10 mg by mouth every 8 (eight) hours as needed (for back/neck pain).   Taking  . hydrOXYzine (ATARAX/VISTARIL) 50 MG tablet Take 1 tablet (50 mg total) by mouth at bedtime. 30 tablet 2   . OLANZapine (ZYPREXA) 10 MG tablet Take 1 tablet (10 mg total) by mouth 2 (two) times daily. 60 tablet 2   . traZODone (DESYREL) 100 MG tablet Take 1 tablet (100 mg total) by mouth at bedtime. 30 tablet 2     Patient Stressors: Financial difficulties Health problems Medication change or noncompliance  Patient Strengths: Average or above average intelligence Capable of  independent living  Treatment Modalities: Medication Management, Group therapy, Case management,  1 to 1 session with clinician, Psychoeducation, Recreational therapy.   Physician Treatment Plan for Primary Diagnosis: Undifferentiated schizophrenia ([place]) Long Term Goal(s): Improvement in symptoms so as ready for discharge NA   Short Term Goals: Ability to identify changes in lifestyle to reduce recurrence of condition will improve Ability to verbalize feelings will improve Ability to disclose and discuss suicidal ideas Ability to demonstrate self-control will improve Ability to identify and develop effective coping behaviors will improve Compliance with prescribed medications will improve Ability to identify triggers associated with substance abuse/mental health issues will improve NA  Medication Management: Evaluate patient's response, side effects, and tolerance of medication regimen.  Therapeutic Interventions: 1 to 1 sessions, Unit Group sessions and Medication administration.  Evaluation of Outcomes: Progressing  Physician Treatment Plan for Secondary Diagnosis: Principal Problem:   Undifferentiated schizophrenia ([place])  Long Term Goal(s): Improvement in symptoms so as ready for discharge NA   Short Term Goals: Ability to identify changes in lifestyle to reduce recurrence of condition will improve Ability to verbalize feelings will improve Ability to disclose and discuss suicidal ideas Ability to demonstrate self-control will improve Ability to identify and develop effective coping behaviors will improve Compliance with prescribed medications will improve Ability to identify triggers associated with substance abuse/mental health issues will improve NA  Medication Management: Evaluate patient's response, side effects, and tolerance of medication regimen.  Therapeutic Interventions: 1 to 1 sessions, Unit Group sessions and Medication administration.  Evaluation of  Outcomes: Progressing   RN Treatment Plan for Primary Diagnosis: Undifferentiated schizophrenia ([place]) Long Term Goal(s): Knowledge of disease and therapeutic regimen to maintain health will improve  Short Term Goals: Ability to demonstrate self-control, Ability to verbalize feelings will improve and Compliance with prescribed medications will improve  Medication Management: RN will administer medications as ordered by provider, will assess and evaluate patient's response and provide education to patient for prescribed medication. RN will report any adverse and/or side effects to prescribing provider.  Therapeutic Interventions: 1 on 1 counseling sessions, Psychoeducation, Medication administration, Evaluate responses to treatment, Monitor vital signs and CBGs as ordered, Perform/monitor CIWA, COWS, AIMS and Fall Risk screenings as ordered, Perform wound care treatments as ordered.  Evaluation of Outcomes: Progressing   LCSW Treatment Plan for Primary Diagnosis: Undifferentiated schizophrenia ([place]) Long Term Goal(s): Safe transition to appropriate next level of care at discharge, Engage patient in therapeutic group addressing interpersonal concerns.  Short Term Goals: Engage patient in aftercare planning with referrals and resources, Increase social support and Increase skills for wellness and recovery  Therapeutic Interventions: Assess for all discharge needs, 1 to 1 time with Social worker, Explore available resources and support systems, Assess for adequacy in community support network, Educate family and significant other(s) on suicide prevention, Complete Psychosocial Assessment, Interpersonal group therapy.  Evaluation of Outcomes: Progressing   Progress in Treatment: Attending groups: Yes. Participating in groups: Yes. Taking medication as prescribed: Yes. Toleration medication: Yes. Family/Significant other contact made: No, will contact:  CSW assessing proper contacts. Patient  understands diagnosis: Yes. Discussing patient identified problems/goals with staff: Yes. Medical problems stabilized or resolved: Yes. Denies suicidal/homicidal ideation: Yes. Issues/concerns per patient self-inventory: No.  New problem(s) identified: No, Describe:  None identified.  New Short Term/Long Term Goal(s):  Discharge Plan or Barriers:   Reason for Continuation of Hospitalization: Delusions  Hallucinations Medication stabilization  Estimated Length of Stay: 7 days   Attendees: Patient: Benjamin Braun  11/06/2016 11:03 AM  Physician: Dr. Orson Slick, MD  11/06/2016 11:03 AM  Nursing: Polly Cobia, RN  11/06/2016 11:03 AM  RN Care Manager: 11/06/2016 11:03 AM  Social Worker: Glorious Peach, MSW, [place] 11/06/2016 11:03 AM  Recreational Therapist:  11/06/2016 11:03 AM  Other: Lorrine Kin, MSW, LCSW 11/06/2016 11:03 AM  Other:  11/06/2016 11:03 AM  Other: 11/06/2016 11:03 AM    Scribe for Treatment Team: Emilie Rutter, [place] 11/06/2016 11:03 AM

## 2016-11-06 NOTE — BHH Group Notes (Signed)
[place] Group Notes:  (Nursing/MHT/Case Management/Adjunct)  Date:  11/06/2016  Time:  12:25 AM  Type of Therapy:  Group Therapy  Participation Level:  Did Not Attend  Participation Quality: Summary of Progress/Problems:  Benjamin Braun 11/06/2016, 12:25 AM

## 2016-11-06 NOTE — Progress Notes (Signed)
Patient ID: Benjamin Braun, male   DOB: 1966/01/04, 51 y.o.   MRN: [id number] [facility], hearing aid battery is in the patient specific bin in the med-lock; isolative to room except for during medications administration and snack time; no temper tantrums, denied SI/HI, denied AV/H.

## 2016-11-06 NOTE — Progress Notes (Signed)
[facility] MD Progress Note  11/06/2016 10:16 AM Benjamin Braun  MRN:  [id number]  Subjective:   11/06/2016. Benjamin Braun met with treatment team this morning. He is up and about today. He is extremely paranoid and delusional to the point of disorganization. He is uncertain if he still has appartment to go to. He took medications but already started asking about Klonopin. There are no somatic complaints. He is short of hearing but wears his hearing aid.  Per nursing: [facility], hearing aid battery is in the patient specific bin in the med-lock; isolative to room except for during medications administration and snack time; no temper tantrums, denied SI/HI, denied AV/H.  Principal Problem: Undifferentiated schizophrenia ([place]) Diagnosis:   Patient Active Problem List   Diagnosis Date Noted  . Undifferentiated schizophrenia ([place]) [F20.3] 11/04/2016  . Noncompliance [Z91.19] 04/14/2015   Total Time spent with patient: 30 minutes  Past Psychiatric History: schizophrenia.  Past Medical History:  Past Medical History:  Diagnosis Date  . Anxiety   . Generalized anxiety disorder   . Schizophrenia [facility])     Past Surgical History:  Procedure Laterality Date  . TONSILLECTOMY     Family History:  Family History  Problem Relation Age of Onset  . Family history unknown: Yes   Family Psychiatric  History: unknown. Social History:  History  Alcohol Use No     History  Drug Use No    Social History   Social History  . Marital status: Single    Spouse name: N/A  . Number of children: N/A  . Years of education: N/A   Social History Main Topics  . Smoking status: Former Smoker    Types: Cigarettes    Quit date: 06/11/1999  . Smokeless tobacco: Never Used  . Alcohol use No  . Drug use: No  . Sexual activity: No   Other Topics Concern  . None   Social History Narrative  . None   Additional Social History:                         Sleep: Fair  Appetite:  Fair  Current  Medications: Current Facility-Administered Medications  Medication Dose Route Frequency Provider Last Rate Last Dose  . acetaminophen (TYLENOL) tablet 650 mg  650 mg Oral Q6H PRN Clapacs, [name], MD      . alum & mag hydroxide-simeth (MAALOX/MYLANTA) 200-200-20 MG/5ML suspension 30 mL  30 mL Oral Q4H PRN Clapacs, [name], MD      . haloperidol (HALDOL) tablet 1 mg  1 mg Oral Q6H PRN Clapacs, [name], MD      . ibuprofen (ADVIL,MOTRIN) tablet 600 mg  600 mg Oral Q6H PRN Clapacs, [name], MD   600 mg at 11/06/16 0959  . magnesium hydroxide (MILK OF MAGNESIA) suspension 30 mL  30 mL Oral Daily PRN Clapacs, [name], MD      . OLANZapine (ZYPREXA) tablet 10 mg  10 mg Oral BID AC Clapacs, [name], MD   10 mg at 11/06/16 0746  . traZODone (DESYREL) tablet 100 mg  100 mg Oral QHS Clapacs, [name], MD   100 mg at 11/05/16 2202    Lab Results:  Results for orders placed or performed during the hospital encounter of 11/04/16 (from the past 48 hour(s))  Hemoglobin A1c     Status: None   Collection Time: 11/05/16 10:22 AM  Result Value Ref Range   Hgb A1c MFr Bld 5.5  4.8 - 5.6 %    Comment: (NOTE)         Pre-diabetes: 5.7 - 6.4         Diabetes: >6.4         Glycemic control for adults with diabetes: <7.0    Mean Plasma Glucose 111 mg/dL    Comment: (NOTE) Performed At: [facility] [place], [place] [id number] Lindon Romp MD [id number]   Lipid panel     Status: Abnormal   Collection Time: 11/05/16 10:22 AM  Result Value Ref Range   Cholesterol 166 0 - 200 mg/dL   Triglycerides 95 <150 mg/dL   HDL 32 (L) >40 mg/dL   Total CHOL/HDL Ratio 5.2 RATIO   VLDL 19 0 - 40 mg/dL   LDL Cholesterol 115 (H) 0 - 99 mg/dL    Comment:        Total Cholesterol/HDL:CHD Risk Coronary Heart Disease Risk Table                     Men   Women  1/2 Average Risk   3.4   3.3  Average Risk       5.0   4.4  2 X Average Risk   9.6   7.1  3 X Average Risk  23.4   11.0        Use the  calculated Patient Ratio above and the CHD Risk Table to determine the patient's CHD Risk.        ATP III CLASSIFICATION (LDL):  <100     mg/dL   Optimal  100-129  mg/dL   Near or Above                    Optimal  130-159  mg/dL   Borderline  160-189  mg/dL   High  >190     mg/dL   Very High   TSH     Status: None   Collection Time: 11/05/16 10:22 AM  Result Value Ref Range   TSH 0.697 0.350 - 4.500 uIU/mL    Comment: Performed by a 3rd Generation assay with a functional sensitivity of <=0.01 uIU/mL.    Blood Alcohol level:  Lab Results  Component Value Date   ETH <5 11/03/2016   ETH <5 90/24/0973    Metabolic Disorder Labs: Lab Results  Component Value Date   HGBA1C 5.5 11/05/2016   MPG 111 11/05/2016   No results found for: PROLACTIN Lab Results  Component Value Date   CHOL 166 11/05/2016   TRIG 95 11/05/2016   HDL 32 (L) 11/05/2016   CHOLHDL 5.2 11/05/2016   VLDL 19 11/05/2016   LDLCALC 115 (H) 11/05/2016   LDLCALC 132 (H) 04/15/2015    Physical Findings: AIMS:  , ,  ,  ,    CIWA:    COWS:     Musculoskeletal: Strength & Muscle Tone: within normal limits Gait & Station: normal Patient leans: N/A  Psychiatric Specialty Exam: Physical Exam  Nursing note and vitals reviewed. Psychiatric: His affect is blunt. His speech is delayed. He is withdrawn and actively hallucinating. Thought content is paranoid and delusional. Cognition and memory are normal. He expresses impulsivity.    Review of Systems  Psychiatric/Behavioral: Positive for hallucinations.  All other systems reviewed and are negative.   Blood pressure 120/89, pulse 88, temperature 98.5 F (36.9 C), resp. rate 18, height 5' 6"  (1.676 m), weight 81.6 kg (180 lb), SpO2 100 %.Body  mass index is 29.05 kg/m.  General Appearance: Casual  Eye Contact:  None  Speech:  Clear and Coherent  Volume:  Decreased  Mood:  Depressed  Affect:  Blunt  Thought Process:  Goal Directed and Descriptions of  Associations: Intact  Orientation:  Full (Time, Place, and Person)  Thought Content:  Delusions, Hallucinations: Auditory and Paranoid Ideation  Suicidal Thoughts:  No  Homicidal Thoughts:  No  Memory:  Immediate;   Fair Recent;   Fair Remote;   Fair  Judgement:  Poor  Insight:  Lacking  Psychomotor Activity:  Psychomotor Retardation  Concentration:  Concentration: Fair and Attention Span: Fair  Recall:  [employer] of Knowledge:  Fair  Language:  Fair  Akathisia:  No  Handed:  Right  AIMS (if indicated):     Assets:  Communication Skills Desire for Improvement Financial Resources/Insurance Housing Physical Health Resilience  ADL's:  Intact  Cognition:  WNL  Sleep:  Number of Hours: 7.15     Treatment Plan Summary: Daily contact with patient to assess and evaluate symptoms and progress in treatment and Medication management   Mr. Heskett is a 51 year old male with a history of schizophrenia admitted in a psychotic episode in the context of medication noncompliance  1. Psychosis. He was restarted on Zyprexa zydis 10 mg twice daily for psychosis and mood stabilization. The patient adamantly refuses injectable antipsychotics explaining that this is forbidden by the Bible.   2. Anxiety. He has been off clonazepam for a while. I will not restart it. Hydroxyzine  Is available.   3. Insomnia. Trazodone is available.   4. Metabolic syndrome.Lipid panel, TSH and HgbA1C are normal.    5. EKG. normal sinus rhythm, QTC 428.  6. Disposition. He was discharged back to his apartment. He will follow up with [facility]. He would benefit from ACT team but no funds available.   Orson Slick, MD 11/06/2016, 10:16 AM

## 2016-11-06 NOTE — Social Work (Signed)
[facility] refused to see patient due to age and previous appointment cancellations. CSW will continue to look for in-network providers.  Glorious Peach, MSW, LCSW-A 11/06/2016, 3:15PM

## 2016-11-06 NOTE — Plan of Care (Signed)
Problem: Health Behavior/Discharge Planning: Goal: Compliance with therapeutic regimen will improve Outcome: Progressing Patient med compliant

## 2016-11-06 NOTE — BHH Group Notes (Signed)
[place] LCSW Group Therapy  11/06/2016 1:49 PM  Type of Therapy:  Group Therapy  Participation Level:  None  Participation Quality:  Inattentive  Affect:  Blunted and Irritable  Cognitive:  Disorganized  Insight:  None  Engagement in Therapy:  Poor  Modes of Intervention:  Activity, Discussion, Education, Problem-solving, Art therapist, Socialization and Support  Summary of Progress/Problems: Emotional Regulation: Patients will identify both negative and positive emotions. They will discuss emotions they have difficulty regulating and how they impact their lives. Patients will be asked to identify healthy coping skills to combat unhealthy reactions to negative emotions. Patient did not participate in the group discussion even when prompted by CSW.   Benjamin Braun G. [place], [place] 11/06/2016, 1:49 PM

## 2016-11-06 NOTE — Plan of Care (Signed)
Problem: Activity: Goal: Sleeping patterns will improve Outcome: Progressing Patient slept for Estimated Hours of 7.15; every 15 minutes safety round maintained, no injury or falls during this shift.

## 2016-11-06 NOTE — Progress Notes (Deleted)
[facility] MD Progress Note  11/06/2016 4:22 AM Benjamin Braun  MRN:  [id number]  Subjective:   11/06/2016. Benjamin Braun met with treatment team this morning. He is up and about today. He is extremely paranoid and delusional to the point of disorganization. He is uncertain if he still has appartment to go to. He took medications but already started asking about Klonopin. There are no somatic complaints. He is short of hearing but wears his hearing aid.  Per nursing: [facility], hearing aid battery is in the patient specific bin in the med-lock; isolative to room except for during medications administration and snack time; no temper tantrums, denied SI/HI, denied AV/H.  Principal Problem: Undifferentiated schizophrenia ([place]) Diagnosis:   Patient Active Problem List   Diagnosis Date Noted  . Undifferentiated schizophrenia ([place]) [F20.3] 11/04/2016  . Noncompliance [Z91.19] 04/14/2015   Total Time spent with patient: 30 minutes  Past Psychiatric History: schizophrenia.  Past Medical History:  Past Medical History:  Diagnosis Date  . Anxiety   . Generalized anxiety disorder   . Schizophrenia [facility])     Past Surgical History:  Procedure Laterality Date  . TONSILLECTOMY     Family History:  Family History  Problem Relation Age of Onset  . Family history unknown: Yes   Family Psychiatric  History: unknown. Social History:  History  Alcohol Use No     History  Drug Use No    Social History   Social History  . Marital status: Single    Spouse name: N/A  . Number of children: N/A  . Years of education: N/A   Social History Main Topics  . Smoking status: Former Smoker    Types: Cigarettes    Quit date: 06/11/1999  . Smokeless tobacco: Never Used  . Alcohol use No  . Drug use: No  . Sexual activity: No   Other Topics Concern  . None   Social History Narrative  . None   Additional Social History:                         Sleep: Fair  Appetite:  Fair  Current  Medications: Current Facility-Administered Medications  Medication Dose Route Frequency Provider Last Rate Last Dose  . acetaminophen (TYLENOL) tablet 650 mg  650 mg Oral Q6H PRN Clapacs, [name], MD      . alum & mag hydroxide-simeth (MAALOX/MYLANTA) 200-200-20 MG/5ML suspension 30 mL  30 mL Oral Q4H PRN Clapacs, [name], MD      . haloperidol (HALDOL) tablet 1 mg  1 mg Oral Q6H PRN Clapacs, [name], MD      . ibuprofen (ADVIL,MOTRIN) tablet 600 mg  600 mg Oral Q6H PRN Clapacs, [name], MD      . magnesium hydroxide (MILK OF MAGNESIA) suspension 30 mL  30 mL Oral Daily PRN Clapacs, [name], MD      . OLANZapine (ZYPREXA) tablet 10 mg  10 mg Oral BID AC Clapacs, [name], MD   10 mg at 11/05/16 0913  . traZODone (DESYREL) tablet 100 mg  100 mg Oral QHS Clapacs, [name], MD   100 mg at 11/05/16 2202    Lab Results:  Results for orders placed or performed during the hospital encounter of 11/04/16 (from the past 48 hour(s))  Hemoglobin A1c     Status: None   Collection Time: 11/05/16 10:22 AM  Result Value Ref Range   Hgb A1c MFr Bld 5.5 4.8 - 5.6 %  Comment: (NOTE)         Pre-diabetes: 5.7 - 6.4         Diabetes: >6.4         Glycemic control for adults with diabetes: <7.0    Mean Plasma Glucose 111 mg/dL    Comment: (NOTE) Performed At: [facility] [place], [place] [id number] Lindon Romp MD [id number]   Lipid panel     Status: Abnormal   Collection Time: 11/05/16 10:22 AM  Result Value Ref Range   Cholesterol 166 0 - 200 mg/dL   Triglycerides 95 <150 mg/dL   HDL 32 (L) >40 mg/dL   Total CHOL/HDL Ratio 5.2 RATIO   VLDL 19 0 - 40 mg/dL   LDL Cholesterol 115 (H) 0 - 99 mg/dL    Comment:        Total Cholesterol/HDL:CHD Risk Coronary Heart Disease Risk Table                     Men   Women  1/2 Average Risk   3.4   3.3  Average Risk       5.0   4.4  2 X Average Risk   9.6   7.1  3 X Average Risk  23.4   11.0        Use the calculated Patient  Ratio above and the CHD Risk Table to determine the patient's CHD Risk.        ATP III CLASSIFICATION (LDL):  <100     mg/dL   Optimal  100-129  mg/dL   Near or Above                    Optimal  130-159  mg/dL   Borderline  160-189  mg/dL   High  >190     mg/dL   Very High   TSH     Status: None   Collection Time: 11/05/16 10:22 AM  Result Value Ref Range   TSH 0.697 0.350 - 4.500 uIU/mL    Comment: Performed by a 3rd Generation assay with a functional sensitivity of <=0.01 uIU/mL.    Blood Alcohol level:  Lab Results  Component Value Date   ETH <5 11/03/2016   ETH <5 38/45/3646    Metabolic Disorder Labs: Lab Results  Component Value Date   HGBA1C 5.5 11/05/2016   MPG 111 11/05/2016   No results found for: PROLACTIN Lab Results  Component Value Date   CHOL 166 11/05/2016   TRIG 95 11/05/2016   HDL 32 (L) 11/05/2016   CHOLHDL 5.2 11/05/2016   VLDL 19 11/05/2016   LDLCALC 115 (H) 11/05/2016   LDLCALC 132 (H) 04/15/2015    Physical Findings: AIMS:  , ,  ,  ,    CIWA:    COWS:     Musculoskeletal: Strength & Muscle Tone: within normal limits Gait & Station: normal Patient leans: N/A  Psychiatric Specialty Exam: Physical Exam  Nursing note and vitals reviewed. Psychiatric: His affect is blunt. His speech is delayed. He is withdrawn and actively hallucinating. Thought content is paranoid and delusional. Cognition and memory are normal. He expresses impulsivity.    Review of Systems  Psychiatric/Behavioral: Positive for hallucinations.  All other systems reviewed and are negative.   Blood pressure 121/88, pulse 79, temperature 98.7 F (37.1 C), temperature source Oral, resp. rate 18, height _0  (1.676 m), weight 81.6 kg (180 lb), SpO2 100 %.Body mass index is 29.05  kg/m.  General Appearance: Casual  Eye Contact:  None  Speech:  Clear and Coherent  Volume:  Decreased  Mood:  Depressed  Affect:  Blunt  Thought Process:  Goal Directed and Descriptions  of Associations: Intact  Orientation:  Full (Time, Place, and Person)  Thought Content:  Delusions, Hallucinations: Auditory and Paranoid Ideation  Suicidal Thoughts:  No  Homicidal Thoughts:  No  Memory:  Immediate;   Fair Recent;   Fair Remote;   Fair  Judgement:  Poor  Insight:  Lacking  Psychomotor Activity:  Psychomotor Retardation  Concentration:  Concentration: Fair and Attention Span: Fair  Recall:  [employer] of Knowledge:  Fair  Language:  Fair  Akathisia:  No  Handed:  Right  AIMS (if indicated):     Assets:  Communication Skills Desire for Improvement Financial Resources/Insurance Housing Physical Health Resilience  ADL's:  Intact  Cognition:  WNL  Sleep:  Number of Hours: 6     Treatment Plan Summary: Daily contact with patient to assess and evaluate symptoms and progress in treatment and Medication management   Benjamin Braun is a 51 year old male with a history of schizophrenia admitted in a psychotic episode in the context of medication noncompliance  1. Psychosis. He was restarted on Zyprexa zydis 10 mg twice daily for psychosis and mood stabilization. The patient adamantly refuses injectable antipsychotics explaining that this is forbidden by the Bible.   2. Anxiety. He has been off clonazepam for a while. I will not restart it. Hydroxyzine  Is available.   3. Insomnia. Trazodone is available.   4. Metabolic syndrome.Lipid panel, TSH and HgbA1C are normal.    5. EKG. normal sinus rhythm, QTC 428.  6. Disposition. He was discharged back to his apartment. He will follow up with [facility]. He would benefit from ACT team but no funds available.   Orson Slick, MD 11/06/2016, 4:22 AM

## 2016-11-07 MED ORDER — OLANZAPINE 10 MG IM SOLR: 10.0000 mg | Freq: Once | INTRAMUSCULAR | Status: DC | PRN

## 2016-11-07 MED ORDER — OLANZAPINE 5 MG PO TBDP
15.0000 mg | ORAL_TABLET | Freq: Two times a day (BID) | ORAL | Status: DC
  Administered 2016-11-07 – 2016-11-15 (×17): 15 mg via ORAL
  Filled 2016-11-07 (×17): qty 3

## 2016-11-07 NOTE — Plan of Care (Signed)
Problem: Coping: Goal: Ability to verbalize frustrations and anger appropriately will improve Outcome: Not Progressing Patient is guarded not expressing feelings to staff.

## 2016-11-07 NOTE — Plan of Care (Signed)
Problem: Coping: Goal: Ability to cope will improve Outcome: Not Progressing Pt continues to be psychotic, hallucinating, delusional, disorganized. "There was a tick in my breakfast, they're trying to give [place] all lyme disease!" Support and encouragement provided.

## 2016-11-07 NOTE — Progress Notes (Signed)
Pt observed in hallway, yelling, pointing his finger, holding his bible in hand, exclaiming something about "I was on a bus with all blacks in [employer]...." Pt appears to be agitated, unable to be redirected. Attempted to get pt to take haldol PO PRN as ordered, but pt is refusing stating, "I'm allergic to haldol, have been since 1988." MD paged, will continue to monitor.

## 2016-11-07 NOTE — BHH Group Notes (Signed)
[facility] LCSW Group Therapy  11/07/2016 2:43 PM  Type of Therapy:  Group Therapy  Participation Level:  Patient did not attend group. CSW invited patient to group.   Summary of Progress/Problems: Balance in life: Patients will discuss the concept of balance and how it looks and feels to be unbalanced. Pt will identify areas in their life that is unbalanced and ways to become more balanced. They discussed what aspects in their lives has influenced their self care. Patients also discussed self care in the areas of self regulation/control, hygiene/appearance, sleep/relaxation, healthy leisure, healthy eating habits, exercise, inner peace/spirituality, self improvement, sobriety, and health management. They were challenged to identify changes that are needed in order to improve self care.  Liz Pinho G. [place], [place]  11/07/2016, 2:43 PM

## 2016-11-07 NOTE — Progress Notes (Signed)
Pt to medication room for mid day medications, demanding a "blue pill, klonopin. I have never abused them before, they even checked my urine!" MD informed. Safety maintained. Will continue to monitor.

## 2016-11-07 NOTE — BHH Group Notes (Signed)
[facility] Group Notes:  (Nursing/MHT/Case Management/Adjunct)  Date:  11/07/2016  Time:  3:57 AM  Type of Therapy:  Psychoeducational Skills  Participation Level:  Did Not Attend  Kathi Ludwig 11/07/2016, 3:57 AM

## 2016-11-07 NOTE — Progress Notes (Addendum)
Per Dr. Einar Grad, no new orders for pt at this time, continue to monitor.   Pt currently on the phone, yelling, appears to be irritable.

## 2016-11-07 NOTE — Progress Notes (Signed)
D: Pt denies SI/HI/AVH but noted responding to internal stimuli.  Patient's affect is blunted, mood is tense, patient was also noted acting bizarre staring at people and not interacting appropriately with peers and staff.  A: Pt was offered support and encouragement. Pt was given scheduled medications. Pt was encouraged to attend groups. Q 15 minute checks were done for safety.  R:Pt did not attend evening group. Pt is taking medication. Pt  Is not receptive to treatment, safety maintained on unit.

## 2016-11-07 NOTE — Progress Notes (Signed)
Pt came from breakfast reporting, "I think there was a bug in my food. Maybe it was just a blueberry. It looked red on the inside. She said it wasn't a bug, but she's lying. She's going to meet the resurrector." Pt unable to be redirected regarding his thoughts of a bug in his breakfast. Approaches multiple staff members regarding this issues. Pt obviously hallucinating, delusional. Support and encouragement provided. Safety maintained with every 15 minute checks. Will continue to monitor.

## 2016-11-07 NOTE — BHH Group Notes (Signed)
[facility] LCSW Group Therapy Note  Type of Therapy and Topic:  Group Therapy:  Goals Group: SMART Goals  Participation Level:  Patient did not attend group. CSW invited patient to group.   Description of Group:   The purpose of a daily goals group is to assist and guide patients in setting recovery/wellness-related goals.  The objective is to set goals as they relate to the crisis in which they were admitted. Patients will be using SMART goal modalities to set measurable goals.  Characteristics of realistic goals will be discussed and patients will be assisted in setting and processing how one will reach their goal. Facilitator will also assist patients in applying interventions and coping skills learned in psycho-education groups to the SMART goal and process how one will achieve defined goal.  Therapeutic Goals: -Patients will develop and document one goal related to or their crisis in which brought them into treatment. -Patients will be guided by LCSW using SMART goal setting modality in how to set a measurable, attainable, realistic and time sensitive goal.  -Patients will process barriers in reaching goal. -Patients will process interventions in how to overcome and successful in reaching goal.   Summary of Patient Progress:  Patient Goal: Patient did not attend group. CSW invited patient to group.    Therapeutic Modalities:   Motivational Interviewing Public relations account executive Therapy Crisis Intervention Model SMART goals setting  Randa Riss G. [place] Jabs MSW, [facility] 11/07/2016 10:25 AM

## 2016-11-07 NOTE — Progress Notes (Signed)
[facility] MD Progress Note  11/07/2016 11:42 AM Benjamin Braun  MRN:  [id number]  Subjective:   11/06/2016. Benjamin Braun met with treatment team this morning. He is up and about today. He is extremely paranoid and delusional to the point of disorganization. He is uncertain if he still has appartment to go to. He took medications but already started asking about Klonopin. There are no somatic complaints. He is short of hearing but wears his hearing aid.  11/07/2016. Benjamin Braun and continues to be very paranoid. He talks loudly to his voices. He demands clonazepam that was prescribed last time while in the hospital. At that time the patient did have a psychiatrist in the community to continue. He has not seen psychiatrist in 2 years. I would not prescribe clonazepam as we were completely unsure if he has a follow-up in the community. Apparently no family in this area works with his insurance. He reports that Zyprexa and leaves him dry mouth.  Per nursing: D: Pt denies SI/HI/AVH but noted responding to internal stimuli.  Patient's affect is blunted, mood is tense, patient was also noted acting bizarre staring at people and not interacting appropriately with peers and staff.  A: Pt was offered support and encouragement. Pt was given scheduled medications. Pt was encouraged to attend groups. Q 15 minute checks were done for safety.  R:Pt did not attend evening group. Pt is taking medication. Pt  Is not receptive to treatment, safety maintained on unit.  Principal Problem: Undifferentiated schizophrenia ([place]) Diagnosis:   Patient Active Problem List   Diagnosis Date Noted  . Undifferentiated schizophrenia ([place]) [F20.3] 11/04/2016  . Noncompliance [Z91.19] 04/14/2015   Total Time spent with patient: 30 minutes  Past Psychiatric History: schizophrenia.  Past Medical History:  Past Medical History:  Diagnosis Date  . Anxiety   . Generalized anxiety disorder   . Schizophrenia [facility])     Past Surgical  History:  Procedure Laterality Date  . TONSILLECTOMY     Family History:  Family History  Problem Relation Age of Onset  . Family history unknown: Yes   Family Psychiatric  History: unknown. Social History:  History  Alcohol Use No     History  Drug Use No    Social History   Social History  . Marital status: Single    Spouse name: N/A  . Number of children: N/A  . Years of education: N/A   Social History Main Topics  . Smoking status: Former Smoker    Types: Cigarettes    Quit date: 06/11/1999  . Smokeless tobacco: Never Used  . Alcohol use No  . Drug use: No  . Sexual activity: No   Other Topics Concern  . None   Social History Narrative  . None   Additional Social History:                         Sleep: Fair  Appetite:  Fair  Current Medications: Current Facility-Administered Medications  Medication Dose Route Frequency Provider Last Rate Last Dose  . acetaminophen (TYLENOL) tablet 650 mg  650 mg Oral Q6H PRN Clapacs, [name], MD      . alum & mag hydroxide-simeth (MAALOX/MYLANTA) 200-200-20 MG/5ML suspension 30 mL  30 mL Oral Q4H PRN Clapacs, [name], MD      . cyclobenzaprine (FLEXERIL) tablet 5 mg  5 mg Oral TID Kaelin Holford B, MD   5 mg at 11/07/16 1128  . haloperidol (HALDOL)  tablet 1 mg  1 mg Oral Q6H PRN Clapacs, [name], MD      . ibuprofen (ADVIL,MOTRIN) tablet 600 mg  600 mg Oral Q6H PRN Clapacs, [name], MD   600 mg at 11/06/16 0959  . magnesium hydroxide (MILK OF MAGNESIA) suspension 30 mL  30 mL Oral Daily PRN Clapacs, [name], MD      . OLANZapine (ZYPREXA) tablet 10 mg  10 mg Oral BID AC Clapacs, [name], MD   10 mg at 11/07/16 0750  . traZODone (DESYREL) tablet 100 mg  100 mg Oral QHS Clapacs, [name], MD   100 mg at 11/06/16 2137    Lab Results:  No results found for this or any previous visit (from the past 48 hour(s)).  Blood Alcohol level:  Lab Results  Component Value Date   ETH <5 11/03/2016   ETH <5 76/22/6333     Metabolic Disorder Labs: Lab Results  Component Value Date   HGBA1C 5.5 11/05/2016   MPG 111 11/05/2016   No results found for: PROLACTIN Lab Results  Component Value Date   CHOL 166 11/05/2016   TRIG 95 11/05/2016   HDL 32 (L) 11/05/2016   CHOLHDL 5.2 11/05/2016   VLDL 19 11/05/2016   LDLCALC 115 (H) 11/05/2016   LDLCALC 132 (H) 04/15/2015    Physical Findings: AIMS:  , ,  ,  ,    CIWA:    COWS:     Musculoskeletal: Strength & Muscle Tone: within normal limits Gait & Station: normal Patient leans: N/A  Psychiatric Specialty Exam: Physical Exam  Nursing note and vitals reviewed. Psychiatric: His affect is blunt. His speech is delayed. He is withdrawn and actively hallucinating. Thought content is paranoid and delusional. Cognition and memory are normal. He expresses impulsivity.    Review of Systems  Psychiatric/Behavioral: Positive for hallucinations.  All other systems reviewed and are negative.   Blood pressure 120/89, pulse 88, temperature 98.5 F (36.9 C), resp. rate 18, height _0  (1.676 m), weight 81.6 kg (180 lb), SpO2 100 %.Body mass index is 29.05 kg/m.  General Appearance: Casual  Eye Contact:  None  Speech:  Clear and Coherent  Volume:  Decreased  Mood:  Depressed  Affect:  Blunt  Thought Process:  Goal Directed and Descriptions of Associations: Intact  Orientation:  Full (Time, Place, and Person)  Thought Content:  Delusions, Hallucinations: Auditory and Paranoid Ideation  Suicidal Thoughts:  No  Homicidal Thoughts:  No  Memory:  Immediate;   Fair Recent;   Fair Remote;   Fair  Judgement:  Poor  Insight:  Lacking  Psychomotor Activity:  Psychomotor Retardation  Concentration:  Concentration: Fair and Attention Span: Fair  Recall:  [employer] of Knowledge:  Fair  Language:  Fair  Akathisia:  No  Handed:  Right  AIMS (if indicated):     Assets:  Communication Skills Desire for Improvement Financial  Resources/Insurance Housing Physical Health Resilience  ADL's:  Intact  Cognition:  WNL  Sleep:  Number of Hours: 6.5     Treatment Plan Summary: Daily contact with patient to assess and evaluate symptoms and progress in treatment and Medication management   Benjamin Braun is a 51 year old male with a history of schizophrenia admitted in a psychotic episode in the context of medication noncompliance  1. Psychosis. He was restarted on Zyprexa zydis 10 mg twice daily for psychosis and mood stabilization. The patient adamantly refuses injectable antipsychotics explaining that this is forbidden by  the Bible.   2. Anxiety. He has been off clonazepam for a while. I will not restart it. Hydroxyzine  Is available.   3. Insomnia. Trazodone is available.   4. Metabolic syndrome.Lipid panel, TSH and HgbA1C are normal.    5. EKG. normal sinus rhythm, QTC 428.  6. Back pain. We restarted Flexeril.   7. Disposition. He was discharged back to his apartment. He will follow up with [facility]. He would benefit from ACT team but no funds available.   Orson Slick, MD 11/07/2016, 11:42 AM

## 2016-11-08 MED ORDER — OLANZAPINE 10 MG IM SOLR: 10.0000 mg | Freq: Two times a day (BID) | INTRAMUSCULAR | Status: DC | PRN

## 2016-11-08 MED ORDER — DIVALPROEX SODIUM 500 MG PO DR TAB
500.0000 mg | DELAYED_RELEASE_TABLET | Freq: Three times a day (TID) | ORAL | Status: DC
  Administered 2016-11-08 – 2016-11-12 (×14): 500 mg via ORAL
  Filled 2016-11-08 (×15): qty 1

## 2016-11-08 NOTE — BHH Group Notes (Signed)
[facility] LCSW Group Therapy  11/08/2016 2:32 PM  Type of Therapy:  Group Therapy  Participation Level:  Patient did not attend group. CSW invited patient to group.   Summary of Progress/Problems: Safety Planning: Patients identified fears or worries surrounding discharge. Patients offered support to their peers and openly developed safety plans for their individual needs. Patients developed their own safety plan. Patients discussed their warning signs, coping strategies, support system with family and friends, identified mental health professionals, and how to keep their environments safe (ex. Removing unnecessary medications or removing weapons/guns). Patients then discussed their personalized safety plan with the group.   Benjamin Boursiquot G. [place], [place] 11/08/2016, 2:33 PM

## 2016-11-08 NOTE — BHH Suicide Risk Assessment (Signed)
[facility] INPATIENT:  Family/Significant Other Suicide Prevention Education  Suicide Prevention Education:  Education Completed; brother, Rafiel Mecca ph#: [phone] has been identified by the patient as the family member/significant other with whom the patient will be residing, and identified as the person(s) who will aid the patient in the event of a mental health crisis (suicidal ideations/suicide attempt).  With written consent from the patient, the family member/significant other has been provided the following suicide prevention education, prior to the and/or following the discharge of the patient.  The suicide prevention education provided includes the following:  Suicide risk factors  Suicide prevention and interventions  National Suicide Hotline telephone number  [facility] assessment telephone number  [facility] Emergency Assistance [place] and/or Residential Mobile Crisis Unit telephone number  Request made of family/significant other to:  Remove weapons (e.g., guns, rifles, knives), all items previously/currently identified as safety concern.    Remove drugs/medications (over-the-counter, prescriptions, illicit drugs), all items previously/currently identified as a safety concern.  The family member/significant other verbalizes understanding of the suicide prevention education information provided.  The family member/significant other agrees to remove the items of safety concern listed above.  Emilie Rutter, MSW, LCSW-A  11/08/2016, 1:10 PM

## 2016-11-08 NOTE — Progress Notes (Signed)
Patient observed in hallway talking to himself stating, "I don't know why he came down here but he can come on though." Patient also seemed agitated this morning during medication pass stating, "Is this poison too?" Nurse explained to client the medications that he was taking and the reason for taking them. Patient stated that he had a little pain in both of his great toes (big toe) but did not want to take any medicine for it. Patient stated, "I reckon it is just the poison that spilled onto it." Nurse assessed the area that patient indicated that was hurting, no redness nor open areas noted. Patient attended meals in the community room but he was not observed  engaging in conversations with his peers. Patient denies SI/HI Will continue to monitor.

## 2016-11-08 NOTE — Progress Notes (Addendum)
Patient irritable, paranoid, and angry throughout the shift.  Patient has a delusion that we are trying to poison him.  Patient believes that poisonous gas is coming through the vents.  After taking Trazodone he stated it made him want to rip his skin off.  He also believes that the water and orange juice has been poisoned.  Staff attempted to reorient patient with no success.  During reorientation writer was accused of breaking one of the 10 commandments for lying about the poisonous gas.  Patient paranoia and anger increased throughout the shift.  Patient eventually went into his room and slept through the night.

## 2016-11-08 NOTE — Plan of Care (Signed)
Problem: Safety: Goal: Ability to remain free from injury will improve Outcome: Not Progressing Patient had to be reminded on several occasions to keep his shoes on during the shift. Explained to the client that shoes needed to be worn at all times to prevent injury and infection to his feet. When this writer went into the patient's room, pt was observed lying in the bed with his shoes on. This Probation officer informed client that his shoes should not be worn in the bed, only when he is up walking.

## 2016-11-08 NOTE — Progress Notes (Signed)
Recreation Therapy Notes  Date: 06.01.18 Time: 9:30 am Location: Craft Room  Group Topic: Coping Skills  Goal Area(s) Addresses:  Patient will participate in healthy coping skill. Patient will verbalize benefit of using art as a coping skill.  Behavioral Response: Did not attend  Intervention: Coloring  Activity: Patients were given coloring sheets to color and were instructed to think about what emotions they were feeling as well as what their minds were focused on.  Education: LRT educated patients on healthy coping skills.  Education Outcome: Patient did not attend group.   Clinical Observations/Feedback: Patient did not attend group.  Leonette Monarch, LRT/CTRS 11/08/2016 10:32 AM
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
## 2016-11-09 MED ORDER — BENZTROPINE MESYLATE 1 MG PO TABS
1.0000 mg | ORAL_TABLET | Freq: Two times a day (BID) | ORAL | Status: DC
  Administered 2016-11-09 – 2016-11-10 (×3): 1 mg via ORAL
  Filled 2016-11-09 (×3): qty 1

## 2016-11-09 MED ORDER — HYDROXYZINE HCL 25 MG PO TABS
25.0000 mg | ORAL_TABLET | Freq: Three times a day (TID) | ORAL | Status: DC | PRN
  Administered 2016-11-09 – 2016-11-15 (×3): 25 mg via ORAL
  Filled 2016-11-09 (×3): qty 1

## 2016-11-09 NOTE — BHH Group Notes (Signed)
[employer] Group Notes:  (Nursing/MHT/Case Management/Adjunct)  Date:  11/09/2016  Time:  11:27 PM  Type of Therapy:  Psychoeducational Skills  Participation Level:  Did Not Attend  Summary of Progress/Problems:  Benjamin Braun 11/09/2016, 11:27 PM

## 2016-11-09 NOTE — Plan of Care (Signed)
Problem: Coping: Goal: Ability to cope will improve Outcome: Not Progressing Patient was approached early in shift and was observed to be calm while reclining on his bed.  When he came to the medication room after wrap up meeting, he was hostile and verbalizing about "religion, demons, and witches".  He did take his medications and identified the medication names. He was approached several times this shift and each time he made sarcastic incongruent remarks.  He was seen responding to unseen others although he denied hearing voices and became more tense/angry when asked.

## 2016-11-09 NOTE — Progress Notes (Signed)
Pt seen in hallway responding to internal stimuli. Pt was agitated around 1400 prn dose of haldol given. Prn medication effective. Pt very disorganized with a blunted affect. Pt is medication compliant. Will continue to monitor.

## 2016-11-09 NOTE — Progress Notes (Signed)
[facility] MD Progress Note  11/09/2016 1:53 PM Benjamin Braun  MRN:  [id number]  Subjective: [name] is a 51 year old male with a history of schizophrenia admitted floridly psychotic, paranoid and hallucinating, in the context of medication noncompliance. He usually responds well to Zyprexa but has been more agitated, possibly cheeking medications. He refuses injectable antipsychotics. Lives independently.   11/09/2016. Pt continue to agitate at times, refused am Depakote, pt disorganized, taking to self at times. States he refused meds because he is on too many meds, limited insight and judgement. Asking meds for "nerves and shaking"  Per nursing: Patient observed in hallway talking to himself stating, "I don't know why he came down here but he can come on though." Patient also seemed agitated this morning during medication pass stating, "Is this poison too?" Nurse explained to client the medications that he was taking and the reason for taking them. Patient stated that he had a little pain in both of his great toes (big toe) but did not want to take any medicine for it. Patient stated, "I reckon it is just the poison that spilled onto it." Nurse assessed the area that patient indicated that was hurting, no redness nor open areas noted. Patient attended meals in the community room but he was not observed  engaging in conversations with his peers. Patient denies SI/HI  Patient had to be reminded on several occasions to keep his shoes on during the shift. Explained to the client that shoes needed to be worn at all times to prevent injury and infection to his feet. When this writer went into the patient's room, pt was observed lying in the bed with his shoes on. This Probation officer informed client that his shoes should not be worn in the bed, only when he is up walking   Principal Problem: Undifferentiated schizophrenia ([place]) Diagnosis:   Patient Active Problem List   Diagnosis Date Noted  . Undifferentiated  schizophrenia ([place]) [F20.3] 11/04/2016  . Noncompliance [Z91.19] 04/14/2015   Total Time spent with patient: 30 minutes  Past Psychiatric History: schizophrenia.  Past Medical History:  Past Medical History:  Diagnosis Date  . Anxiety   . Generalized anxiety disorder   . Schizophrenia [facility])     Past Surgical History:  Procedure Laterality Date  . TONSILLECTOMY     Family History:  Family History  Problem Relation Age of Onset  . Family history unknown: Yes   Family Psychiatric  History: unknown. Social History:  History  Alcohol Use No     History  Drug Use No    Social History   Social History  . Marital status: Single    Spouse name: N/A  . Number of children: N/A  . Years of education: N/A   Social History Main Topics  . Smoking status: Former Smoker    Types: Cigarettes    Quit date: 06/11/1999  . Smokeless tobacco: Never Used  . Alcohol use No  . Drug use: No  . Sexual activity: No   Other Topics Concern  . None   Social History Narrative  . None   Additional Social History:                         Sleep: Fair  Appetite:  Fair  Current Medications: Current Facility-Administered Medications  Medication Dose Route Frequency Provider Last Rate Last Dose  . acetaminophen (TYLENOL) tablet 650 mg  650 mg Oral Q6H PRN Clapacs, [name], MD      .  alum & mag hydroxide-simeth (MAALOX/MYLANTA) 200-200-20 MG/5ML suspension 30 mL  30 mL Oral Q4H PRN Clapacs, [name], MD      . cyclobenzaprine (FLEXERIL) tablet 5 mg  5 mg Oral TID Pucilowska, Jolanta B, MD   5 mg at 11/09/16 1137  . divalproex (DEPAKOTE) DR tablet 500 mg  500 mg Oral Q8H Pucilowska, Jolanta B, MD   500 mg at 11/09/16 1020  . haloperidol (HALDOL) tablet 1 mg  1 mg Oral Q6H PRN Clapacs, [name], MD      . ibuprofen (ADVIL,MOTRIN) tablet 600 mg  600 mg Oral Q6H PRN Clapacs, [name], MD   600 mg at 11/06/16 0959  . magnesium hydroxide (MILK OF MAGNESIA) suspension 30 mL  30 mL Oral Daily  PRN Clapacs, [name], MD      . OLANZapine (ZYPREXA) injection 10 mg  10 mg Intramuscular BID PRN Pucilowska, Jolanta B, MD      . OLANZapine zydis (ZYPREXA) disintegrating tablet 15 mg  15 mg Oral BID Pucilowska, Jolanta B, MD   15 mg at 11/09/16 0819  . traZODone (DESYREL) tablet 100 mg  100 mg Oral QHS Clapacs, [name], MD   100 mg at 11/08/16 2139    Lab Results:  No results found for this or any previous visit (from the past 48 hour(s)).  Blood Alcohol level:  Lab Results  Component Value Date   ETH <5 11/03/2016   ETH <5 81/82/9937    Metabolic Disorder Labs: Lab Results  Component Value Date   HGBA1C 5.5 11/05/2016   MPG 111 11/05/2016   No results found for: PROLACTIN Lab Results  Component Value Date   CHOL 166 11/05/2016   TRIG 95 11/05/2016   HDL 32 (L) 11/05/2016   CHOLHDL 5.2 11/05/2016   VLDL 19 11/05/2016   LDLCALC 115 (H) 11/05/2016   LDLCALC 132 (H) 04/15/2015    Physical Findings: AIMS:  , ,  ,  ,    CIWA:    COWS:     Musculoskeletal: Strength & Muscle Tone: within normal limits Gait & Station: normal Patient leans: N/A  Psychiatric Specialty Exam: Physical Exam  Nursing note and vitals reviewed. Respiratory: Effort normal.  Neurological: He is alert.  Psychiatric: His affect is blunt. His speech is delayed. He is withdrawn and actively hallucinating. Thought content is paranoid and delusional. Cognition and memory are normal. He expresses impulsivity.    Review of Systems  Psychiatric/Behavioral: Positive for hallucinations.  All other systems reviewed and are negative.   Blood pressure 112/68, pulse 80, temperature 97.7 F (36.5 C), temperature source Oral, resp. rate 16, height 5\' 6"  (1.676 m), weight 81.6 kg (180 lb), SpO2 100 %.Body mass index is 29.05 kg/m.  General Appearance: Casual  Eye Contact:  None  Speech:  Clear and Coherent  Volume:  Loud at times  Mood:  Depressed  Affect:  Labile, irritable  Thought Process:  Goal  Directed and Descriptions of Associations: Intact  Orientation:  Full (Time, Place, and Person)  Thought Content:  Delusions, Hallucinations: Auditory and Paranoid Ideation  Suicidal Thoughts:  No  Homicidal Thoughts:  No  Memory:  Immediate;   Fair Recent;   Fair Remote;   Fair  Judgement:  Poor  Insight:  Lacking  Psychomotor Activity:  Psychomotor Retardation  Concentration:  Concentration: Fair and Attention Span: Fair  Recall:  [employer] of Knowledge:  Fair  Language:  Fair  Akathisia:  No  Handed:  Right  AIMS (if indicated):     Assets:  Communication Skills Desire for Improvement Financial Resources/Insurance Housing Physical Health Resilience  ADL's:  Intact  Cognition:  WNL  Sleep:  Number of Hours: 7.25     Treatment Plan Summary: Daily contact with patient to assess and evaluate symptoms and progress in treatment and Medication management   Mr. Stidd is a 52 year old male with a history of schizophrenia admitted in a psychotic episode in the context of medication noncompliance Add cogentin for EPS prevention, Vistaril prn for anxiety.   1. Psychosis. He was switched to Zyprexa zydis 15 mg twice daily for psychosis. Zyprexa injection is available for agitation. We will add depakote to stabilize mood. The patient adamantly refuses injectable antipsychotics explaining that this is forbidden by the Bible.    2. Anxiety. He has been off clonazepam for a while.  will not restart it.   3. Insomnia. Trazodone is available.   4. Metabolic syndrome.Lipid panel, TSH and HgbA1C are normal.    5. EKG. normal sinus rhythm, QTC 428.  6. Back pain. Flexeril.   7. Disposition. TBE. He will likely be discharged back to his apartment. So far we were unable to identify any providers in the area who work with his insurance.   Benjamin Chancellor, MD 11/09/2016, 1:53 PMPatient ID: Benjamin Braun, male   DOB: June 22, 1965, 51 y.o.   MRN: [id number]

## 2016-11-09 NOTE — BHH Group Notes (Signed)
[place] LCSW Group Therapy  11/09/2016 2:13 PM  Type of Therapy:  Group Therapy  Participation Level:  Patient did not attend group. CSW invited patient to group.   Summary of Progress/Problems: Boundaries: Patients defined boundaries and discussed the importance having clear boundaries within their relationships. Patients identified their own boundaries. Patients established limits and rules within relationships both personally and professionally. Patients discussed ways to create and/ or improve their personal boundaries and utilizing assertive communications skills.   Sybil Shrader G. [place], [place] 11/09/2016, 2:15 PM

## 2016-11-09 NOTE — Plan of Care (Signed)
Problem: Safety: Goal: Ability to redirect hostility and anger into socially appropriate behaviors will improve Outcome: Not Progressing Pt received prn haldol for agitation pt was not able to calm self down.

## 2016-11-10 MED ORDER — HALOPERIDOL 5 MG PO TABS
5.0000 mg | ORAL_TABLET | Freq: Two times a day (BID) | ORAL | Status: DC
  Administered 2016-11-10 – 2016-11-12 (×5): 5 mg via ORAL
  Filled 2016-11-10 (×5): qty 1

## 2016-11-10 NOTE — Progress Notes (Signed)
[facility] MD Progress Note  11/10/2016 2:00 PM Benjamin Braun  MRN:  [id number]  Subjective: [name] is a 51 year old male with a history of schizophrenia admitted floridly psychotic, paranoid and hallucinating, in the context of medication noncompliance. He usually responds well to Zyprexa but has been more agitated, possibly cheeking medications. He refuses injectable antipsychotics. Lives independently.   11/10/2016. Pt continue to agitate at times, received prn haldol yesterday for agitation. Pt taking to self, reluctant to take meds.   limited insight and judgement.   Per nursing: Pt seen in hallway responding to internal stimuli. Pt was agitated around 1400 prn dose of haldol given. Prn medication effective. Pt very disorganized with a blunted affect. Pt is medication compliant. Patient was approached early in shift and was observed to be calm while reclining on his bed.  When he came to the medication room after wrap up meeting, he was hostile and verbalizing about "religion, demons, and witches".  He did take his medications and identified the medication names. He was approached several times this shift and each time he made sarcastic incongruent remarks.  He was seen responding to unseen others although he denied hearing voices and became more tense/angry when asked.  Principal Problem: Undifferentiated schizophrenia ([place]) Diagnosis:   Patient Active Problem List   Diagnosis Date Noted  . 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 Noncompliance [Z91.19] 04/14/2015   Total Time spent with patient: 30 minutes  Past Psychiatric History: schizophrenia.  Past Medical History:  Past Medical History:  Diagnosis Date  . Anxiety   . Generalized anxiety disorder   . Schizophrenia [facility])     Past Surgical History:  Procedure Laterality Date  . TONSILLECTOMY     Family History:  Family History  Problem Relation Age of Onset  . Family history unknown: Yes   Family  Psychiatric  History: unknown. Social History:  History  Alcohol Use No     History  Drug Use No    Social History   Social History  . Marital status: Single    Spouse name: N/A  . Number of children: N/A  . Years of education: N/A   Social History Main Topics  . Smoking status: Former Smoker    Types: Cigarettes    Quit date: 06/11/1999  . Smokeless tobacco: Never Used  . Alcohol use No  . Drug use: No  . Sexual activity: No   Other Topics Concern  . None   Social History Narrative  . None   Additional Social History:                         Sleep: Fair  Appetite:  Fair  Current Medications: Current Facility-Administered Medications  Medication Dose Route Frequency Provider Last Rate Last Dose  . acetaminophen (TYLENOL) tablet 650 mg  650 mg Oral Q6H PRN Clapacs, [name], MD      . alum & mag hydroxide-simeth (MAALOX/MYLANTA) 200-200-20 MG/5ML suspension 30 mL  30 mL Oral Q4H PRN Clapacs, [name], MD      . benztropine (COGENTIN) tablet 1 mg  1 mg Oral BID Lenward Chancellor, MD   1 mg at 11/10/16 0840  . cyclobenzaprine (FLEXERIL) tablet 5 mg  5 mg Oral TID Pucilowska, Jolanta B, MD   5 mg at 11/10/16 1202  . divalproex (DEPAKOTE) DR tablet 500 mg  500 mg Oral Q8H Pucilowska, Jolanta B, MD   500 mg at 11/10/16 0648  . haloperidol (  HALDOL) tablet 1 mg  1 mg Oral Q6H PRN Clapacs, [name], MD   1 mg at 11/09/16 1401  . hydrOXYzine (ATARAX/VISTARIL) tablet 25 mg  25 mg Oral TID PRN Lenward Chancellor, MD   25 mg at 11/09/16 2019  . ibuprofen (ADVIL,MOTRIN) tablet 600 mg  600 mg Oral Q6H PRN Clapacs, [name], MD   600 mg at 11/06/16 0959  . magnesium hydroxide (MILK OF MAGNESIA) suspension 30 mL  30 mL Oral Daily PRN Clapacs, [name], MD      . OLANZapine (ZYPREXA) injection 10 mg  10 mg Intramuscular BID PRN Pucilowska, Jolanta B, MD      . OLANZapine zydis (ZYPREXA) disintegrating tablet 15 mg  15 mg Oral BID Pucilowska, Jolanta B, MD   15 mg at 11/10/16 0840  .  traZODone (DESYREL) tablet 100 mg  100 mg Oral QHS Clapacs, [name], MD   100 mg at 11/09/16 2018    Lab Results:  No results found for this or any previous visit (from the past 48 hour(s)).  Blood Alcohol level:  Lab Results  Component Value Date   ETH <5 11/03/2016   ETH <5 02/77/4128    Metabolic Disorder Labs: Lab Results  Component Value Date   HGBA1C 5.5 11/05/2016   MPG 111 11/05/2016   No results found for: PROLACTIN Lab Results  Component Value Date   CHOL 166 11/05/2016   TRIG 95 11/05/2016   HDL 32 (L) 11/05/2016   CHOLHDL 5.2 11/05/2016   VLDL 19 11/05/2016   LDLCALC 115 (H) 11/05/2016   LDLCALC 132 (H) 04/15/2015    Physical Findings: AIMS:  , ,  ,  ,    CIWA:    COWS:     Musculoskeletal: Strength & Muscle Tone: within normal limits Gait & Station: normal Patient leans: N/A  Psychiatric Specialty Exam: Physical Exam  Nursing note and vitals reviewed. Respiratory: Effort normal.  Neurological: He is alert.  Psychiatric: His affect is blunt. His speech is delayed. He is withdrawn and actively hallucinating. Thought content is paranoid and delusional. Cognition and memory are normal. He expresses impulsivity.    Review of Systems  Psychiatric/Behavioral: Positive for hallucinations.  All other systems reviewed and are negative.   Blood pressure 129/74, pulse (!) 119, temperature 97.8 F (36.6 C), temperature source Oral, resp. rate 18, height 5\' 6"  (1.676 m), weight 81.6 kg (180 lb), SpO2 100 %.Body mass index is 29.05 kg/m.  General Appearance: Casual  Eye Contact:  None  Speech:  Clear and Coherent  Volume:  Loud at times  Mood:  Depressed  Affect:  Labile, irritable  Thought Process:  Goal Directed and Descriptions of Associations: Intact  Orientation:  Full (Time, Place, and Person)  Thought Content:  Delusions, Hallucinations: Auditory and Paranoid Ideation, taking and smiling to self   Suicidal Thoughts:  No  Homicidal Thoughts:  No   Memory:  Immediate;   Fair Recent;   Fair Remote;   Fair  Judgement:  Poor  Insight:  Lacking  Psychomotor Activity:  Psychomotor Retardation  Concentration:  Concentration: Fair and Attention Span: Fair  Recall:  [employer] of Knowledge:  Fair  Language:  Fair  Akathisia:  No  Handed:  Right  AIMS (if indicated):     Assets:  Communication Skills Desire for Improvement Financial Resources/Insurance Housing Physical Health Resilience  ADL's:  Intact  Cognition:  WNL  Sleep:  Number of Hours: 8.3     Treatment Plan  Summary: Daily contact with patient to assess and evaluate symptoms and progress in treatment and Medication management   [name] is a 51 year old male with a history of schizophrenia admitted in a psychotic episode in the context of medication noncompliance Add cogentin for EPS prevention, Vistaril prn for anxiety.   1. Psychosis. He was switched to Zyprexa zydis 15 mg twice daily for psychosis. Zyprexa injection is available for agitation. We will add depakote to stabilize mood. The patient adamantly refuses injectable antipsychotics explaining that this is forbidden by the Bible.    2. Anxiety. He has been off clonazepam for a while.  will not restart it.   3. Insomnia. Trazodone is available.   4. Metabolic syndrome.Lipid panel, TSH and HgbA1C are normal.    5. EKG. normal sinus rhythm, QTC 428.  6. Back pain. Flexeril.   7. Disposition. TBE. He will likely be discharged back to his apartment. So far we were unable to identify any providers in the area who work with his insurance.   Lenward Chancellor, MD 11/10/2016, 2:00 PMPatient ID: Benjamin Braun, male   DOB: [birth year], 51 y.o.   MRN: [id number] Patient ID: Benjamin Braun, male   DOB: 07/03/1965, 51 y.o.   MRN: [id number]

## 2016-11-10 NOTE — Progress Notes (Signed)
Patient observed pacing the hall talking to self. During the med pass this morning, patient was observed responding to internal stimuli, talking and laughing to self. He however took his medications without any issues. Denied thoughts of self harm and contracted for safety. He does require redirection from time to time when he appears stranded and preoccupied. Will maintain frequent contact with him to ensure safety.

## 2016-11-10 NOTE — Plan of Care (Signed)
Problem: Health Behavior/Discharge Planning: Goal: Compliance with treatment plan for underlying cause of condition will improve Outcome: Progressing Medication compliant with encouragement.

## 2016-11-10 NOTE — Plan of Care (Signed)
Problem: Coping: Goal: Ability to identify and develop effective coping behavior will improve Outcome: Progressing Patient spent time in the dayroom with peers.  He worked on his laundry this evening and watched television.  He discussed his interest in tigers and "big cats" and recited information on wild animals in a rational manner.  He was observed talking to himself at times when walking in the hall but was not seen to be hostile toward staff or peers.

## 2016-11-10 NOTE — BHH Group Notes (Signed)
[place] LCSW Group Therapy  11/10/2016 2:43 PM  Type of Therapy:  Group Therapy  Participation Level:  Patient did not attend group. CSW invited patient to group.   Summary of Progress/Problems: Developing Gratitude/Positive Thoughts- Group facilitator defined what gratitude is and how it relates to building healthy relationships. Patients were asked to write two journal entries on the following topics: "Five things I am grateful for" and "Having a positive attitude can change my life by". Patients shared their responses with the group and discussed how this can impact mental wellbeing. Patients developed understanding about the impact of thinking positively and how it can improve happiness and self-esteem.   Jasma Seevers G. [place], [place] 11/10/2016, 2:43 PM

## 2016-11-11 NOTE — Progress Notes (Signed)
Recreation Therapy Notes  Date: 06.04.18 Time: 9:30 am Location: Craft Room  Group Topic: Self-expression  Goal Area(s) Addresses:  Patient will be able to identify a color that represents each emotion. Patient will verbalize benefit of using art as a means of self-expression. Patient will verbalize one emotion experienced while participating in activity.  Behavioral Response: Did not attend  Intervention: The Colors Within Me  Activity: Patients were given a blank face worksheet and were instructed to pick a color for each emotion they were feeling and show on the worksheet how much of that emotion they were feeling.  Education: LRT educated patients on other forms of self-expression.  Education Outcome: Patient did not attend group.  Clinical Observations/Feedback: Patient did not attend group.  Leonette Monarch, LRT/CTRS 11/11/2016 10:16 AM

## 2016-11-11 NOTE — Plan of Care (Signed)
Problem: Education: Goal: Will be free of psychotic symptoms Outcome: Not Progressing Patient continues to talk to unseen persons.

## 2016-11-11 NOTE — B[facility] Group Notes (Signed)
[place] Group Notes:  (Nursing/MHT/Case Management/Adjunct)  Date:  11/11/2016  Time:  4:42 AM  Type of Therapy:  Group Therapy  Participation Level:  Active  Participation Quality:  Attentive  Affect:  Appropriate  Cognitive:  Alert  Insight:  Appropriate  Engagement in Group:  Engaged  Modes of Intervention:  n/a  Summary of Progress/Problems:   Marylynn Pearson 11/11/2016, 4:42 AM

## 2016-11-11 NOTE — Progress Notes (Signed)
[facility] MD Progress Note  11/11/2016 9:36 AM KAY RICCIUTI  MRN:  [id number]  Subjective: Mr. Friesen is a 51 year old male with a history of schizophrenia admitted floridly psychotic, paranoid and hallucinating, in the context of medication noncompliance. He usually responds well to Zyprexa but has been more agitated, possibly cheeking medications. He refuses injectable antipsychotics. Lives independently.  [id number]. Mr. Sorter is asleep and hard to awake this morning. Last night I started Haldol in addition to Zyprexa as the patient continued to be very paranoid, delusional and hallucinating. He received a dose of Haldol on Saturday for agitation. The patient refuses injectable antipsychotics but has not been compliant with psychiatric care. We have difficilties finding him a local provider due to his insurance. We plan on giving him Haldol decanoate injection at discharge.  Per nursing: Patient spent time in the dayroom with peers.  He worked on his laundry this evening and watched television.  He discussed his interest in tigers and "big cats" and recited information on wild animals in a rational manner.  He was observed talking to himself at times when walking in the hall but was not seen to be hostile toward staff or peers.  Principal Problem: Undifferentiated schizophrenia ([place]) Diagnosis:   Patient Active Problem List   Diagnosis Date Noted  . Undifferentiated schizophrenia ([place]) [F20.3] 11/04/2016  . Noncompliance [Z91.19] 04/14/2015   Total Time spent with patient: 30 minutes  Past Psychiatric History: schizophrenia.  Past Medical History:  Past Medical History:  Diagnosis Date  . Anxiety   . Generalized anxiety disorder   . Schizophrenia [facility])     Past Surgical History:  Procedure Laterality Date  . TONSILLECTOMY     Family History:  Family History  Problem Relation Age of Onset  . Family history unknown: Yes   Family Psychiatric  History: unknown. Social History:   History  Alcohol Use No     History  Drug Use No    Social History   Social History  . Marital status: Single    Spouse name: N/A  . Number of children: N/A  . Years of education: N/A   Social History Main Topics  . Smoking status: Former Smoker    Types: Cigarettes    Quit date: 06/11/1999  . Smokeless tobacco: Never Used  . Alcohol use No  . Drug use: No  . Sexual activity: No   Other Topics Concern  . None   Social History Narrative  . None   Additional Social History:                         Sleep: Fair  Appetite:  Fair  Current Medications: Current Facility-Administered Medications  Medication Dose Route Frequency Provider Last Rate Last Dose  . acetaminophen (TYLENOL) tablet 650 mg  650 mg Oral Q6H PRN Clapacs, [name], MD      . alum & mag hydroxide-simeth (MAALOX/MYLANTA) 200-200-20 MG/5ML suspension 30 mL  30 mL Oral Q4H PRN Clapacs, [name], MD      . cyclobenzaprine (FLEXERIL) tablet 5 mg  5 mg Oral TID Teige Rountree B, MD   5 mg at 11/11/16 0900  . divalproex (DEPAKOTE) DR tablet 500 mg  500 mg Oral Q8H Hitoshi Werts B, MD   500 mg at 11/11/16 3299  . haloperidol (HALDOL) tablet 1 mg  1 mg Oral Q6H PRN Clapacs, [name], MD   1 mg at 11/09/16 1401  . haloperidol (HALDOL) tablet 5  mg  5 mg Oral BID Fenix Ruppe B, MD   5 mg at 11/11/16 0900  . hydrOXYzine (ATARAX/VISTARIL) tablet 25 mg  25 mg Oral TID PRN Lenward Chancellor, MD   25 mg at 11/09/16 2019  . ibuprofen (ADVIL,MOTRIN) tablet 600 mg  600 mg Oral Q6H PRN Clapacs, [name], MD   600 mg at 11/06/16 0959  . magnesium hydroxide (MILK OF MAGNESIA) suspension 30 mL  30 mL Oral Daily PRN Clapacs, [name], MD      . OLANZapine (ZYPREXA) injection 10 mg  10 mg Intramuscular BID PRN Alim Cattell B, MD      . OLANZapine zydis (ZYPREXA) disintegrating tablet 15 mg  15 mg Oral BID Issaac Shipper B, MD   15 mg at 11/11/16 0900  . traZODone (DESYREL) tablet 100 mg  100 mg Oral  QHS Clapacs, [name], MD   100 mg at 11/10/16 2109    Lab Results:  No results found for this or any previous visit (from the past 48 hour(s)).  Blood Alcohol level:  Lab Results  Component Value Date   ETH <5 11/03/2016   ETH <5 17/61/6073    Metabolic Disorder Labs: Lab Results  Component Value Date   HGBA1C 5.5 11/05/2016   MPG 111 11/05/2016   No results found for: PROLACTIN Lab Results  Component Value Date   CHOL 166 11/05/2016   TRIG 95 11/05/2016   HDL 32 (L) 11/05/2016   CHOLHDL 5.2 11/05/2016   VLDL 19 11/05/2016   LDLCALC 115 (H) 11/05/2016   LDLCALC 132 (H) 04/15/2015    Physical Findings: AIMS:  , ,  ,  ,    CIWA:    COWS:     Musculoskeletal: Strength & Muscle Tone: within normal limits Gait & Station: normal Patient leans: N/A  Psychiatric Specialty Exam: Physical Exam  Nursing note and vitals reviewed. Psychiatric: His affect is blunt. His speech is delayed. He is withdrawn and actively hallucinating. Thought content is paranoid and delusional. Cognition and memory are normal. He expresses impulsivity.    Review of Systems  Psychiatric/Behavioral: Positive for hallucinations.  All other systems reviewed and are negative.   Blood pressure 114/84, pulse (!) 104, temperature 98 F (36.7 C), temperature source Oral, resp. rate 18, height 5\' 6"  (1.676 m), weight 81.6 kg (180 lb), SpO2 100 %.Body mass index is 29.05 kg/m.  General Appearance: Casual  Eye Contact:  None  Speech:  Clear and Coherent  Volume:  Decreased  Mood:  Depressed  Affect:  Blunt  Thought Process:  Goal Directed and Descriptions of Associations: Intact  Orientation:  Full (Time, Place, and Person)  Thought Content:  Delusions, Hallucinations: Auditory and Paranoid Ideation  Suicidal Thoughts:  No  Homicidal Thoughts:  No  Memory:  Immediate;   Fair Recent;   Fair Remote;   Fair  Judgement:  Poor  Insight:  Lacking  Psychomotor Activity:  Psychomotor Retardation   Concentration:  Concentration: Fair and Attention Span: Fair  Recall:  [employer] of Knowledge:  Fair  Language:  Fair  Akathisia:  No  Handed:  Right  AIMS (if indicated):     Assets:  Communication Skills Desire for Improvement Financial Resources/Insurance Housing Physical Health Resilience  ADL's:  Intact  Cognition:  WNL  Sleep:  Number of Hours: 7.45     Treatment Plan Summary: Daily contact with patient to assess and evaluate symptoms and progress in treatment and Medication management   Mr. Rayl is  a 51 year old male with a history of schizophrenia admitted in a psychotic episode in the context of medication noncompliance  1. Psychosis. He was switched to Zyprexa zydis 15 mg twice daily for psychosis. Zyprexa injection is available for agitation. We added depakote for mood stabilizationand Haldol for unrellenting psychotic symptoms.     2. Anxiety. He has been off clonazepam for a while. I will not restart it. Hydroxyzine  Is available.   3. Insomnia. Trazodone is available.   4. Metabolic syndrome.Lipid panel, TSH and HgbA1C are normal.    5. EKG. normal sinus rhythm, QTC 428.  6. Back pain. We restarted Flexeril.   7. Disposition. He will likely be discharged back to his apartment. So far we were unable to identify any providers in the area who work with his insurance.   Orson Slick, MD 11/11/2016, 9:36 AM

## 2016-11-11 NOTE — Progress Notes (Signed)
First part of shift patient remained in room in bed except for meals and medication pass.  No group attendance.  No interaction noted with peers.  Patient noted talking to self while walking in the halls.  Denies SI/HI.  Support and encouragement offered.  Safety checks maintained.

## 2016-11-11 NOTE — B[facility] Group Notes (Signed)
[place] LCSW Group Therapy   11/11/2016 1:00 pm Type of Therapy: Group Therapy   Participation Level: Patient invited but did not attend.    Glorious Peach, MSW, LCSW-A 11/11/2016, 2:44PM

## 2016-11-11 NOTE — BHH Group Notes (Signed)
[place] Group Notes:  (Nursing/MHT/Case Management/Adjunct)  Date:  11/11/2016  Time:  11:23 PM  Type of Therapy:  Psychoeducational Skills  Participation Level:  Did Not Attend  Kathi Ludwig 11/11/2016, 11:23 PM

## 2016-11-12 NOTE — Progress Notes (Signed)
Recreation Therapy Notes  Date: 06.05.18 Time: 9:30 am Location: Craft Room  Group Topic: Coping Skills  Goal Area(s) Addresses:  Patient will write healthy coping skills. Patient will verbalize benefit of using healthy coping skills.  Behavioral Response: Did not attend  Intervention: Coping Skills Alphabet  Activity: Patients were given a Coping Skills Alphabet worksheet and were instructed to write healthy coping skills for each letter of the alphabet.  Education: LRT educated patients on healthy coping skills.  Education Outcome: Patient did not attend group.  Clinical Observations/Feedback: Patient did not attend group.  Leonette Monarch, LRT/CTRS 11/12/2016 10:16 AM

## 2016-11-12 NOTE — Plan of Care (Signed)
Problem: Activity: Goal: Interest or engagement in activities will improve Outcome: Not Progressing Patient encouraged to attend group therapy classes, patient verbalized that he would attend. Client did not attend the morning therapy class and was noted to be in bed resting with eyes closed.

## 2016-11-12 NOTE — Progress Notes (Signed)
[facility] MD Progress Note  11/12/2016 4:43 PM SHAUL TRAUTMAN  MRN:  [id number]  Subjective: Mr. Vahle is a 51 year old male with a history of schizophrenia admitted floridly psychotic, paranoid and hallucinating, in the context of medication noncompliance. He usually responds well to Zyprexa but has been more agitated, possibly cheeking medications. He refuses injectable antipsychotics. Lives independently.  11/11/2016. Mr. Hippler is asleep and hard to awake this morning. Last night I started Haldol in addition to Zyprexa as the patient continued to be very paranoid, delusional and hallucinating. He received a dose of Haldol on Saturday for agitation. The patient refuses injectable antipsychotics but has not been compliant with psychiatric care. We have difficilties finding him a local provider due to his insurance. We plan on giving him Haldol decanoate injection at discharge.  11/12/2016. Mr. Gucciardo was in bed again this morning difficult to arouse. He did get up for lunch. He is clearly attending to internal stimuli. He is complaint with medications. No program participation in spite of encouragement.  Per nursing: First part of shift patient remained in room in bed except for meals and medication pass.  No group attendance.  No interaction noted with peers.  Patient noted talking to self while walking in the halls.  Denies SI/HI.  Support and encouragement offered.  Safety checks maintained.  Principal Problem: Undifferentiated schizophrenia ([place]) Diagnosis:   Patient Active Problem List   Diagnosis Date Noted  . Undifferentiated schizophrenia ([place]) [F20.3] 11/04/2016  . Noncompliance [Z91.19] 04/14/2015   Total Time spent with patient: 30 minutes  Past Psychiatric History: schizophrenia.  Past Medical History:  Past Medical History:  Diagnosis Date  . Anxiety   . Generalized anxiety disorder   . Schizophrenia [facility])     Past Surgical History:  Procedure Laterality Date  . TONSILLECTOMY      Family History:  Family History  Problem Relation Age of Onset  . Family history unknown: Yes   Family Psychiatric  History: unknown. Social History:  History  Alcohol Use No     History  Drug Use No    Social History   Social History  . Marital status: Single    Spouse name: N/A  . Number of children: N/A  . Years of education: N/A   Social History Main Topics  . Smoking status: Former Smoker    Types: Cigarettes    Quit date: 06/11/1999  . Smokeless tobacco: Never Used  . Alcohol use No  . Drug use: No  . Sexual activity: No   Other Topics Concern  . None   Social History Narrative  . None   Additional Social History:                         Sleep: Fair  Appetite:  Fair  Current Medications: Current Facility-Administered Medications  Medication Dose Route Frequency Provider Last Rate Last Dose  . acetaminophen (TYLENOL) tablet 650 mg  650 mg Oral Q6H PRN Clapacs, [name], MD      . alum & mag hydroxide-simeth (MAALOX/MYLANTA) 200-200-20 MG/5ML suspension 30 mL  30 mL Oral Q4H PRN Clapacs, [name], MD      . cyclobenzaprine (FLEXERIL) tablet 5 mg  5 mg Oral TID Joseguadalupe Stan B, MD   5 mg at 11/12/16 1258  . divalproex (DEPAKOTE) DR tablet 500 mg  500 mg Oral Q8H Loreena Valeri B, MD   500 mg at 11/12/16 1302  . haloperidol (HALDOL) tablet 1 mg  1 mg Oral Q6H PRN Clapacs, [name], MD   1 mg at 11/09/16 1401  . haloperidol (HALDOL) tablet 5 mg  5 mg Oral BID Meshell Abdulaziz B, MD   5 mg at 11/12/16 0808  . hydrOXYzine (ATARAX/VISTARIL) tablet 25 mg  25 mg Oral TID PRN Lenward Chancellor, MD   25 mg at 11/09/16 2019  . ibuprofen (ADVIL,MOTRIN) tablet 600 mg  600 mg Oral Q6H PRN Clapacs, [name], MD   600 mg at 11/06/16 0959  . magnesium hydroxide (MILK OF MAGNESIA) suspension 30 mL  30 mL Oral Daily PRN Clapacs, [name], MD      . OLANZapine (ZYPREXA) injection 10 mg  10 mg Intramuscular BID PRN Ashlynne Shetterly B, MD      . OLANZapine  zydis (ZYPREXA) disintegrating tablet 15 mg  15 mg Oral BID Montrez Marietta B, MD   15 mg at 11/12/16 0807  . traZODone (DESYREL) tablet 100 mg  100 mg Oral QHS Clapacs, [name], MD   100 mg at 11/11/16 2229    Lab Results:  No results found for this or any previous visit (from the past 48 hour(s)).  Blood Alcohol level:  Lab Results  Component Value Date   ETH <5 11/03/2016   ETH <5 50/93/2671    Metabolic Disorder Labs: Lab Results  Component Value Date   HGBA1C 5.5 11/05/2016   MPG 111 11/05/2016   No results found for: PROLACTIN Lab Results  Component Value Date   CHOL 166 11/05/2016   TRIG 95 11/05/2016   HDL 32 (L) 11/05/2016   CHOLHDL 5.2 11/05/2016   VLDL 19 11/05/2016   LDLCALC 115 (H) 11/05/2016   LDLCALC 132 (H) 04/15/2015    Physical Findings: AIMS:  , ,  ,  ,    CIWA:    COWS:     Musculoskeletal: Strength & Muscle Tone: within normal limits Gait & Station: normal Patient leans: N/A  Psychiatric Specialty Exam: Physical Exam  Nursing note and vitals reviewed. Psychiatric: His affect is blunt. His speech is delayed. He is withdrawn and actively hallucinating. Thought content is paranoid and delusional. Cognition and memory are normal. He expresses impulsivity.    Review of Systems  Psychiatric/Behavioral: Positive for hallucinations.  All other systems reviewed and are negative.   Blood pressure 117/79, pulse 78, temperature 97.7 F (36.5 C), temperature source Oral, resp. rate 18, height 5\' 6"  (1.676 m), weight 81.6 kg (180 lb), SpO2 100 %.Body mass index is 29.05 kg/m.  General Appearance: Casual  Eye Contact:  None  Speech:  Clear and Coherent  Volume:  Decreased  Mood:  Depressed  Affect:  Blunt  Thought Process:  Goal Directed and Descriptions of Associations: Intact  Orientation:  Full (Time, Place, and Person)  Thought Content:  Delusions, Hallucinations: Auditory and Paranoid Ideation  Suicidal Thoughts:  No  Homicidal Thoughts:   No  Memory:  Immediate;   Fair Recent;   Fair Remote;   Fair  Judgement:  Poor  Insight:  Lacking  Psychomotor Activity:  Psychomotor Retardation  Concentration:  Concentration: Fair and Attention Span: Fair  Recall:  [employer] of Knowledge:  Fair  Language:  Fair  Akathisia:  No  Handed:  Right  AIMS (if indicated):     Assets:  Communication Skills Desire for Improvement Financial Resources/Insurance Housing Physical Health Resilience  ADL's:  Intact  Cognition:  WNL  Sleep:  Number of Hours: 7.45     Treatment Plan Summary:  Daily contact with patient to assess and evaluate symptoms and progress in treatment and Medication management   Mr. Tucker is a 51 year old male with a history of schizophrenia admitted in a psychotic episode in the context of medication noncompliance  1. Psychosis. He was switched to Zyprexa zydis 15 mg twice daily for psychosis. Zyprexa injection is available for agitation. We added depakote for mood stabilizationand Haldol for unrellenting psychotic symptoms.     2. Anxiety. He has been off clonazepam for a while. I will not restart it. Hydroxyzine  Is available.   3. Insomnia. Trazodone is available.   4. Metabolic syndrome.Lipid panel, TSH and HgbA1C are normal.    5. EKG. normal sinus rhythm, QTC 428.  6. Back pain. We restarted Flexeril.   7. Disposition. He will likely be discharged back to his apartment. So far we were unable to identify any providers in the area who work with his insurance.   Orson Slick, MD 11/12/2016, 4:43 PM

## 2016-11-12 NOTE — BHH Group Notes (Signed)
[place] LCSW Group Therapy  11/12/2016 3:51 PM  Type of Therapy:  Group Therapy  " Feelings around Diagnosis"   Participation Level:  Did Not Attend    Lilly Cove 11/12/2016, 3:51 PM

## 2016-11-13 LAB — AMMONIA: AMMONIA: 25 umol/L (ref 9–35)

## 2016-11-13 LAB — VALPROIC ACID LEVEL: VALPROIC ACID LVL: 93 ug/mL (ref 50.0–100.0)

## 2016-11-13 MED ORDER — HALOPERIDOL DECANOATE 100 MG/ML IM SOLN
100.0000 mg | INTRAMUSCULAR | Status: DC
  Administered 2016-11-13: 100 mg via INTRAMUSCULAR
  Filled 2016-11-13: qty 1

## 2016-11-13 MED ORDER — HALOPERIDOL LACTATE 2 MG/ML PO CONC
5.0000 mg | Freq: Two times a day (BID) | ORAL | Status: DC
  Administered 2016-11-13 – 2016-11-16 (×7): 5 mg via ORAL
  Filled 2016-11-13 (×9): qty 2.5

## 2016-11-13 NOTE — B[facility] Group Notes (Signed)
[place] Group Notes:  (Nursing/MHT/Case Management/Adjunct)  Date:  11/13/2016  Time:  11:38 PM  Type of Therapy:  Group Therapy  Participation Level:  Active  Participation Quality:  Appropriate  Affect:  Appropriate  Cognitive:  Appropriate  Insight:  Appropriate  Engagement in Group:  Engaged  Modes of Intervention:  Discussion  Summary of Progress/Problems:  Kandis Fantasia 11/13/2016, 11:38 PM

## 2016-11-13 NOTE — Progress Notes (Signed)
[facility] MD Progress Note  11/13/2016 4:39 PM Benjamin Braun  MRN:  [id number]  Subjective: Benjamin Braun is a 51 year old male with a history of schizophrenia admitted floridly psychotic, paranoid and hallucinating, in the context of medication noncompliance. He usually responds well to Zyprexa but has been more agitated, possibly cheeking medications. He refuses injectable antipsychotics. Lives independently.  11/11/2016. Benjamin Braun is asleep and hard to awake this morning. Last night I started Haldol in addition to Zyprexa as the patient continued to be very paranoid, delusional and hallucinating. He received a dose of Haldol on Saturday for agitation. The patient refuses injectable antipsychotics but has not been compliant with psychiatric care. We have difficilties finding him a local provider due to his insurance. We plan on giving him Haldol decanoate injection at discharge.  11/12/2016. Benjamin Braun was in bed again this morning difficult to arouse. He did get up for lunch. He is clearly attending to internal stimuli. He is complaint with medications. No program participation in spite of encouragement.  11/13/2016. Benjamin Braun is awake and aware today. He presents much better he is no longer paranoid and never talks about his neighbors or crips. He does admit that medication makes him feel better. His Depakote level is 93 indicating good adherence. He even agreed to take Haldol Decanoate injection. It remains to be seen if he complies as injections are against his religion. He reports no side effects. For the first time today he started worrying about paying his rent, feeding his fish and getting his dog back from a friend's house.  Per nursing: D: Pt denies SI/HI/AVH, but noted responding to internal stimuli. Pt is irritable, thoughts are disorganized, and he was sometimes noted having confusion to place and situation. Pt is not interacting with peers and staff appropriately.  A: Pt was offered support and  encouragement. Pt was given scheduled medications. Pt was encouraged to attend groups. Q 15 minute checks were done for safety.  R:Pt did not attend evening group. Pt is taking medication. Patient is not  receptive to treatment, safety maintained on unit.   Principal Problem: Undifferentiated schizophrenia ([place]) Diagnosis:   Patient Active Problem List   Diagnosis Date Noted  . Undifferentiated schizophrenia ([place]) [F20.3] 11/04/2016  . Noncompliance [Z91.19] 04/14/2015   Total Time spent with patient: 30 minutes  Past Psychiatric History: schizophrenia.  Past Medical History:  Past Medical History:  Diagnosis Date  . Anxiety   . Generalized anxiety disorder   . Schizophrenia [facility])     Past Surgical History:  Procedure Laterality Date  . TONSILLECTOMY     Family History:  Family History  Problem Relation Age of Onset  . Family history unknown: Yes   Family Psychiatric  History: unknown. Social History:  History  Alcohol Use No     History  Drug Use No    Social History   Social History  . Marital status: Single    Spouse name: N/A  . Number of children: N/A  . Years of education: N/A   Social History Main Topics  . Smoking status: Former Smoker    Types: Cigarettes    Quit date: 06/11/1999  . Smokeless tobacco: Never Used  . Alcohol use No  . Drug use: No  . Sexual activity: No   Other Topics Concern  . None   Social History Narrative  . None   Additional Social History:  Sleep: Fair  Appetite:  Fair  Current Medications: Current Facility-Administered Medications  Medication Dose Route Frequency Provider Last Rate Last Dose  . acetaminophen (TYLENOL) tablet 650 mg  650 mg Oral Q6H PRN Clapacs, [name], MD      . alum & mag hydroxide-simeth (MAALOX/MYLANTA) 200-200-20 MG/5ML suspension 30 mL  30 mL Oral Q4H PRN Clapacs, [name], MD      . cyclobenzaprine (FLEXERIL) tablet 5 mg  5 mg Oral TID Adeel Guiffre B, MD    5 mg at 11/13/16 1621  . haloperidol (HALDOL) 2 MG/ML solution 5 mg  5 mg Oral BID Howard Bunte B, MD   5 mg at 11/13/16 1633  . haloperidol decanoate (HALDOL DECANOATE) 100 MG/ML injection 100 mg  100 mg Intramuscular Q30 days Hassen Bruun B, MD      . hydrOXYzine (ATARAX/VISTARIL) tablet 25 mg  25 mg Oral TID PRN Lenward Chancellor, MD   25 mg at 11/09/16 2019  . ibuprofen (ADVIL,MOTRIN) tablet 600 mg  600 mg Oral Q6H PRN Clapacs, [name], MD   600 mg at 11/06/16 0959  . magnesium hydroxide (MILK OF MAGNESIA) suspension 30 mL  30 mL Oral Daily PRN Clapacs, [name], MD      . OLANZapine (ZYPREXA) injection 10 mg  10 mg Intramuscular BID PRN Anetta Olvera B, MD      . OLANZapine zydis (ZYPREXA) disintegrating tablet 15 mg  15 mg Oral BID Isidoro Santillana B, MD   15 mg at 11/13/16 1622  . traZODone (DESYREL) tablet 100 mg  100 mg Oral QHS Clapacs, [name], MD   100 mg at 11/12/16 2140    Lab Results:  Results for orders placed or performed during the hospital encounter of 11/04/16 (from the past 48 hour(s))  Valproic acid level     Status: None   Collection Time: 11/13/16  7:02 AM  Result Value Ref Range   Valproic Acid Lvl 93 50.0 - 100.0 ug/mL  Ammonia     Status: None   Collection Time: 11/13/16  7:02 AM  Result Value Ref Range   Ammonia 25 9 - 35 umol/L    Blood Alcohol level:  Lab Results  Component Value Date   ETH <5 11/03/2016   ETH <5 63/78/5885    Metabolic Disorder Labs: Lab Results  Component Value Date   HGBA1C 5.5 11/05/2016   MPG 111 11/05/2016   No results found for: PROLACTIN Lab Results  Component Value Date   CHOL 166 11/05/2016   TRIG 95 11/05/2016   HDL 32 (L) 11/05/2016   CHOLHDL 5.2 11/05/2016   VLDL 19 11/05/2016   LDLCALC 115 (H) 11/05/2016   LDLCALC 132 (H) 04/15/2015    Physical Findings: AIMS:  , ,  ,  ,    CIWA:    COWS:     Musculoskeletal: Strength & Muscle Tone: within normal limits Gait & Station:  normal Patient leans: N/A  Psychiatric Specialty Exam: Physical Exam  Nursing note and vitals reviewed. Psychiatric: His affect is blunt. His speech is delayed. He is withdrawn and actively hallucinating. Thought content is paranoid and delusional. Cognition and memory are normal. He expresses impulsivity.    Review of Systems  Psychiatric/Behavioral: Positive for hallucinations.  All other systems reviewed and are negative.   Blood pressure 133/86, pulse 83, temperature 98.4 F (36.9 C), temperature source Oral, resp. rate 18, height 5\' 6"  (1.676 m), weight 81.6 kg (180 lb), SpO2 100 %.Body mass index is 29.05  kg/m.  General Appearance: Casual  Eye Contact:  None  Speech:  Clear and Coherent  Volume:  Decreased  Mood:  Depressed  Affect:  Blunt  Thought Process:  Goal Directed and Descriptions of Associations: Intact  Orientation:  Full (Time, Place, and Person)  Thought Content:  Delusions, Hallucinations: Auditory and Paranoid Ideation  Suicidal Thoughts:  No  Homicidal Thoughts:  No  Memory:  Immediate;   Fair Recent;   Fair Remote;   Fair  Judgement:  Poor  Insight:  Lacking  Psychomotor Activity:  Psychomotor Retardation  Concentration:  Concentration: Fair and Attention Span: Fair  Recall:  [employer] of Knowledge:  Fair  Language:  Fair  Akathisia:  No  Handed:  Right  AIMS (if indicated):     Assets:  Communication Skills Desire for Improvement Financial Resources/Insurance Housing Physical Health Resilience  ADL's:  Intact  Cognition:  WNL  Sleep:  Number of Hours: 6.15     Treatment Plan Summary: Daily contact with patient to assess and evaluate symptoms and progress in treatment and Medication management   Mr. Rackley is a 51 year old male with a history of schizophrenia admitted in a psychotic episode in the context of medication noncompliance  1. Psychosis. He was switched to Zyprexa zydis 15 mg twice daily for psychosis. Zyprexa injection is  available for agitation. We added depakote for mood stabilizationand Haldol for unrellenting psychotic symptoms. He agreed to take Haldol shot. VPA level 93.   2. Anxiety. He has been off clonazepam for a while. I will not restart it. Hydroxyzine  Is available.   3. Insomnia. Trazodone is available.   4. Metabolic syndrome.Lipid panel, TSH and HgbA1C are normal.    5. EKG. normal sinus rhythm, QTC 428.  6. Back pain. We restarted Flexeril.   7. Disposition. He will likely be discharged back to his apartment. So far we were unable to identify any providers in the area who work with his insurance.   Orson Slick, MD 11/13/2016, 4:39 PM

## 2016-11-13 NOTE — Progress Notes (Signed)
D: Pt denies SI/HI/AVH, but noted responding to internal stimuli. Pt is irritable, thoughts are disorganized, and he was sometimes noted having confusion to place and situation. Pt is not interacting with peers and staff appropriately.  A: Pt was offered support and encouragement. Pt was given scheduled medications. Pt was encouraged to attend groups. Q 15 minute checks were done for safety.  R:Pt did not attend evening group. Pt is taking medication. Patient is not  receptive to treatment, safety maintained on unit.

## 2016-11-13 NOTE — Plan of Care (Signed)
Problem: Coping: Goal: Ability to verbalize frustrations and anger appropriately will improve Outcome: Not Progressing Patient couldn't verbalize feelings or anger with staff.

## 2016-11-13 NOTE — Progress Notes (Signed)
Patient denies SI/HI AVH but noted to be responding to internal stimuli. Patient is cooperative but can get irritable intermittently. Encouraged to attend group classes, noted per recreation therapist that he did not attend. Up for meals in the community room, appetite is fair adequate po fluid intake noted.Safety maintained on the unit.

## 2016-11-13 NOTE — Progress Notes (Signed)
Recreation Therapy Notes  Date: 06.06.18 Time: 9:30 am Location: Craft Room  Group Topic: Self-esteem  Goal Area(s) Addresses: Patient will write at least one positive trait about self. Patient will verbalize benefit of having a healthy self-esteem.   Behavioral Response: Did not attend  Intervention: I Am   Activity: Patients were given a worksheet with the letter I on it and were instructed to write as many positive traits about themselves inside the letter.  Education: LRT educated patients on ways to increase their self-esteem.   Education Outcome: Patient did not attend group.  Clinical Observations/Feedback: Patient did not attend group.  Leonette Monarch, LRT/CTRS 11/13/2016 9:58 AM

## 2016-11-13 NOTE — Tx Team (Signed)
Interdisciplinary Treatment and Diagnostic Plan Update  11/13/2016 Time of Session: 1105 AM Benjamin Braun MRN: [id number]  Principal Diagnosis: Undifferentiated schizophrenia [facility])  Secondary Diagnoses: Principal Problem:   Undifferentiated schizophrenia ([place])   Current Medications:  Current Facility-Administered Medications  Medication Dose Route Frequency Provider Last Rate Last Dose  . acetaminophen (TYLENOL) tablet 650 mg  650 mg Oral Q6H PRN Clapacs, [name], MD      . alum & mag hydroxide-simeth (MAALOX/MYLANTA) 200-200-20 MG/5ML suspension 30 mL  30 mL Oral Q4H PRN Clapacs, [name], MD      . cyclobenzaprine (FLEXERIL) tablet 5 mg  5 mg Oral TID Pucilowska, Jolanta B, MD   5 mg at 11/13/16 1153  . haloperidol (HALDOL) 2 MG/ML solution 5 mg  5 mg Oral BID Pucilowska, Jolanta B, MD   5 mg at 11/13/16 0816  . hydrOXYzine (ATARAX/VISTARIL) tablet 25 mg  25 mg Oral TID PRN Lenward Chancellor, MD   25 mg at 11/09/16 2019  . ibuprofen (ADVIL,MOTRIN) tablet 600 mg  600 mg Oral Q6H PRN Clapacs, [name], MD   600 mg at 11/06/16 0959  . magnesium hydroxide (MILK OF MAGNESIA) suspension 30 mL  30 mL Oral Daily PRN Clapacs, [name], MD      . OLANZapine (ZYPREXA) injection 10 mg  10 mg Intramuscular BID PRN Pucilowska, Jolanta B, MD      . OLANZapine zydis (ZYPREXA) disintegrating tablet 15 mg  15 mg Oral BID Pucilowska, Jolanta B, MD   15 mg at 11/13/16 0800  . traZODone (DESYREL) tablet 100 mg  100 mg Oral QHS Clapacs, [name], MD   100 mg at 11/12/16 2140   PTA Medications: Prescriptions Prior to Admission  Medication Sig Dispense Refill Last Dose  . clonazePAM (KLONOPIN) 1 MG tablet Take 1 tablet (1 mg total) by mouth 2 (two) times daily. 60 tablet 3 Taking  . cyclobenzaprine (FLEXERIL) 10 MG tablet Take 10 mg by mouth every 8 (eight) hours as needed (for back/neck pain).   Taking  . hydrOXYzine (ATARAX/VISTARIL) 50 MG tablet Take 1 tablet (50 mg total) by mouth at bedtime. 30 tablet 2   .  OLANZapine (ZYPREXA) 10 MG tablet Take 1 tablet (10 mg total) by mouth 2 (two) times daily. 60 tablet 2   . traZODone (DESYREL) 100 MG tablet Take 1 tablet (100 mg total) by mouth at bedtime. 30 tablet 2     Patient Stressors: Financial difficulties Health problems Medication change or noncompliance  Patient Strengths: Average or above average intelligence Capable of independent living  Treatment Modalities: Medication Management, Group therapy, Case management,  1 to 1 session with clinician, Psychoeducation, Recreational therapy.   Physician Treatment Plan for Primary Diagnosis: Undifferentiated schizophrenia ([place]) Long Term Goal(s): Improvement in symptoms so as ready for discharge NA   Short Term Goals: Ability to identify changes in lifestyle to reduce recurrence of condition will improve Ability to verbalize feelings will improve Ability to disclose and discuss suicidal ideas Ability to demonstrate self-control will improve Ability to identify and develop effective coping behaviors will improve Compliance with prescribed medications will improve Ability to identify triggers associated with substance abuse/mental health issues will improve NA  Medication Management: Evaluate patient's response, side effects, and tolerance of medication regimen.  Therapeutic Interventions: 1 to 1 sessions, Unit Group sessions and Medication administration.  Evaluation of Outcomes: Progressing  Physician Treatment Plan for Secondary Diagnosis: Principal Problem:   Undifferentiated schizophrenia ([place])  Long Term Goal(s): Improvement in symptoms  so as ready for discharge NA   Short Term Goals: Ability to identify changes in lifestyle to reduce recurrence of condition will improve Ability to verbalize feelings will improve Ability to disclose and discuss suicidal ideas Ability to demonstrate self-control will improve Ability to identify and develop effective coping behaviors will  improve Compliance with prescribed medications will improve Ability to identify triggers associated with substance abuse/mental health issues will improve NA     Medication Management: Evaluate patient's response, side effects, and tolerance of medication regimen.  Therapeutic Interventions: 1 to 1 sessions, Unit Group sessions and Medication administration.  Evaluation of Outcomes: Progressing   RN Treatment Plan for Primary Diagnosis: Undifferentiated schizophrenia ([place]) Long Term Goal(s): Knowledge of disease and therapeutic regimen to maintain health will improve  Short Term Goals: Ability to demonstrate self-control, Ability to verbalize feelings will improve and Compliance with prescribed medications will improve  Medication Management: RN will administer medications as ordered by provider, will assess and evaluate patient's response and provide education to patient for prescribed medication. RN will report any adverse and/or side effects to prescribing provider.  Therapeutic Interventions: 1 on 1 counseling sessions, Psychoeducation, Medication administration, Evaluate responses to treatment, Monitor vital signs and CBGs as ordered, Perform/monitor CIWA, COWS, AIMS and Fall Risk screenings as ordered, Perform wound care treatments as ordered.  Evaluation of Outcomes: Progressing   LCSW Treatment Plan for Primary Diagnosis: Undifferentiated schizophrenia ([place]) Long Term Goal(s): Safe transition to appropriate next level of care at discharge, Engage patient in therapeutic group addressing interpersonal concerns.  Short Term Goals: Engage patient in aftercare planning with referrals and resources, Increase social support and Increase skills for wellness and recovery  Therapeutic Interventions: Assess for all discharge needs, 1 to 1 time with Social worker, Explore available resources and support systems, Assess for adequacy in community support network, Educate family and significant  other(s) on suicide prevention, Complete Psychosocial Assessment, Interpersonal group therapy.  Evaluation of Outcomes: Progressing   Progress in Treatment: Attending groups: Yes. Participating in groups: Yes. Taking medication as prescribed: Yes. Toleration medication: Yes. Family/Significant other contact made: No, will contact:  CSW assessing proper contacts. Patient understands diagnosis: Yes. Discussing patient identified problems/goals with staff: Yes. Medical problems stabilized or resolved: Yes. Denies suicidal/homicidal ideation: Yes. Issues/concerns per patient self-inventory: No.  New problem(s) identified: No, Describe:  None identified.  New Short Term/Long Term Goal(s):  Discharge Plan or Barriers: CSW assessing for appropriate plan.  Reason for Continuation of Hospitalization: Delusions  Hallucinations Medication stabilization  Estimated Length of Stay: 7 days  Attendees: Patient: 11/13/2016   Physician: [name], MD 11/13/2016   Nursing: Elige Radon, RN 11/13/2016   RN Care Manager: 11/13/2016   Social Worker: Lurline Idol, LCSW 11/13/2016   Recreational Therapist: Everitt Amber, LRT/CTRS  11/13/2016   Other:  11/13/2016   Other:  11/13/2016   Other: 11/13/2016         Scribe for Treatment Team: Joanne Chars, [place] 11/13/2016 12:51 PM

## 2016-11-13 NOTE — Plan of Care (Signed)
Problem: Role Relationship: Goal: Ability to communicate needs accurately will improve Outcome: Progressing Client able to express needs without difficulty

## 2016-11-14 NOTE — BHH Group Notes (Signed)
[place] LCSW Group Therapy  11/14/2016 2:16 PM  Type of Therapy:  Group Therapy  Participation Level:  Patient did not attend group. CSW invited patient to group.   Summary of Progress/Problems: Balance in life: Patients will discuss the concept of balance and how it looks and feels to be unbalanced. Pt will identify areas in their life that is unbalanced and ways to become more balanced. They discussed what aspects in their lives has influenced their self care. Patients also discussed self care in the areas of self regulation/control, hygiene/appearance, sleep/relaxation, healthy leisure, healthy eating habits, exercise, inner peace/spirituality, self improvement, sobriety, and health management. They were challenged to identify changes that are needed in order to improve self care.  Benjamin Braun G. [place], [place] 11/14/2016, 2:16 PM

## 2016-11-14 NOTE — BHH Group Notes (Signed)
  [place] LCSW Group Therapy Note  Date/Time: 11/13/16, 1300 late entry note  Type of Therapy/Topic:  Group Therapy:  Emotion Regulation  Participation Level:  Minimal   Mood: preoccupied  Description of Group:    The purpose of this group is to assist patients in learning to regulate negative emotions and experience positive emotions. Patients will be guided to discuss ways in which they have been vulnerable to their negative emotions. These vulnerabilities will be juxtaposed with experiences of positive emotions or situations, and patients challenged to use positive emotions to combat negative ones. Special emphasis will be placed on coping with negative emotions in conflict situations, and patients will process healthy conflict resolution skills.  Therapeutic Goals: 1. Patient will identify two positive emotions or experiences to reflect on in order to balance out negative emotions:  2. Patient will label two or more emotions that they find the most difficult to experience:  3. Patient will be able to demonstrate positive conflict resolution skills through discussion or role plays:   Summary of Patient Progress: This was first group pt has attended with this CSW.  Pt was somewhat inappropriate and often spoke out while others were talking saying things unrelated to the discussion.  Pt did not appear engaged in the topic and was not able to formulate appropriate response to CSW questions either.       Therapeutic Modalities:   Cognitive Behavioral Therapy Feelings Identification Dialectical Behavioral Therapy  Lurline Idol, LCSW

## 2016-11-14 NOTE — Progress Notes (Signed)
D: Pt was confused and delusional; Pt at the time of assessment anxiety, depression, pain, SI, HI or AVH; "I will be going home tomorrow and I'm fine; people have been trying to give me the wrong medications and I can tell."  A: Medications offered as prescribed. All patient's questions and concerns addressed. Support, encouragement, and safe environment provided. Will continue to monitor for any form of change. 15-minute safety checks continue. R: Pt was med compliant. Pt did not attend wrap-up group. Safety checks continue.

## 2016-11-14 NOTE — BHH Group Notes (Signed)
[place] LCSW Group Therapy Note  Type of Therapy and Topic:  Group Therapy:  Goals Group: SMART Goals  Participation Level:  Patient did not attend group. CSW invited patient to group.   Description of Group:   The purpose of a daily goals group is to assist and guide patients in setting recovery/wellness-related goals.  The objective is to set goals as they relate to the crisis in which they were admitted. Patients will be using SMART goal modalities to set measurable goals.  Characteristics of realistic goals will be discussed and patients will be assisted in setting and processing how one will reach their goal. Facilitator will also assist patients in applying interventions and coping skills learned in psycho-education groups to the SMART goal and process how one will achieve defined goal.  Therapeutic Goals: -Patients will develop and document one goal related to or their crisis in which brought them into treatment. -Patients will be guided by LCSW using SMART goal setting modality in how to set a measurable, attainable, realistic and time sensitive goal.  -Patients will process barriers in reaching goal. -Patients will process interventions in how to overcome and successful in reaching goal.   Summary of Patient Progress:  Patient Goal: Patient did not attend group. CSW invited patient to group.   Therapeutic Modalities:   Motivational Interviewing Public relations account executive Therapy Crisis Intervention Model SMART goals setting  Benjamin Braun G. [place], [facility] 11/14/2016 10:57 AM

## 2016-11-14 NOTE — Progress Notes (Signed)
[facility] MD Progress Note  11/14/2016 10:51 AM Benjamin Braun  MRN:  [id number]  Subjective: Benjamin Braun is a 51 year old male with a history of schizophrenia admitted floridly psychotic, paranoid and hallucinating, in the context of medication noncompliance. He usually responds well to Zyprexa but has been more agitated, possibly cheeking medications. He refuses injectable antipsychotics. Lives independently.  11/11/2016. Benjamin Braun is asleep and hard to awake this morning. Last night I started Haldol in addition to Zyprexa as the patient continued to be very paranoid, delusional and hallucinating. He received a dose of Haldol on Saturday for agitation. The patient refuses injectable antipsychotics but has not been compliant with psychiatric care. We have difficilties finding him a local provider due to his insurance. We plan on giving him Haldol decanoate injection at discharge.  11/12/2016. Benjamin Braun was in bed again this morning difficult to arouse. He did get up for lunch. He is clearly attending to internal stimuli. He is complaint with medications. No program participation in spite of encouragement.  11/13/2016. Benjamin Braun is awake and aware today. He presents much better he is no longer paranoid and never talks about his neighbors or crips. He does admit that medication makes him feel better. His Depakote level is 93 indicating good adherence. He even agreed to take Haldol Decanoate injection. It remains to be seen if he complies as injections are against his religion. He reports no side effects. For the first time today he started worrying about paying his rent, feeding his fish and getting his dog back from a friend's house.  11/14/2016. Benjamin Braun denies any symptoms of depression, anxiety or psychosis. He is still hallucinating talking to his voices loudly and paranoid about medications. He accepted haldol decanoate injection on 11/13/2016. He is asking for discharge to pay his bills and take care of  his dog and fish.   Per nursing: D: Pt was confused and delusional; Pt at the time of assessment anxiety, depression, pain, SI, HI or AVH; "I will be going home tomorrow and I'm fine; people have been trying to give me the wrong medications and I can tell."  A: Medications offered as prescribed. All patient's questions and concerns addressed. Support, encouragement, and safe environment provided. Will continue to monitor for any form of change. 15-minute safety checks continue. R: Pt was med compliant. Pt did not attend wrap-up group. Safety checks continue.   Principal Problem: Undifferentiated schizophrenia ([place]) Diagnosis:   Patient Active Problem List   Diagnosis Date Noted  . Undifferentiated schizophrenia ([place]) [F20.3] 11/04/2016  . Noncompliance [Z91.19] 04/14/2015   Total Time spent with patient: 30 minutes  Past Psychiatric History: schizophrenia.  Past Medical History:  Past Medical History:  Diagnosis Date  . Anxiety   . Generalized anxiety disorder   . Schizophrenia [facility])     Past Surgical History:  Procedure Laterality Date  . TONSILLECTOMY     Family History:  Family History  Problem Relation Age of Onset  . Family history unknown: Yes   Family Psychiatric  History: unknown. Social History:  History  Alcohol Use No     History  Drug Use No    Social History   Social History  . Marital status: Single    Spouse name: N/A  . Number of children: N/A  . Years of education: N/A   Social History Main Topics  . Smoking status: Former Smoker    Types: Cigarettes    Quit date: 06/11/1999  . Smokeless tobacco: Never Used  .  Alcohol use No  . Drug use: No  . Sexual activity: No   Other Topics Concern  . None   Social History Narrative  . None   Additional Social History:                         Sleep: Fair  Appetite:  Fair  Current Medications: Current Facility-Administered Medications  Medication Dose Route Frequency Provider Last  Rate Last Dose  . acetaminophen (TYLENOL) tablet 650 mg  650 mg Oral Q6H PRN Clapacs, [name], MD      . alum & mag hydroxide-simeth (MAALOX/MYLANTA) 200-200-20 MG/5ML suspension 30 mL  30 mL Oral Q4H PRN Clapacs, [name], MD      . cyclobenzaprine (FLEXERIL) tablet 5 mg  5 mg Oral TID Pucilowska, Jolanta B, MD   5 mg at 11/14/16 0823  . haloperidol (HALDOL) 2 MG/ML solution 5 mg  5 mg Oral BID Pucilowska, Jolanta B, MD   5 mg at 11/14/16 0915  . haloperidol decanoate (HALDOL DECANOATE) 100 MG/ML injection 100 mg  100 mg Intramuscular Q30 days Pucilowska, Jolanta B, MD   100 mg at 11/13/16 1706  . hydrOXYzine (ATARAX/VISTARIL) tablet 25 mg  25 mg Oral TID PRN Lenward Chancellor, MD   25 mg at 11/13/16 2205  . ibuprofen (ADVIL,MOTRIN) tablet 600 mg  600 mg Oral Q6H PRN Clapacs, [name], MD   600 mg at 11/06/16 0959  . magnesium hydroxide (MILK OF MAGNESIA) suspension 30 mL  30 mL Oral Daily PRN Clapacs, [name], MD      . OLANZapine (ZYPREXA) injection 10 mg  10 mg Intramuscular BID PRN Pucilowska, Jolanta B, MD      . OLANZapine zydis (ZYPREXA) disintegrating tablet 15 mg  15 mg Oral BID Pucilowska, Jolanta B, MD   15 mg at 11/14/16 7829  . traZODone (DESYREL) tablet 100 mg  100 mg Oral QHS Clapacs, [name], MD   100 mg at 11/13/16 2200    Lab Results:  Results for orders placed or performed during the hospital encounter of 11/04/16 (from the past 48 hour(s))  Valproic acid level     Status: None   Collection Time: 11/13/16  7:02 AM  Result Value Ref Range   Valproic Acid Lvl 93 50.0 - 100.0 ug/mL  Ammonia     Status: None   Collection Time: 11/13/16  7:02 AM  Result Value Ref Range   Ammonia 25 9 - 35 umol/L    Blood Alcohol level:  Lab Results  Component Value Date   ETH <5 11/03/2016   ETH <5 56/21/3086    Metabolic Disorder Labs: Lab Results  Component Value Date   HGBA1C 5.5 11/05/2016   MPG 111 11/05/2016   No results found for: PROLACTIN Lab Results  Component Value Date    CHOL 166 11/05/2016   TRIG 95 11/05/2016   HDL 32 (L) 11/05/2016   CHOLHDL 5.2 11/05/2016   VLDL 19 11/05/2016   LDLCALC 115 (H) 11/05/2016   LDLCALC 132 (H) 04/15/2015    Physical Findings: AIMS:  , ,  ,  ,    CIWA:    COWS:     Musculoskeletal: Strength & Muscle Tone: within normal limits Gait & Station: normal Patient leans: N/A  Psychiatric Specialty Exam: Physical Exam  Nursing note and vitals reviewed. Psychiatric: His affect is blunt. His speech is delayed. He is withdrawn and actively hallucinating. Thought content is paranoid and delusional. Cognition  and memory are normal. He expresses impulsivity.    Review of Systems  Psychiatric/Behavioral: Positive for hallucinations.  All other systems reviewed and are negative.   Blood pressure 116/87, pulse (!) 116, temperature 97.8 F (36.6 C), temperature source Oral, resp. rate 18, height 5\' 6"  (1.676 m), weight 81.6 kg (180 lb), SpO2 100 %.Body mass index is 29.05 kg/m.  General Appearance: Casual  Eye Contact:  None  Speech:  Clear and Coherent  Volume:  Decreased  Mood:  Depressed  Affect:  Blunt  Thought Process:  Goal Directed and Descriptions of Associations: Intact  Orientation:  Full (Time, Place, and Person)  Thought Content:  Delusions, Hallucinations: Auditory and Paranoid Ideation  Suicidal Thoughts:  No  Homicidal Thoughts:  No  Memory:  Immediate;   Fair Recent;   Fair Remote;   Fair  Judgement:  Poor  Insight:  Lacking  Psychomotor Activity:  Psychomotor Retardation  Concentration:  Concentration: Fair and Attention Span: Fair  Recall:  [employer] of Knowledge:  Fair  Language:  Fair  Akathisia:  No  Handed:  Right  AIMS (if indicated):     Assets:  Communication Skills Desire for Improvement Financial Resources/Insurance Housing Physical Health Resilience  ADL's:  Intact  Cognition:  WNL  Sleep:  Number of Hours: 5     Treatment Plan Summary: Daily contact with patient to assess  and evaluate symptoms and progress in treatment and Medication management   Benjamin Braun is a 51 year old male with a history of schizophrenia admitted in a psychotic episode in the context of medication noncompliance  1. Psychosis. He was switched to Zyprexa zydis 15 mg twice daily for psychosis. Zyprexa injection is available for agitation. We added depakote for mood stabilizationand Haldol for unrellenting psychotic symptoms. He agreed to take Haldol shot. VPA level 93.   2. Anxiety. He has been off clonazepam for a while. I will not restart it. Hydroxyzine  Is available.   3. Insomnia. Trazodone is available.   4. Metabolic syndrome.Lipid panel, TSH and HgbA1C are normal.    5. EKG. normal sinus rhythm, QTC 428.  6. Back pain. We restarted Flexeril.   7. Disposition. He will likely be discharged back to his apartment. So far we were unable to identify any providers in the area who work with his insurance.   Orson Slick, MD 11/14/2016, 10:51 AM

## 2016-11-14 NOTE — Progress Notes (Signed)
Pt is seen in room at times responding to internal stimuli. He is complaint with po zyprexa zydis and haldol. No aggressive behaviors noted. Will continue to monitor.

## 2016-11-14 NOTE — Plan of Care (Signed)
Problem: Safety: Goal: Ability to remain free from injury will improve Outcome: Progressing Pt is free from injury during hospital stay.

## 2016-11-14 NOTE — Progress Notes (Signed)
Recreation Therapy Notes  Date: 06.07.18 Time: 9:30 am Location: Craft Room  Group Topic: Leisure Education  Goal Area(s) Addresses:  Patient will identify things they are grateful for. Patient will identify how being grateful can influence decision making.  Behavioral Response: Intermittently Attentive  Intervention: Grateful Wheel  Activity: Patients were given an I Am Grateful For worksheet and were instructed to write things they are grateful for under each category.   Education: LRT educated patients on leisure.  Education Outcome: In group clarification offered   Clinical Observations/Feedback: Patient colored worksheet. Patient lightly beat his chest and back. Patient then read his Bible. Patient stated he was thankful for God and started talking about Scripture.   Leonette Monarch, LRT/CTRS 11/14/2016 10:13 AM

## 2016-11-14 NOTE — Progress Notes (Signed)
CSW spoke with pt's brother, Louie Casa [phone].  He has spoken with pt's landlord who is aware that pt is in hospital and that the rent will be late and is OK for now.  Louie Casa spoke with pt a few days ago--pt still asking seems paranoid: asked Louie Casa to pull a fuse on the [facility] in his apartment so that no one can use his [facility] while he is gone.  Louie Casa will try to come by and see him this weekend. Winferd Humphrey, MSW, LCSW Clinical Social Worker 11/14/2016 9:24 AM

## 2016-11-15 MED ORDER — HALOPERIDOL DECANOATE 100 MG/ML IM SOLN: 100.0000 mg | INTRAMUSCULAR | 1 refills | Status: DC

## 2016-11-15 MED ORDER — HYDROXYZINE HCL 50 MG PO TABS: 50.0000 mg | ORAL_TABLET | Freq: Every day | ORAL | 1 refills | Status: DC

## 2016-11-15 MED ORDER — CYCLOBENZAPRINE HCL 10 MG PO TABS: 10.0000 mg | ORAL_TABLET | Freq: Three times a day (TID) | ORAL | 1 refills | Status: DC | PRN

## 2016-11-15 MED ORDER — OLANZAPINE 5 MG PO TBDP
15.0000 mg | ORAL_TABLET | Freq: Two times a day (BID) | ORAL | Status: AC
  Administered 2016-11-16: 15 mg via ORAL
  Filled 2016-11-15: qty 3

## 2016-11-15 MED ORDER — TRAZODONE HCL 100 MG PO TABS: 100.0000 mg | ORAL_TABLET | Freq: Every day | ORAL | 1 refills | Status: DC

## 2016-11-15 MED ORDER — OLANZAPINE 15 MG PO TABS: 15.0000 mg | ORAL_TABLET | Freq: Every day | ORAL | 1 refills | Status: DC

## 2016-11-15 MED ORDER — OLANZAPINE 5 MG PO TABS
15.0000 mg | ORAL_TABLET | Freq: Every day | ORAL | Status: DC
  Administered 2016-11-15: 22:00:00 15 mg via ORAL
  Filled 2016-11-15: qty 1

## 2016-11-15 NOTE — Progress Notes (Signed)
APS report made to [place], intake worker [employer]. Winferd Humphrey, MSW, LCSW Clinical Social Worker 11/15/2016 9:11 AM

## 2016-11-15 NOTE — Progress Notes (Signed)
Patient talking to himself in his room at times.Patient states "the voices not bothering me much".Denies suicidal or homicidal ideations.Visible in the milieu.Minimal interactions with peers.Compliant with medications.Appetite & energy level good.Support & encouragement given.

## 2016-11-15 NOTE — Progress Notes (Signed)
D: Pt isolative and avoidant of socialization.  Attends activities but is on periphery of milieu. Medication compliant.  A: Monitored of 15 minute safety checks and maintained safety on the unit.    R: Pt continues to appear preoccupied, contracts for safety.

## 2016-11-15 NOTE — Progress Notes (Signed)
  [facility] Adult Case Management Discharge Plan :  Will you be returning to the same living situation after discharge:  Yes,  own apartment At discharge, do you have transportation home?: Yes,  brother Do you have the ability to pay for your medications: Yes,  medicare  Release of information consent forms completed and in the chart;  Patient's signature needed at discharge.  Patient to Follow up at: Follow-up Information    [facility]. Go on 11/22/2016.   Specialty:  Behavioral Health Why:  Please attend your hospital follow up appointment at [facility] on Friday, 11/22/16, at 8:30am.  Please bring a copy of your hospital discharge paperwork. Contact information: [place] [address] [phone]           Next level of care provider has access to [place] and Suicide Prevention discussed: Yes,  with brother  Have you used any form of tobacco in the last 30 days? (Cigarettes, Smokeless Tobacco, Cigars, and/or Pipes): No  Has patient been referred to the Quitline?: N/A patient is not a smoker  Patient has been referred for addiction treatment: Yes  Joanne Chars, LCSW 11/15/2016, 3:18 PM

## 2016-11-15 NOTE — Progress Notes (Signed)
CSW spoke to pt brother, Bayard More, who will come early afternoon tomorrow to see pt and will be available to take him home.  He is also able to help get pt to his [facility] appointments if they are early in the day.  Louie Casa works 3rd shift and goes to bed at [place], MSW, LCSW Clinical Social Worker 11/15/2016 9:18 AM

## 2016-11-15 NOTE — BHH Group Notes (Signed)
[place] Group Notes:  (Nursing/MHT/Case Management/Adjunct)  Date:  11/15/2016  Time:  12:39 AM  Type of Therapy:  Group Therapy  Participation Level:  Minimal  Participation Qua Summary of Progress/Problems:  Nehemiah Settle 11/15/2016, 12:39 AM

## 2016-11-15 NOTE — Progress Notes (Signed)
Pt appeared to sleep most of the night while monitored on 15 minute safety checks. 

## 2016-11-15 NOTE — Progress Notes (Addendum)
[facility] MD Progress Note  11/15/2016 12:18 PM Benjamin Braun  MRN:  [id number]  Subjective: Benjamin Braun is a 51 year old male with a history of schizophrenia admitted floridly psychotic, paranoid and hallucinating, in the context of medication noncompliance. He usually responds well to Zyprexa but has been more agitated, possibly cheeking medications. He refuses injectable antipsychotics. Lives independently.  11/11/2016. Benjamin Braun is asleep and hard to awake this morning. Last night I started Haldol in addition to Zyprexa as the patient continued to be very paranoid, delusional and hallucinating. He received a dose of Haldol on Saturday for agitation. The patient refuses injectable antipsychotics but has not been compliant with psychiatric care. We have difficilties finding him a local provider due to his insurance. We plan on giving him Haldol decanoate injection at discharge.  11/12/2016. Benjamin Braun was in bed again this morning difficult to arouse. He did get up for lunch. He is clearly attending to internal stimuli. He is complaint with medications. No program participation in spite of encouragement.  11/13/2016. Benjamin Braun is awake and aware today. He presents much better he is no longer paranoid and never talks about his neighbors or crips. He does admit that medication makes him feel better. His Depakote level is 93 indicating good adherence. He even agreed to take Haldol Decanoate injection. It remains to be seen if he complies as injections are against his religion. He reports no side effects. For the first time today he started worrying about paying his rent, feeding his fish and getting his dog back from a friend's house.  11/14/2016. Benjamin Braun denies any symptoms of depression, anxiety or psychosis. He is still hallucinating talking to his voices loudly and paranoid about medications. He accepted haldol decanoate injection on 11/13/2016. He is asking for discharge to pay his bills and take care of  his dog and fish.   11/15/2016. Benjamin Braun will be discharged tomorrow with is brother. We are not sure if he is at baseline as he still talks to the voices loudly and is slightly paranoid. He has been calling police repeaedly before admission due to paranoia. He accepts medications and reports no side effects. Sleep and appetite are good. No somatic complaints.  Per nursing: D: Pt isolative and avoidant of socialization.  Attends activities but is on periphery of milieu. Medication compliant.  A: Monitored of 15 minute safety checks and maintained safety on the unit.    R: Pt continues to appear preoccupied, contracts for safety.    Principal Problem: Undifferentiated schizophrenia ([place]) Diagnosis:   Patient Active Problem List   Diagnosis Date Noted  . Undifferentiated schizophrenia ([place]) [F20.3] 11/04/2016  . Noncompliance [Z91.19] 04/14/2015   Total Time spent with patient: 30 minutes  Past Psychiatric History: schizophrenia.  Past Medical History:  Past Medical History:  Diagnosis Date  . Anxiety   . Generalized anxiety disorder   . Schizophrenia [facility])     Past Surgical History:  Procedure Laterality Date  . TONSILLECTOMY     Family History:  Family History  Problem Relation Age of Onset  . Family history unknown: Yes   Family Psychiatric  History: unknown. Social History:  History  Alcohol Use No     History  Drug Use No    Social History   Social History  . Marital status: Single    Spouse name: N/A  . Number of children: N/A  . Years of education: N/A   Social History Main Topics  . Smoking status: Former Smoker  Types: Cigarettes    Quit date: 06/11/1999  . Smokeless tobacco: Never Used  . Alcohol use No  . Drug use: No  . Sexual activity: No   Other Topics Concern  . None   Social History Narrative  . None   Additional Social History:                         Sleep: Fair  Appetite:  Fair  Current Medications: Current  Facility-Administered Medications  Medication Dose Route Frequency Provider Last Rate Last Dose  . acetaminophen (TYLENOL) tablet 650 mg  650 mg Oral Q6H PRN Clapacs, [name], MD      . alum & mag hydroxide-simeth (MAALOX/MYLANTA) 200-200-20 MG/5ML suspension 30 mL  30 mL Oral Q4H PRN Clapacs, [name], MD      . cyclobenzaprine (FLEXERIL) tablet 5 mg  5 mg Oral TID Pucilowska, Jolanta B, MD   5 mg at 11/15/16 0830  . haloperidol (HALDOL) 2 MG/ML solution 5 mg  5 mg Oral BID Pucilowska, Jolanta B, MD   5 mg at 11/15/16 1610  . haloperidol decanoate (HALDOL DECANOATE) 100 MG/ML injection 100 mg  100 mg Intramuscular Q30 days Pucilowska, Jolanta B, MD   100 mg at 11/13/16 1706  . hydrOXYzine (ATARAX/VISTARIL) tablet 25 mg  25 mg Oral TID PRN Lenward Chancellor, MD   25 mg at 11/13/16 2205  . ibuprofen (ADVIL,MOTRIN) tablet 600 mg  600 mg Oral Q6H PRN Clapacs, [name], MD   600 mg at 11/06/16 0959  . magnesium hydroxide (MILK OF MAGNESIA) suspension 30 mL  30 mL Oral Daily PRN Clapacs, [name], MD      . OLANZapine (ZYPREXA) injection 10 mg  10 mg Intramuscular BID PRN Pucilowska, Jolanta B, MD      . OLANZapine zydis (ZYPREXA) disintegrating tablet 15 mg  15 mg Oral BID Pucilowska, Jolanta B, MD   15 mg at 11/15/16 0831  . traZODone (DESYREL) tablet 100 mg  100 mg Oral QHS Clapacs, [name], MD   100 mg at 11/14/16 2204    Lab Results:  No results found for this or any previous visit (from the past 48 hour(s)).  Blood Alcohol level:  Lab Results  Component Value Date   ETH <5 11/03/2016   ETH <5 96/09/5407    Metabolic Disorder Labs: Lab Results  Component Value Date   HGBA1C 5.5 11/05/2016   MPG 111 11/05/2016   No results found for: PROLACTIN Lab Results  Component Value Date   CHOL 166 11/05/2016   TRIG 95 11/05/2016   HDL 32 (L) 11/05/2016   CHOLHDL 5.2 11/05/2016   VLDL 19 11/05/2016   LDLCALC 115 (H) 11/05/2016   LDLCALC 132 (H) 04/15/2015    Physical Findings: AIMS: Facial  and Oral Movements Muscles of Facial Expression: None, normal Lips and Perioral Area: None, normal Jaw: None, normal Tongue: None, normal,Extremity Movements Upper (arms, wrists, hands, fingers): None, normal Lower (legs, knees, ankles, toes): None, normal, Trunk Movements Neck, shoulders, hips: None, normal, Overall Severity Severity of abnormal movements (highest score from questions above): None, normal Incapacitation due to abnormal movements: None, normal Patient's awareness of abnormal movements (rate only patient's report): No Awareness, Dental Status Current problems with teeth and/or dentures?: No Does patient usually wear dentures?: No  CIWA:    COWS:     Musculoskeletal: Strength & Muscle Tone: within normal limits Gait & Station: normal Patient leans: N/A  Psychiatric Specialty Exam:  Physical Exam  Nursing note and vitals reviewed. Psychiatric: His affect is blunt. His speech is delayed. He is withdrawn and actively hallucinating. Thought content is paranoid and delusional. Cognition and memory are normal. He expresses impulsivity.    Review of Systems  Psychiatric/Behavioral: Positive for hallucinations.  All other systems reviewed and are negative.   Blood pressure 123/88, pulse 96, temperature 97.9 F (36.6 C), temperature source Oral, resp. rate 18, height 5\' 6"  (1.676 m), weight 81.6 kg (180 lb), SpO2 100 %.Body mass index is 29.05 kg/m.  General Appearance: Casual  Eye Contact:  None  Speech:  Clear and Coherent  Volume:  Decreased  Mood:  Depressed  Affect:  Blunt  Thought Process:  Goal Directed and Descriptions of Associations: Intact  Orientation:  Full (Time, Place, and Person)  Thought Content:  Delusions, Hallucinations: Auditory and Paranoid Ideation  Suicidal Thoughts:  No  Homicidal Thoughts:  No  Memory:  Immediate;   Fair Recent;   Fair Remote;   Fair  Judgement:  Poor  Insight:  Lacking  Psychomotor Activity:  Psychomotor Retardation   Concentration:  Concentration: Fair and Attention Span: Fair  Recall:  [employer] of Knowledge:  Fair  Language:  Fair  Akathisia:  No  Handed:  Right  AIMS (if indicated):     Assets:  Communication Skills Desire for Improvement Financial Resources/Insurance Housing Physical Health Resilience  ADL's:  Intact  Cognition:  WNL  Sleep:  Number of Hours: 6.25     Treatment Plan Summary: Daily contact with patient to assess and evaluate symptoms and progress in treatment and Medication management   Benjamin Braun is a 51 year old male with a history of schizophrenia admitted in a psychotic episode in the context of medication noncompliance  1. Psychosis. He was switched to Zyprexa zydis 15 mg twice daily for psychosis. Zyprexa injection is available for agitation. We added depakote for mood stabilizationand Haldol for unrellenting psychotic symptoms. He agreed to take Haldol shot. VPA level 93.   2. Anxiety. He has been off clonazepam for a while. I will not restart it. Hydroxyzine  Is available.   3. Insomnia. Trazodone is available.   4. Metabolic syndrome.Lipid panel, TSH and HgbA1C are normal.    5. EKG. normal sinus rhythm, QTC 428.  6. Back pain. We restarted Flexeril.   7. Disposition. He will likely be discharged back to his apartment. So far we were unable to identify any providers in the area who work with his insurance.   I certify that the services received since the previous certification/recertification were and continue to be medically necessary as the treatment provided can be reasonably expected to improve the patient's condition; the medical record documents that the services furnished were intensive treatment services or their equivalent services, and this patient continues to need, on a daily basis, active treatment furnished directly by or requiring the supervision of inpatient psychiatric personnel.  Orson Slick, MD 11/15/2016, 12:18 PM

## 2016-11-16 MED ORDER — DIVALPROEX SODIUM 250 MG PO DR TAB: 750.0000 mg | DELAYED_RELEASE_TABLET | Freq: Two times a day (BID) | ORAL | 1 refills | Status: DC

## 2016-11-16 MED ORDER — DIVALPROEX SODIUM 500 MG PO DR TAB
750.0000 mg | DELAYED_RELEASE_TABLET | Freq: Two times a day (BID) | ORAL | Status: DC
  Administered 2016-11-16: 750 mg via ORAL
  Filled 2016-11-16: qty 1

## 2016-11-16 NOTE — Progress Notes (Addendum)
CSW spoke to pt's brother who will pick up patient at approximately 12:15.  CSW updated RN and provider and will have a family meeting with the pt's brother to encourage the pt's brother to support the pt in taking his medication on a scheduled basis in the future. Pt's brother stated he will be supportive of his brother in assisting him with taking his meds.  Alphonse Guild. Christana Angelica, Latanya Presser, LCAS Clinical Social Worker Ph: [phone]

## 2016-11-16 NOTE — BHH Group Notes (Signed)
[place] Group Notes:  (Nursing/MHT/Case Management/Adjunct)  Date:  11/16/2016  Time:  1:22 AM  Type of Therapy:  Evening Wrap-up Group  Participation Level:  Did Not Attend  Participation Quality:  N/A  Affect:  N/A  Cognitive:  N/A  Insight:  None  Engagement in Group:  Did Not Attend  Modes of Intervention:  Activity and Discussion  Summary of Progress/Problems:  Levonne Spiller 11/16/2016, 1:22 AM

## 2016-11-16 NOTE — Progress Notes (Signed)
Patient is alert and oriented x 4, breathing unlabored, and extremities x 4 within normal limits. Patient denies any SI/HI/AH/VH. Patient is compliant with medications and had breakfast and lunch. Patient received all his belongings and discharge instructions and verbalized understanding. Discharge instructions along with prescriptions was provided to brother. His brother verbalized understanding. No issues noted.

## 2016-11-16 NOTE — Plan of Care (Signed)
Problem: Activity: Goal: Will verbalize the importance of balancing activity with adequate rest periods Outcome: Progressing Patient reported that he felt he received some good rest last night and feels better.

## 2016-11-16 NOTE — BHH Suicide Risk Assessment (Signed)
[facility] Discharge Suicide Risk Assessment   Principal Problem: Undifferentiated schizophrenia [facility]) Discharge Diagnoses:  Patient Active Problem List   Diagnosis Date Noted  . Undifferentiated schizophrenia ([place]) [F20.3] 11/04/2016  . Noncompliance [Z91.19] 04/14/2015    Total Time spent with patient: 30 minutes  Musculoskeletal: Strength & Muscle Tone: within normal limits Gait & Station: normal Patient leans: N/A  Psychiatric Specialty Exam: Review of Systems  HENT: Positive for hearing loss.   Musculoskeletal: Positive for back pain.  Psychiatric/Behavioral: Positive for hallucinations.  All other systems reviewed and are negative.   Blood pressure 109/69, pulse (!) 107, temperature 98 F (36.7 C), temperature source Oral, resp. rate 18, height 5\' 6"  (1.676 m), weight 81.6 kg (180 lb), SpO2 100 %.Body mass index is 29.05 kg/m.  General Appearance: Casual  Eye Contact::  Good  Speech:  Clear and Coherent409  Volume:  Normal  Mood:  Anxious  Affect:  Appropriate  Thought Process:  Goal Directed and Descriptions of Associations: Intact  Orientation:  Full (Time, Place, and Person)  Thought Content:  Delusions, Hallucinations: Auditory and Paranoid Ideation  Suicidal Thoughts:  No  Homicidal Thoughts:  No  Memory:  Immediate;   Fair Recent;   Fair Remote;   Fair  Judgement:  Poor  Insight:  Shallow  Psychomotor Activity:  Normal  Concentration:  Fair  Recall:  [place]  Language: Fair  Akathisia:  No  Handed:  Right  AIMS (if indicated):     Assets:  Communication Skills Desire for Improvement Financial Resources/Insurance Housing Physical Health Resilience Social Support  Sleep:  Number of Hours: 7.15  Cognition: WNL  ADL's:  Intact   Mental Status Per Nursing Assessment::   On Admission:     Demographic Factors:  Male, Caucasian and Living alone  Loss Factors: NA  Historical Factors: Prior suicide attempts, Family history of  mental illness or substance abuse and Impulsivity  Risk Reduction Factors:   Sense of responsibility to family and Positive social support  Continued Clinical Symptoms:  Schizophrenia:   Paranoid or undifferentiated type  Cognitive Features That Contribute To Risk:  None    Suicide Risk:  Minimal: No identifiable suicidal ideation.  Patients presenting with no risk factors but with morbid ruminations; may be classified as minimal risk based on the severity of the depressive symptoms  Follow-up Information    [facility]. Go on 11/22/2016.   Specialty:  Behavioral Health Why:  Please attend your hospital follow up appointment at [facility] on Friday, 11/22/16, at 8:30am.  Please bring a copy of your hospital discharge paperwork. Contact information: [place] [address] [phone]           Plan Of Care/Follow-up recommendations:  Activity:  as tolerated. Diet:  low sodium heart healthy. Other:  keep follow up appointments.  Orson Slick, MD 11/16/2016, 7:52 AM

## 2016-11-16 NOTE — Progress Notes (Signed)
  [facility] Adult Case Management Discharge Plan :  Will you be returning to the same living situation after discharge:  Yes,  lives alone At discharge, do you have transportation home?: Yes,  brother Do you have the ability to pay for your medications: Yes,  insured  Release of information consent forms completed and in the chart;  Patient's signature needed at discharge.  Patient to Follow up at: Follow-up Information    [facility]. Go on 11/22/2016.   Specialty:  Behavioral Health Why:  Please attend your hospital follow up appointment at [facility] on Friday, 11/22/16, at 8:30am.  Please bring a copy of your hospital discharge paperwork. Contact information: [place] [address] [phone]           Next level of care provider has access to [place] and Suicide Prevention discussed: Yes,  reviewd w brother Namari Breton  Have you used any form of tobacco in the last 30 days? (Cigarettes, Smokeless Tobacco, Cigars, and/or Pipes): No  Has patient been referred to the Quitline?: N/A patient is not a smoker  Patient has been referred for addiction treatment: Yes  Beverely Pace 11/16/2016, 1:23 PM
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
## 2017-02-25 ENCOUNTER — Telehealth: Payer: Self-pay | Admitting: Nurse Practitioner

## 2017-02-25 NOTE — Telephone Encounter (Signed)
Called and left name but no message. Call back to see what he needs.

## 2017-06-18 ENCOUNTER — Inpatient Hospital Stay
Admission: AD | Admit: 2017-06-18 | Discharge: 2017-06-26 | DRG: 885 | Disposition: A | Discharge status: Home / Self Care | Payer: PPO | Attending: Psychiatry | Admitting: Psychiatry

## 2017-06-18 ENCOUNTER — Encounter: Payer: Self-pay | Admitting: Emergency Medicine

## 2017-06-18 ENCOUNTER — Emergency Department
Admission: EM | Admit: 2017-06-18 | Discharge: 2017-06-18 | Disposition: A | Discharge status: Other Acute Inpatient Hospital | Payer: PPO | Attending: Emergency Medicine | Admitting: Emergency Medicine

## 2017-06-18 ENCOUNTER — Other Ambulatory Visit: Payer: Self-pay

## 2017-06-18 DIAGNOSIS — Z79899 Other long term (current) drug therapy: Secondary | ICD-10-CM | POA: Insufficient documentation

## 2017-06-18 DIAGNOSIS — F203 Undifferentiated schizophrenia: Principal | ICD-10-CM | POA: Diagnosis present

## 2017-06-18 DIAGNOSIS — F419 Anxiety disorder, unspecified: Secondary | ICD-10-CM | POA: Diagnosis not present

## 2017-06-18 DIAGNOSIS — Z9119 Patient's noncompliance with other medical treatment and regimen: Secondary | ICD-10-CM | POA: Diagnosis not present

## 2017-06-18 DIAGNOSIS — Z046 Encounter for general psychiatric examination, requested by authority: Secondary | ICD-10-CM | POA: Diagnosis not present

## 2017-06-18 DIAGNOSIS — E7800 Pure hypercholesterolemia, unspecified: Secondary | ICD-10-CM | POA: Diagnosis not present

## 2017-06-18 DIAGNOSIS — F29 Unspecified psychosis not due to a substance or known physiological condition: Secondary | ICD-10-CM | POA: Diagnosis not present

## 2017-06-18 DIAGNOSIS — Z87891 Personal history of nicotine dependence: Secondary | ICD-10-CM

## 2017-06-18 DIAGNOSIS — Z9114 Patient's other noncompliance with medication regimen: Secondary | ICD-10-CM

## 2017-06-18 DIAGNOSIS — Z91199 Patient's noncompliance with other medical treatment and regimen due to unspecified reason: Secondary | ICD-10-CM

## 2017-06-18 DIAGNOSIS — F22 Delusional disorders: Secondary | ICD-10-CM | POA: Diagnosis present

## 2017-06-18 DIAGNOSIS — H9190 Unspecified hearing loss, unspecified ear: Secondary | ICD-10-CM | POA: Diagnosis not present

## 2017-06-18 LAB — COMPREHENSIVE METABOLIC PANEL
ALT: 31 U/L (ref 17–63)
AST: 28 U/L (ref 15–41)
Albumin: 4.4 g/dL (ref 3.5–5.0)
Alkaline Phosphatase: 104 U/L (ref 38–126)
Anion gap: 6 (ref 5–15)
BILIRUBIN TOTAL: 0.6 mg/dL (ref 0.3–1.2)
BUN: 27 mg/dL — AB (ref 6–20)
CHLORIDE: 104 mmol/L (ref 101–111)
CO2: 28 mmol/L (ref 22–32)
CREATININE: 1.14 mg/dL (ref 0.61–1.24)
Calcium: 9 mg/dL (ref 8.9–10.3)
GFR calc Af Amer: >60 mL/min (ref >60)
Glucose, Bld: 167 mg/dL — ABNORMAL HIGH (ref 65–99)
Potassium: 3.6 mmol/L (ref 3.5–5.1)
Sodium: 138 mmol/L (ref 135–145)
TOTAL PROTEIN: 7.6 g/dL (ref 6.5–8.1)

## 2017-06-18 LAB — URINE DRUG SCREEN, QUALITATIVE (ARMC ONLY)
Amphetamines, Ur Screen: NOT DETECTED
Barbiturates, Ur Screen: NOT DETECTED
Benzodiazepine, Ur Scrn: NOT DETECTED
CANNABINOID 50 NG, UR ~~LOC~~: NOT DETECTED
Cocaine Metabolite,Ur ~~LOC~~: NOT DETECTED
MDMA (ECSTASY) UR SCREEN: NOT DETECTED
Methadone Scn, Ur: NOT DETECTED
Opiate, Ur Screen: NOT DETECTED
PHENCYCLIDINE (PCP) UR S: NOT DETECTED
Tricyclic, Ur Screen: NOT DETECTED

## 2017-06-18 LAB — CBC WITH DIFFERENTIAL/PLATELET
Basophils Absolute: 0.1 10*3/uL (ref 0–0.1)
Basophils Relative: 1 %
EOS ABS: 0.2 10*3/uL (ref 0–0.7)
EOS PCT: 2 %
HCT: 47.3 % (ref 40.0–52.0)
Hemoglobin: 16.2 g/dL (ref 13.0–18.0)
LYMPHS ABS: 3.3 10*3/uL (ref 1.0–3.6)
Lymphocytes Relative: 39 %
MCH: 29.4 pg (ref 26.0–34.0)
MCHC: 34.2 g/dL (ref 32.0–36.0)
MCV: 85.9 fL (ref 80.0–100.0)
MONO ABS: 0.7 10*3/uL (ref 0.2–1.0)
MONOS PCT: 9 %
Neutro Abs: 4.1 10*3/uL (ref 1.4–6.5)
Neutrophils Relative %: 49 %
PLATELETS: 267 10*3/uL (ref 150–440)
RBC: 5.5 MIL/uL (ref 4.40–5.90)
RDW: 13.3 % (ref 11.5–14.5)
WBC: 8.4 10*3/uL (ref 3.8–10.6)

## 2017-06-18 LAB — ETHANOL

## 2017-06-18 LAB — SALICYLATE LEVEL: Salicylate Lvl: <7 mg/dL (ref 2.8–30.0)

## 2017-06-18 LAB — ACETAMINOPHEN LEVEL: Acetaminophen (Tylenol), Serum: <10 ug/mL — ABNORMAL LOW (ref 10–30)

## 2017-06-18 MED ORDER — ACETAMINOPHEN 325 MG PO TABS
650.0000 mg | ORAL_TABLET | Freq: Four times a day (QID) | ORAL | Status: DC | PRN
  Administered 2017-06-25: 650 mg via ORAL
  Filled 2017-06-18: qty 2

## 2017-06-18 MED ORDER — HYDROXYZINE HCL 25 MG PO TABS
25.0000 mg | ORAL_TABLET | Freq: Three times a day (TID) | ORAL | Status: DC | PRN
  Administered 2017-06-18 – 2017-06-25 (×4): 25 mg via ORAL
  Filled 2017-06-18 (×4): qty 1

## 2017-06-18 MED ORDER — OLANZAPINE 10 MG PO TABS
10.0000 mg | ORAL_TABLET | Freq: Every day | ORAL | Status: DC
  Administered 2017-06-18: 10 mg via ORAL
  Filled 2017-06-18: qty 1

## 2017-06-18 MED ORDER — MAGNESIUM HYDROXIDE 400 MG/5ML PO SUSP: 30.0000 mL | Freq: Every day | ORAL | Status: DC | PRN

## 2017-06-18 MED ORDER — ALUM & MAG HYDROXIDE-SIMETH 200-200-20 MG/5ML PO SUSP: 30.0000 mL | ORAL | Status: DC | PRN

## 2017-06-18 MED ORDER — OLANZAPINE 10 MG PO TABS: 10.0000 mg | ORAL_TABLET | Freq: Every day | ORAL | Status: DC

## 2017-06-18 MED ORDER — TRAZODONE HCL 100 MG PO TABS
100.0000 mg | ORAL_TABLET | Freq: Every evening | ORAL | Status: DC | PRN
  Administered 2017-06-18 – 2017-06-25 (×4): 100 mg via ORAL
  Filled 2017-06-18 (×4): qty 1

## 2017-06-18 NOTE — ED Notes (Signed)
ED  Is the patient under IVC or is there intent for IVC: Yes.   Is the patient medically cleared: Yes.   Is there vacancy in the ED BHU: Yes.   Is the population mix appropriate for patient: Yes.   Is the patient awaiting placement in inpatient or outpatient setting: Yes.   Has the patient had a psychiatric consult:   Consult pending     Survey of unit performed for contraband, proper placement and condition of furniture, tampering with fixtures in bathroom, shower, and each patient room: Yes.  ; Findings:  APPEARANCE/BEHAVIOR Calm and cooperative NEURO ASSESSMENT Orientation: oriented x4  Denies pain Hallucinations: No.None noted (Hallucinations)  Denies at present  Speech: Normal Gait: normal RESPIRATORY ASSESSMENT Even  Unlabored respirations  CARDIOVASCULAR ASSESSMENT Pulses equal   regular rate  Skin warm and dry   GASTROINTESTINAL ASSESSMENT no GI complaint EXTREMITIES Full ROM  PLAN OF CARE Provide calm/safe environment. Vital signs assessed twice daily. ED BHU Assessment once each 12-hour shift. Collaborate with TTS daily or as condition indicates. Assure the ED provider has rounded once each shift. Provide and encourage hygiene. Provide redirection as needed. Assess for escalating behavior; address immediately and inform ED provider.  Assess family dynamic and appropriateness for visitation as needed: Yes.  ; If necessary, describe findings:  Educate the patient/family about BHU procedures/visitation: Yes.  ; If necessary, describe findings:
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
## 2017-06-18 NOTE — ED Notes (Signed)
He has ambulated to and from the BR with a steady gait  - NAD observed  Continue to monitor

## 2017-06-18 NOTE — Consult Note (Signed)
[place] [facility] Consult   Reason for Consult: Consult for 52 year old man with a history of schizophrenia brought into the hospital by law enforcement after expressing his psychotic symptoms to them Referring Physician: Quentin Cornwall Patient Identification: Benjamin Braun MRN:  [id number] Principal Diagnosis: Undifferentiated schizophrenia [facility]) Diagnosis:   Patient Active Problem List   Diagnosis Date Noted  . Undifferentiated schizophrenia ([place]) [F20.3] 11/04/2016  . Noncompliance [Z91.19] 04/14/2015    Total Time spent with patient: 1 hour  Subjective:   Benjamin Braun is a 52 y.o. male patient admitted with "they been messing with me and the sheriff did not believe it".  HPI: Patient interviewed chart reviewed including old admission notes.  This patient was brought to the emergency room last night after he called law enforcement to his house to report on his belief that his neighbors were "messing with him" in various ways.  He tells me that his neighbors are drug users and they do various things to upset him.  He complains that they break into his house and steal his things, they make noises at night, they spray chemicals with smells into the air.  He talks about how he was hearing a voice that told him to go check his water meter.  Somehow that had some play into his psychotic symptoms as well.  He called the sheriff last night and insisted that his neighbors were doing something wrong.  When the sheriff disagreed it sounds like the patient became somewhat belligerent and that triggered the sheriff to bring him into the hospital.  Patient admits he has not been on his antipsychotic recently.  He denies any alcohol or drug abuse.  Wakes up a lot and does not sleep well at home.  Does not describe any other acute medical problems however.  He denies any thought of hurting himself and denies any thought of confronting his neighbors are doing anything violent.  Social history: Patient  lives by himself in an apartment.  Sounds like he has some relatives who check in on him regularly.  Medical history: He has a hearing aid in his right ear but when he has edema and appears to be able to hear without any difficulty.  He has an elevated blood sugar here but does not have a past history of diabetes.  Substance abuse history: No history of alcohol or drug abuse reported or in evidence in the chart  Past Psychiatric History: Patient has a history of schizophrenia has had several prior hospitalizations.  Last time was in June of this year when he was here in the hospital.  He has responded reasonably well to antipsychotics and particularly seems to do well with olanzapine but has repeatedly refused to consider being on long-acting injectables.  His insight is partial at best.  No history of suicide attempts or violence.  Risk to Self: Suicidal Ideation: No Suicidal Intent: No Is patient at risk for suicide?: No Suicidal Plan?: No Access to Means: No What has been your use of drugs/alcohol within the last 12 months?: Reports of none How many times?: 0 Other Self Harm Risks: Reports of none Triggers for Past Attempts: None known Intentional Self Injurious Behavior: None Risk to Others: Homicidal Ideation: No Thoughts of Harm to Others: No Current Homicidal Intent: No Current Homicidal Plan: No Access to Homicidal Means: No Identified Victim: Reports of none History of harm to others?: No Assessment of Violence: None Noted Violent Behavior Description: Reports of none Does patient have access to  weapons?: No Criminal Charges Pending?: No Does patient have a court date: No Prior Inpatient Therapy: Prior Inpatient Therapy: Yes Prior Therapy Dates: 10/2016 & 04/2015,  Prior Therapy Facilty/Provider(s): [facility] Reason for Treatment: Psychosis Prior Outpatient Therapy: Prior Outpatient Therapy: Yes Prior Therapy Dates: Unknown Prior Therapy Facilty/Provider(s):  Unknown Reason for Treatment: Unknown Does patient have an ACCT team?: No Does patient have Intensive In-House Services?  : No Does patient have [facility] services? : No Does patient have P4CC services?: No  Past Medical History:  Past Medical History:  Diagnosis Date  . Anxiety   . Generalized anxiety disorder   . Schizophrenia [facility])     Past Surgical History:  Procedure Laterality Date  . TONSILLECTOMY     Family History:  Family History  Family history unknown: Yes   Family Psychiatric  History: Does not know of any family history of mental illness Social History:  Social History   Substance and Sexual Activity  Alcohol Use No  . Alcohol/week: 0.0 oz     Social History   Substance and Sexual Activity  Drug Use No    Social History   Socioeconomic History  . Marital status: Single    Spouse name: None  . Number of children: None  . Years of education: None  . Highest education level: None  Social Needs  . Financial resource strain: None  . Food insecurity - worry: None  . Food insecurity - inability: None  . Transportation needs - medical: None  . Transportation needs - non-medical: None  Occupational History  . None  Tobacco Use  . Smoking status: Former Smoker    Types: Cigarettes    Last attempt to quit: 06/11/1999    Years since quitting: 18.0  . Smokeless tobacco: Never Used  Substance and Sexual Activity  . Alcohol use: No    Alcohol/week: 0.0 oz  . Drug use: No  . Sexual activity: No  Other Topics Concern  . None  Social History Narrative  . None   Additional Social History:    Allergies:  No Known Allergies  Labs:  Results for orders placed or performed during the hospital encounter of 06/18/17 (from the past 48 hour(s))  Comprehensive metabolic panel     Status: Abnormal   Collection Time: 06/18/17  3:40 AM  Result Value Ref Range   Sodium 138 135 - 145 mmol/L   Potassium 3.6 3.5 - 5.1 mmol/L   Chloride 104 101 - 111 mmol/L   CO2  28 22 - 32 mmol/L   Glucose, Bld 167 (H) 65 - 99 mg/dL   BUN 27 (H) 6 - 20 mg/dL   Creatinine, Ser 1.14 0.61 - 1.24 mg/dL   Calcium 9.0 8.9 - 10.3 mg/dL   Total Protein 7.6 6.5 - 8.1 g/dL   Albumin 4.4 3.5 - 5.0 g/dL   AST 28 15 - 41 U/L   ALT 31 17 - 63 U/L   Alkaline Phosphatase 104 38 - 126 U/L   Total Bilirubin 0.6 0.3 - 1.2 mg/dL   GFR calc non Af Amer >60 >60 mL/min   GFR calc Af Amer >60 >60 mL/min    Comment: (NOTE) The eGFR has been calculated using the CKD EPI equation. This calculation has not been validated in all clinical situations. eGFR's persistently <60 mL/min signify possible Chronic Kidney Disease.    Anion gap 6 5 - 15    Comment: Performed at [facility], [address] [place], [place] [address]  Ethanol     Status: None   Collection Time: 06/18/17  3:40 AM  Result Value Ref Range   Alcohol, Ethyl (B) <10 <10 mg/dL    Comment:        LOWEST DETECTABLE LIMIT FOR SERUM ALCOHOL IS 10 mg/dL FOR MEDICAL PURPOSES ONLY Performed at [facility], [address], [place], [place] [address]   Urine Drug Screen, Qualitative     Status: None   Collection Time: 06/18/17  3:40 AM  Result Value Ref Range   Tricyclic, Ur Screen NONE DETECTED NONE DETECTED   Amphetamines, Ur Screen NONE DETECTED NONE DETECTED   MDMA (Ecstasy)Ur Screen NONE DETECTED NONE DETECTED   Cocaine Metabolite,Ur [place] NONE DETECTED NONE DETECTED   Opiate, Ur Screen NONE DETECTED NONE DETECTED   Phencyclidine (PCP) Ur S NONE DETECTED NONE DETECTED   Cannabinoid 50 Ng, Ur [place] NONE DETECTED NONE DETECTED   Barbiturates, Ur Screen NONE DETECTED NONE DETECTED   Benzodiazepine, Ur Scrn NONE DETECTED NONE DETECTED   Methadone Scn, Ur NONE DETECTED NONE DETECTED    Comment: (NOTE) Tricyclics + metabolites, urine    Cutoff 1000 ng/mL Amphetamines + metabolites, urine  Cutoff 1000 ng/mL MDMA (Ecstasy), urine              Cutoff 500 ng/mL Cocaine Metabolite, urine          Cutoff 300  ng/mL Opiate + metabolites, urine        Cutoff 300 ng/mL Phencyclidine (PCP), urine         Cutoff 25 ng/mL Cannabinoid, urine                 Cutoff 50 ng/mL Barbiturates + metabolites, urine  Cutoff 200 ng/mL Benzodiazepine, urine              Cutoff 200 ng/mL Methadone, urine                   Cutoff 300 ng/mL The urine drug screen provides only a preliminary, unconfirmed analytical test result and should not be used for non-medical purposes. Clinical consideration and professional judgment should be applied to any positive drug screen result due to possible interfering substances. A more specific alternate chemical method must be used in order to obtain a confirmed analytical result. Gas chromatography / mass spectrometry (GC/MS) is the preferred confirmat ory method. Performed at [facility], [address] [place], [place] [address]   CBC with Diff     Status: None   Collection Time: 06/18/17  3:40 AM  Result Value Ref Range   WBC 8.4 3.8 - 10.6 K/uL   RBC 5.50 4.40 - 5.90 MIL/uL   Hemoglobin 16.2 13.0 - 18.0 g/dL   HCT 47.3 40.0 - 52.0 %   MCV 85.9 80.0 - 100.0 fL   MCH 29.4 26.0 - 34.0 pg   MCHC 34.2 32.0 - 36.0 g/dL   RDW 13.3 11.5 - 14.5 %   Platelets 267 150 - 440 K/uL   Neutrophils Relative % 49 %   Neutro Abs 4.1 1.4 - 6.5 K/uL   Lymphocytes Relative 39 %   Lymphs Abs 3.3 1.0 - 3.6 K/uL   Monocytes Relative 9 %   Monocytes Absolute 0.7 0.2 - 1.0 K/uL   Eosinophils Relative 2 %   Eosinophils Absolute 0.2 0 - 0.7 K/uL   Basophils Relative 1 %   Basophils Absolute 0.1 0 - 0.1 K/uL    Comment: Performed at [facility], [address] [place], [place] [address]  Salicylate level     Status: None   Collection Time: 06/18/17  3:40 AM  Result Value Ref Range   Salicylate Lvl <8.8 2.8 - 30.0 mg/dL    Comment: Performed at [facility], [address] [place], [place] [address]  Acetaminophen level     Status: Abnormal    Collection Time: 06/18/17  3:40 AM  Result Value Ref Range   Acetaminophen (Tylenol), Serum <10 (L) 10 - 30 ug/mL    Comment:        THERAPEUTIC CONCENTRATIONS VARY SIGNIFICANTLY. A RANGE OF 10-30 ug/mL MAY BE AN EFFECTIVE CONCENTRATION FOR MANY PATIENTS. HOWEVER, SOME ARE BEST TREATED AT CONCENTRATIONS OUTSIDE THIS RANGE. ACETAMINOPHEN CONCENTRATIONS >150 ug/mL AT 4 HOURS AFTER INGESTION AND >50 ug/mL AT 12 HOURS AFTER INGESTION ARE OFTEN ASSOCIATED WITH TOXIC REACTIONS. Performed at [facility], [address], [place], [place] [address]     Current Facility-Administered Medications  Medication Dose Route Frequency Provider Last Rate Last Dose  . OLANZapine (ZYPREXA) tablet 10 mg  10 mg Oral QHS Clapacs, [name], MD       Current Outpatient Medications  Medication Sig Dispense Refill  . cyclobenzaprine (FLEXERIL) 10 MG tablet Take 1 tablet (10 mg total) by mouth every 8 (eight) hours as needed (for back/neck pain). 90 tablet 1  . divalproex (DEPAKOTE) 250 MG DR tablet Take 3 tablets (750 mg total) by mouth every 12 (twelve) hours. 180 tablet 1  . haloperidol decanoate (HALDOL DECANOATE) 100 MG/ML injection Inject 1 mL (100 mg total) into the muscle every 30 (thirty) days. Next injection on 12/09/2016. 1 mL 1  . hydrOXYzine (ATARAX/VISTARIL) 50 MG tablet Take 1 tablet (50 mg total) by mouth at bedtime. 90 tablet 1  . OLANZapine (ZYPREXA) 15 MG tablet Take 1 tablet (15 mg total) by mouth at bedtime. 30 tablet 1  . traZODone (DESYREL) 100 MG tablet Take 1 tablet (100 mg total) by mouth at bedtime. 30 tablet 1    Musculoskeletal: Strength & Muscle Tone: within normal limits Gait & Station: normal Patient leans: N/A  Psychiatric Specialty Exam: Physical Exam  Nursing note and vitals reviewed. Constitutional: He appears well-developed and well-nourished.  HENT:  Head: Normocephalic and atraumatic.    Eyes: Conjunctivae are normal. Pupils are equal, round, and  reactive to light.  Neck: Normal range of motion.  Cardiovascular: Regular rhythm and normal heart sounds.  Respiratory: Effort normal. No respiratory distress.  GI: Soft.  Musculoskeletal: Normal range of motion.  Neurological: He is alert.  Skin: Skin is warm and dry.  Psychiatric: His affect is labile and inappropriate. His speech is tangential. He is hyperactive. He is not combative. Thought content is paranoid and delusional. Cognition and memory are impaired. He expresses impulsivity. He expresses no homicidal and no suicidal ideation.    Review of Systems  Constitutional: Negative.   HENT: Negative.   Eyes: Negative.   Respiratory: Negative.   Cardiovascular: Negative.   Gastrointestinal: Negative.   Musculoskeletal: Negative.   Skin: Negative.   Neurological: Negative.   Psychiatric/Behavioral: Positive for hallucinations. Negative for depression, memory loss, substance abuse and suicidal ideas. The patient is nervous/anxious and has insomnia.     Blood pressure 131/90, pulse (!) 105, temperature 98 F (36.7 C), temperature source Oral, resp. rate 18, height 5' 6"  (1.676 m), weight 91.2 kg (201 lb), SpO2 99 %.Body mass index is 32.44 kg/m.  General Appearance: Casual  Eye Contact:  Fair  Speech:  Pressured  Volume:  Increased  Mood:  Irritable  Affect:  Inappropriate  Thought Process:  Disorganized  Orientation:  Full (Time, Place, and Person)  Thought Content:  Illogical, Delusions, Hallucinations: Auditory and Paranoid Ideation  Suicidal Thoughts:  No  Homicidal Thoughts:  No  Memory:  Immediate;   Fair Recent;   Fair Remote;   Fair  Judgement:  Impaired  Insight:  Shallow  Psychomotor Activity:  Normal and Psychomotor Retardation  Concentration:  Concentration: Fair  Recall:  [employer] of Knowledge:  Fair  Language:  Fair  Akathisia:  No  Handed:  Right  AIMS (if indicated):     Assets:  Desire for Improvement Financial  Resources/Insurance Housing Physical Health  ADL's:  Intact  Cognition:  Impaired,  Mild  Sleep:        Treatment Plan Summary: Daily contact with patient to assess and evaluate symptoms and progress in treatment, Medication management and Plan 52 year old man with schizophrenia who his psychosis has been acting up and causing him to have some conflict with law enforcement and causing him some discomfort and agitation at home.  Patient would function much better to be back on his medicine.  He is agreeable tentatively to admission to the psychiatric ward.  Restart olanzapine for now starting with 10 mg at night.  EKG will be ordered and full set of labs obtained.  Patient will be engaged in appropriate treatment on the unit.  Case reviewed with TTS and emergency room physician.  Disposition: Recommend psychiatric Inpatient admission when medically cleared. Supportive therapy provided about ongoing stressors.  Alethia Berthold, MD 06/18/2017 3:11 PM

## 2017-06-18 NOTE — Plan of Care (Signed)
New Admission  Not Progressing Coping: Level of anxiety will decrease 06/18/2017 2309 - Not Progressing by Derek Mound, RN Pain Managment: General experience of comfort will improve 06/18/2017 2309 - Not Progressing by Derek Mound, RN Safety: Ability to remain free from injury will improve 06/18/2017 2309 - Not Progressing by Derek Mound, RN Activity: Sleeping patterns will improve 06/18/2017 2309 - Not Progressing by Derek Mound, RN Education: Knowledge of [place] Education information/materials will improve 06/18/2017 2309 - Not Progressing by Derek Mound, RN Mental status will improve 06/18/2017 2309 - Not Progressing by Derek Mound, RN Verbalization of understanding the information provided will improve 06/18/2017 2309 - Not Progressing by Derek Mound, RN Coping: Ability to verbalize frustrations and anger appropriately will improve 06/18/2017 2309 - Not Progressing by Derek Mound, RN Ability to demonstrate self-control will improve 06/18/2017 2309 - Not Progressing by Derek Mound, RN Health Behavior/Discharge Planning: Identification of resources available to assist in meeting health care needs will improve 06/18/2017 2309 - Not Progressing by Derek Mound, RN Compliance with treatment plan for underlying cause of condition will improve 06/18/2017 2309 - Not Progressing by Derek Mound, RN Safety: Periods of time without injury will increase 06/18/2017 2309 - Not Progressing by Derek Mound, RN

## 2017-06-18 NOTE — Progress Notes (Addendum)
Patient ID: Benjamin Braun, male   DOB: November 27, 1965, 52 y.o.   MRN: [id number] 52 year old caucasian male admitted IVC for paranoid schizophrenia. Patient was picked up by [place] sheriff due to repeatedly calling 911 regarding paranoid thoughts about neighbors. When asked, patient states he is here because the police lied. Patient speech is delusional, pressured, rapid, and loud. Patient moves from one subject to another and believes his neighbors are drug dealers and Crips who have repeatedly broken into his home (where he says he lives alone) and spy on him. Patient believes the police are in cahoots with said neighbors in order to harm him. Patient believes the Crips and the police have killed several of his family members dating back as far as 55 when his step mother's neck was broken while on her way to the restaurant she owned. Patient states that the Crips had a trial for him while he was incarcerated and condemned him to death at that time. He believes he is not dead because God has intervened by sending [place] to [place], terrorism in [place], [place] all over the world, and "melting Obama's house." Patient identifies as a Museum/gallery exhibitions officer but also spoke of his belief in the [place] which made it rain for more than 40 days in [place]the [place] in [place]. Patient believes the police are helping the [place]. States he is currently awaiting trial for a physical altercation with the police in which he is accused of blinding an Garment/textile technologist. Patient was unwilling to tell me anymore about that. Patient would not allow staff to shake his hand. States, "There's no need to touch anyone." During skin assessment, completed with Freeman Caldron, patient repeatedly asked, "What's missing? What are you looking for?" Patient reports hearing loss in left ear. Reports that "someone" has poisoned his water causing blindness so he can no longer read. Patient is able to see AEB his behavior and ability to navigate the unit. Refused to sign  forms due to paranoia. Denies drug, ETOH, and tobacco use. UDS negative. Requests "a nerve pill" and is given Vistaril. Reports poor sleep/insomnia. Given Trazodone. Patient is alert and oriented x4. Compliant with assessment other than signing of forms. Q 15 minute checks initiated. Will continue to monitor throughout the shift. Patient slept 6.75 hours. EKG performed, copy in chart. Remains paranoid and delusional upon waking. Reports his electricity is going to get turned off. States, "Y'all don't care about that." Encouraged patient to share concerns regarding his residence to [employer]. Will endorse care to oncoming shift.

## 2017-06-18 NOTE — ED Notes (Signed)
Patient observed lying in bed with eyes closed  Even, unlabored respirations observed   NAD pt appears to be sleeping  I will continue to monitor along with every 15 minute visual observations and ongoing security monitoring    

## 2017-06-18 NOTE — ED Notes (Signed)
ED Provider at bedside. 

## 2017-06-18 NOTE — ED Notes (Signed)
IVC  SEEN BY  DR  CLAPACS  PENDING  GOING  TO  BEH  MED  TONIGHT

## 2017-06-18 NOTE — ED Notes (Signed)
BEHAVIORAL HEALTH ROUNDING Patient sleeping: No. Patient alert and oriented: yes Behavior appropriate: Yes.  ; If no, describe:  Nutrition and fluids offered: yes Toileting and hygiene offered: Yes  Sitter present: q15 minute observations and security  monitoring Law enforcement present: Yes  ODS  

## 2017-06-18 NOTE — ED Notes (Signed)
Pt. Requesting ginger ale to drink, pt. Given cup of Ginger ale.

## 2017-06-18 NOTE — ED Notes (Addendum)
I of 1 bag to [employer]
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
## 2017-06-18 NOTE — Tx Team (Signed)
Initial Treatment Plan 06/18/2017 11:05 PM Glendell Docker [id number]    PATIENT STRESSORS: Legal issue Other: Paranoia regarding neighbors and police   PATIENT STRENGTHS: Active sense of humor Average or above average intelligence Capable of independent living Communication skills   PATIENT IDENTIFIED PROBLEMS: Paranoia 06/18/2017                     DISCHARGE CRITERIA:  Improved stabilization in mood, thinking, and/or behavior Motivation to continue treatment in a less acute level of care Verbal commitment to aftercare and medication compliance  PRELIMINARY DISCHARGE PLAN: Outpatient therapy Return to previous living arrangement  PATIENT/FAMILY INVOLVEMENT: This treatment plan has been presented to and reviewed with the patient, Benjamin Braun, and/or family member.  The patient and family have been given the opportunity to ask questions and make suggestions.  Derek Mound, RN 06/18/2017, 11:05 PM

## 2017-06-18 NOTE — ED Provider Notes (Signed)
-----------------------------------------   3:40 PM on 06/18/2017 -----------------------------------------  The patient has been seen and evaluated by psychiatry, they will be admitting to their service once a bed becomes available.   Harvest Dark, MD 06/18/17 1540
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
## 2017-06-18 NOTE — ED Notes (Signed)
Pt. Given phone, pt talked to friend to care for dog.

## 2017-06-18 NOTE — ED Notes (Signed)
Pt. Requesting to make phone call to friend at 8 pm, so friend could check on pet dog.

## 2017-06-18 NOTE — ED Notes (Signed)
Warm blanket given

## 2017-06-18 NOTE — ED Notes (Signed)

## 2017-06-19 ENCOUNTER — Encounter: Payer: Self-pay | Admitting: Psychiatry

## 2017-06-19 DIAGNOSIS — F203 Undifferentiated schizophrenia: Principal | ICD-10-CM

## 2017-06-19 LAB — HEMOGLOBIN A1C
HEMOGLOBIN A1C: 5.4 % (ref 4.8–5.6)
Mean Plasma Glucose: 108.28 mg/dL

## 2017-06-19 LAB — LIPID PANEL
Cholesterol: 240 mg/dL — ABNORMAL HIGH (ref 0–200)
HDL: 36 mg/dL — ABNORMAL LOW (ref >40)
LDL Cholesterol: 146 mg/dL — ABNORMAL HIGH (ref 0–99)
Total CHOL/HDL Ratio: 6.7 RATIO
Triglycerides: 290 mg/dL — ABNORMAL HIGH (ref <150)
VLDL: 58 mg/dL — AB (ref 0–40)

## 2017-06-19 LAB — TSH: TSH: 1.83 u[IU]/mL (ref 0.350–4.500)

## 2017-06-19 MED ORDER — OLANZAPINE 10 MG PO TABS
10.0000 mg | ORAL_TABLET | Freq: Two times a day (BID) | ORAL | Status: DC
Start: 2017-06-19 — End: 2017-06-23
  Administered 2017-06-19 – 2017-06-23 (×7): 10 mg via ORAL
  Filled 2017-06-19 (×7): qty 1

## 2017-06-19 MED ORDER — DIVALPROEX SODIUM 500 MG PO DR TAB
500.0000 mg | DELAYED_RELEASE_TABLET | Freq: Three times a day (TID) | ORAL | Status: DC
  Administered 2017-06-19 – 2017-06-23 (×12): 500 mg via ORAL
  Filled 2017-06-19 (×12): qty 1

## 2017-06-19 MED ORDER — HALOPERIDOL 5 MG PO TABS
10.0000 mg | ORAL_TABLET | Freq: Every day | ORAL | Status: DC
  Administered 2017-06-19 – 2017-06-25 (×7): 10 mg via ORAL
  Filled 2017-06-19 (×8): qty 2

## 2017-06-19 NOTE — Progress Notes (Signed)
D: Pt +ve SI/ AH- non command, contracts for safety  denies HI/VH. Pt is pleasant and cooperative. Pt isolates to his room this evening, pt appears guarded. Pt stated he felt his medications were making him feel sleepy all the time. "I'm drugged up". Pt presents irritable and not forthcoming to writer this evening.   A: Pt was offered support and encouragement. Pt was given scheduled medications. Pt was encourage to attend groups. Q 15 minute checks were done for safety.   R: safety maintained on unit.

## 2017-06-19 NOTE — H&P (Addendum)
Psychiatric Admission Assessment Adult  Patient Identification: Benjamin Braun MRN:  [id number] Date of Evaluation:  06/19/2017 Chief Complaint:  Schizoophrena Principal Diagnosis: Undifferentiated schizophrenia ([place]) Diagnosis:   Patient Active Problem List   Diagnosis Date Noted  . Undifferentiated schizophrenia ([place]) [F20.3] 11/04/2016    Priority: High  . Noncompliance with treatment [Z91.19] 04/14/2015   History of Present Illness:   Mr. Bonano is a 52 year old male with a history of schizophrenia.  Chief complaint.   History of present illness. Information was obtained from the patient and the chart. The patient was brought to the hospital after he called the police to complain about his neighbors. Apparently, the patient behaved strangely complained of chemical smells and thought there was mischief in the neighborhood. The patient adamantly denies any symptoms of depression, anxiety or depression. His speech is pressured and loud and he is paranoid. He has not been taking his medications at all. He denies substance use.  Past psychiatric history. He responds well to Zyprexa. He was hospitalized here in the spring of 2018 and discharged on a combination of Depakote, Zyprexa and Haldol decanoate injection. He usually declines injectables for "religious reasons". He denies ever attempting suicide.  Family psychiatric history. None reported.  Social history. He is disabled from mental illness. He lives independently in an apartment.   Total Time spent with patient: 1 hour  Is the patient at risk to self? No.  Has the patient been a risk to self in the past 6 months? No.  Has the patient been a risk to self within the distant past? No.  Is the patient a risk to others? No.  Has the patient been a risk to others in the past 6 months? No.  Has the patient been a risk to others within the distant past? No.   Prior Inpatient Therapy:   Prior Outpatient Therapy:    Alcohol  Screening: 1. How often do you have a drink containing alcohol?: Never 2. How many drinks containing alcohol do you have on a typical day when you are drinking?: 1 or 2 3. How often do you have six or more drinks on one occasion?: Never AUDIT-C Score: 0 4. How often during the last year have you found that you were not able to stop drinking once you had started?: Never 5. How often during the last year have you failed to do what was normally expected from you becasue of drinking?: Never 6. How often during the last year have you needed a first drink in the morning to get yourself going after a heavy drinking session?: Never 7. How often during the last year have you had a feeling of guilt of remorse after drinking?: Never 8. How often during the last year have you been unable to remember what happened the night before because you had been drinking?: Never 9. Have you or someone else been injured as a result of your drinking?: No 10. Has a relative or friend or a doctor or another health worker been concerned about your drinking or suggested you cut down?: No Alcohol Use Disorder Identification Test Final Score (AUDIT): 0 Intervention/Follow-up: AUDIT Score <7 follow-up not indicated Substance Abuse History in the last 12 months:  No. Consequences of Substance Abuse: NA Previous Psychotropic Medications: Yes  Psychological Evaluations: No  Past Medical History:  Past Medical History:  Diagnosis Date  . Anxiety   . Generalized anxiety disorder   . Schizophrenia [facility])     Past Surgical History:  Procedure Laterality Date  . TONSILLECTOMY     Family History:  Family History  Family history unknown: Yes   Tobacco Screening: Have you used any form of tobacco in the last 30 days? (Cigarettes, Smokeless Tobacco, Cigars, and/or Pipes): No Social History:  Social History   Substance and Sexual Activity  Alcohol Use No  . Alcohol/week: 0.0 oz     Social History   Substance and Sexual  Activity  Drug Use No    Additional Social History:      History of alcohol / drug use?: No history of alcohol / drug abuse                    Allergies:  No Known Allergies Lab Results:  Results for orders placed or performed during the hospital encounter of 06/18/17 (from the past 48 hour(s))  Hemoglobin A1c     Status: None   Collection Time: 06/19/17  7:17 AM  Result Value Ref Range   Hgb A1c MFr Bld 5.4 4.8 - 5.6 %    Comment: (NOTE) Pre diabetes:          5.7%-6.4% Diabetes:              >6.4% Glycemic control for   <7.0% adults with diabetes    Mean Plasma Glucose 108.28 mg/dL    Comment: Performed at [facility], [place] [address] [place], [place] [address]  Lipid panel     Status: Abnormal   Collection Time: 06/19/17  7:17 AM  Result Value Ref Range   Cholesterol 240 (H) 0 - 200 mg/dL   Triglycerides 290 (H) <150 mg/dL   HDL 36 (L) >40 mg/dL   Total CHOL/HDL Ratio 6.7 RATIO   VLDL 58 (H) 0 - 40 mg/dL   LDL Cholesterol 146 (H) 0 - 99 mg/dL    Comment:        Total Cholesterol/HDL:CHD Risk Coronary Heart Disease Risk Table                     Men   Women  1/2 Average Risk   3.4   3.3  Average Risk       5.0   4.4  2 X Average Risk   9.6   7.1  3 X Average Risk  23.4   11.0        Use the calculated Patient Ratio above and the CHD Risk Table to determine the patient's CHD Risk.        ATP III CLASSIFICATION (LDL):  <100     mg/dL   Optimal  100-129  mg/dL   Near or Above                    Optimal  130-159  mg/dL   Borderline  160-189  mg/dL   High  >190     mg/dL   Very High Performed at [facility], [address] [place], [place] [address]   TSH     Status: None   Collection Time: 06/19/17  7:17 AM  Result Value Ref Range   TSH 1.830 0.350 - 4.500 uIU/mL    Comment: Performed by a 3rd Generation assay with a functional sensitivity of <=0.01 uIU/mL. Performed at [facility], [address] [place],   [address]     Blood Alcohol level:  Lab Results  Component Value Date   [facility] <10 06/18/2017   ETH <5 11/03/2016  Metabolic Disorder Labs:  Lab Results  Component Value Date   HGBA1C 5.4 06/19/2017   MPG 108.28 06/19/2017   MPG 111 11/05/2016   No results found for: PROLACTIN Lab Results  Component Value Date   CHOL 240 (H) 06/19/2017   TRIG 290 (H) 06/19/2017   HDL 36 (L) 06/19/2017   CHOLHDL 6.7 06/19/2017   VLDL 58 (H) 06/19/2017   LDLCALC 146 (H) 06/19/2017   LDLCALC 115 (H) 11/05/2016    Current Medications: Current Facility-Administered Medications  Medication Dose Route Frequency Provider Last Rate Last Dose  . acetaminophen (TYLENOL) tablet 650 mg  650 mg Oral Q6H PRN Clapacs, [name], MD      . alum & mag hydroxide-simeth (MAALOX/MYLANTA) 200-200-20 MG/5ML suspension 30 mL  30 mL Oral Q4H PRN Clapacs, [name], MD      . divalproex (DEPAKOTE) DR tablet 500 mg  500 mg Oral Q8H Vitoria Conyer B, MD   500 mg at 06/19/17 1623  . haloperidol (HALDOL) tablet 10 mg  10 mg Oral QHS Takyah Ciaramitaro B, MD      . hydrOXYzine (ATARAX/VISTARIL) tablet 25 mg  25 mg Oral TID PRN Clapacs, [name], MD   25 mg at 06/18/17 2221  . magnesium hydroxide (MILK OF MAGNESIA) suspension 30 mL  30 mL Oral Daily PRN Clapacs, [name], MD      . OLANZapine (ZYPREXA) tablet 10 mg  10 mg Oral BID AC & HS Coulton Schlink B, MD   10 mg at 06/19/17 1032  . traZODone (DESYREL) tablet 100 mg  100 mg Oral QHS PRN Clapacs, [name], MD   100 mg at 06/18/17 2221   PTA Medications: Medications Prior to Admission  Medication Sig Dispense Refill Last Dose  . cyclobenzaprine (FLEXERIL) 10 MG tablet Take 1 tablet (10 mg total) by mouth every 8 (eight) hours as needed (for back/neck pain). (Patient not taking: Reported on 06/18/2017) 90 tablet 1 Not Taking at Unknown time  . divalproex (DEPAKOTE) 250 MG DR tablet Take 3 tablets (750 mg total) by mouth every 12 (twelve) hours. (Patient not taking: Reported  on 06/18/2017) 180 tablet 1 Not Taking at Unknown time  . haloperidol decanoate (HALDOL DECANOATE) 100 MG/ML injection Inject 1 mL (100 mg total) into the muscle every 30 (thirty) days. Next injection on 12/09/2016. (Patient not taking: Reported on 06/18/2017) 1 mL 1 Not Taking at Unknown time  . hydrOXYzine (ATARAX/VISTARIL) 50 MG tablet Take 1 tablet (50 mg total) by mouth at bedtime. (Patient not taking: Reported on 06/18/2017) 90 tablet 1 Not Taking at Unknown time  . OLANZapine (ZYPREXA) 15 MG tablet Take 1 tablet (15 mg total) by mouth at bedtime. (Patient not taking: Reported on 06/18/2017) 30 tablet 1 Not Taking at Unknown time  . traZODone (DESYREL) 100 MG tablet Take 1 tablet (100 mg total) by mouth at bedtime. (Patient not taking: Reported on 06/18/2017) 30 tablet 1 Not Taking at Unknown time    Musculoskeletal: Strength & Muscle Tone: within normal limits Gait & Station: normal Patient leans: N/A  Psychiatric Specialty Exam: Physical Exam  Nursing note and vitals reviewed. Psychiatric: His affect is labile. His speech is rapid and/or pressured and slurred. He is actively hallucinating. Thought content is paranoid and delusional. Cognition and memory are normal. He expresses impulsivity.    Review of Systems  Neurological: Negative.   Psychiatric/Behavioral: Positive for hallucinations.  All other systems reviewed and are negative.   Blood pressure 120/78, pulse (!) 107, temperature  4 F (36.7 C), temperature source Oral, resp. rate 18, height 5\' 6"  (1.676 m), weight 91.6 kg (202 lb), SpO2 99 %.Body mass index is 32.6 kg/m.  See SRA                                                  Sleep:  Number of Hours: 6.75    Treatment Plan Summary: Daily contact with patient to assess and evaluate symptoms and progress in treatment and Medication management   Mr. Foot is a 52 year old male with a history of schizophrenia admitted floridly psychotic in the context of  medication noncompliance.  #Psychosis -start Zyprexa 10 mg BID -start Haldol 10 mg nightly -offer Haldol decanoate  -start Depakote 500 mg TID -offer Trazodone 600 mg PRN  #Metabilic syndrome monitoring -Lipid panel, TSH, HgbA1C are pending -EKG, pending  #Disposition -discharge to his apartment -follow up with [facility]   Observation Level/Precautions:  15 minute checks  Laboratory:  CBC Chemistry Profile UDS UA  Psychotherapy:    Medications:    Consultations:    Discharge Concerns:    Estimated LOS:  Other:     Physician Treatment Plan for Primary Diagnosis: Undifferentiated schizophrenia ([place]) Long Term Goal(s): Improvement in symptoms so as ready for discharge  Short Term Goals: Ability to identify changes in lifestyle to reduce recurrence of condition will improve, Ability to verbalize feelings will improve, Ability to disclose and discuss suicidal ideas, Ability to demonstrate self-control will improve, Ability to identify and develop effective coping behaviors will improve, Ability to maintain clinical measurements within normal limits will improve, Compliance with prescribed medications will improve and Ability to identify triggers associated with substance abuse/mental health issues will improve  Physician Treatment Plan for Secondary Diagnosis: Principal Problem:   Undifferentiated schizophrenia ([place]) Active Problems:   Noncompliance with treatment  Long Term Goal(s): NA  Short Term Goals: NA  I certify that inpatient services furnished can reasonably be expected to improve the patient's condition.    Orson Slick, MD 1/10/20196:20 PM

## 2017-06-19 NOTE — B[facility] Group Notes (Signed)
  06/19/2017  Time: 0900  Type of Therapy and Topic:  Group Therapy:  Setting Goals Participation Level:  Did Not Attend  Description of Group: In this process group, patients discussed using strengths to work toward goals and address challenges.  Patients identified two positive things about themselves and one goal they were working on.  Patients were given the opportunity to share openly and support each other's plan for self-empowerment.  The group discussed the value of gratitude and were encouraged to have a daily reflection of positive characteristics or circumstances.  Patients were encouraged to identify a plan to utilize their strengths to work on current challenges and goals.  Therapeutic Goals 1. Patient will verbalize personal strengths/positive qualities and relate how these can assist with achieving desired personal goals 2. Patients will verbalize affirmation of peers plans for personal change and goal setting 3. Patients will explore the value of gratitude and positive focus as related to successful achievement of goals 4. Patients will verbalize a plan for regular reinforcement of personal positive qualities and circumstances.  Summary of Patient Progress: Pt was invited to attend group but chose not to attend. CSW will continue to encourage pt to attend group throughout their admission.    Therapeutic Modalities Cognitive Behavioral Therapy Motivational Interviewing   Glena Pharris, MSW, LCSW 06/19/2017 9:26 AM  

## 2017-06-19 NOTE — Plan of Care (Signed)
  Coping: Ability to demonstrate self-control will improve 06/19/2017 2055 - Progressing by Providence Crosby, RN Note Pt has been calm on the unit this evening   Safety: Periods of time without injury will increase 06/19/2017 2055 - Progressing by Providence Crosby, RN Note  Safe at this time   Coping: Ability to verbalize feelings will improve 06/19/2017 2055 - Progressing by Providence Crosby, RN Note Pt felt his medications were making him sleepy

## 2017-06-19 NOTE — Progress Notes (Signed)
D: Patient stated slept good last night .Stated appetite is good and energy level  Is normal, auditory hallucinations   No pain concerns . Appropriate ADL'S.  Limited Interacting with peers and staff.  Patient had  Brother to pick up house keys  To get his dog out . Patient  Remains Delusional and paranoid . Periods of long sleep during  Shift. un arouse   A: Encourage patient participation with unit programming . Instruction  Given on  Medication , verbalize understanding.  R: Voice no other concerns. Staff continue to monitor

## 2017-06-19 NOTE — BHH Group Notes (Signed)
[employer] Group Notes:  (Nursing/MHT/Case Management/Adjunct)  Date:  06/19/2017  Time:  5:33 PM  Type of Therapy:  Psychoeducational Skills  Participation Level:  Did Not Attend   Alois Cliche 06/19/2017, 5:33 PM

## 2017-06-19 NOTE — BHH Group Notes (Signed)
06/19/2017  Time: 1:00PM  Type of Therapy/Topic:  Group Therapy:  Balance in Life  Participation Level:  Did Not Attend  Description of Group:   This group will address the concept of balance and how it feels and looks when one is unbalanced. Patients will be encouraged to process areas in their lives that are out of balance and identify reasons for remaining unbalanced. Facilitators will guide patients in utilizing problem-solving interventions to address and correct the stressor making their life unbalanced. Understanding and applying boundaries will be explored and addressed for obtaining and maintaining a balanced life. Patients will be encouraged to explore ways to assertively make their unbalanced needs known to significant others in their lives, using other group members and facilitator for support and feedback.  Therapeutic Goals: 1. Patient will identify two or more emotions or situations they have that consume much of in their lives. 2. Patient will identify signs/triggers that life has become out of balance:  3. Patient will identify two ways to set boundaries in order to achieve balance in their lives:  4. Patient will demonstrate ability to communicate their needs through discussion and/or role plays  Summary of Patient Progress: Pt was invited to attend group but chose not to attend. CSW will continue to encourage pt to attend group throughout their admission.    Therapeutic Modalities:   Cognitive Behavioral Therapy Solution-Focused Therapy Assertiveness Training  Alden Hipp, MSW, LCSW 06/19/2017 1:34 PM

## 2017-06-19 NOTE — BHH Suicide Risk Assessment (Addendum)
[facility] Admission Suicide Risk Assessment   Nursing information obtained from:  Patient Demographic factors:  Male, Caucasian, Low socioeconomic status, Living alone, Unemployed Current Mental Status:  NA Loss Factors:  Legal issues Historical Factors:  NA Risk Reduction Factors:  Religious beliefs about death  Total Time spent with patient: 1 hour Principal Problem: Undifferentiated schizophrenia ([place]) Diagnosis:   Patient Active Problem List   Diagnosis Date Noted  . Undifferentiated schizophrenia ([place]) [F20.3] 11/04/2016    Priority: High  . Noncompliance with treatment [Z91.19] 04/14/2015   Subjective Data: psychotic break  Continued Clinical Symptoms:  Alcohol Use Disorder Identification Test Final Score (AUDIT): 0 The "Alcohol Use Disorders Identification Test", Guidelines for Use in Primary Care, Second Edition.  World Pharmacologist [facility]). Score between 0-7:  no or low risk or alcohol related problems. Score between 8-15:  moderate risk of alcohol related problems. Score between 16-19:  high risk of alcohol related problems. Score 20 or above:  warrants further diagnostic evaluation for alcohol dependence and treatment.   CLINICAL FACTORS:   Schizophrenia:   Paranoid or undifferentiated type   Musculoskeletal: Strength & Muscle Tone: within normal limits Gait & Station: normal Patient leans: N/A  Psychiatric Specialty Exam: Physical Exam  Nursing note and vitals reviewed. Psychiatric: His affect is blunt. His speech is delayed. He is slowed, withdrawn and actively hallucinating. Thought content is paranoid and delusional. Cognition and memory are normal. He expresses impulsivity.    Review of Systems  Neurological: Negative.   Psychiatric/Behavioral: Positive for hallucinations. The patient has insomnia.   All other systems reviewed and are negative.   Blood pressure 120/78, pulse (!) 107, temperature 98 F (36.7 C), temperature source Oral, resp. rate 18,  height 5\' 6"  (1.676 m), weight 91.6 kg (202 lb), SpO2 99 %.Body mass index is 32.6 kg/m.  General Appearance: Disheveled  Eye Contact:  Minimal  Speech:  Pressured  Volume:  Increased  Mood:  Angry, Dysphoric and Irritable  Affect:  Congruent  Thought Process:  Disorganized and Descriptions of Associations: Tangential  Orientation:  Full (Time, Place, and Person)  Thought Content:  Delusions, Hallucinations: Auditory and Paranoid Ideation  Suicidal Thoughts:  No  Homicidal Thoughts:  No  Memory:  Immediate;   Fair Recent;   Fair Remote;   Fair  Judgement:  Poor  Insight:  Lacking  Psychomotor Activity:  Increased  Concentration:  Concentration: Fair and Attention Span: Fair  Recall:  [employer] of Knowledge:  Fair  Language:  Fair  Akathisia:  No  Handed:  Right  AIMS (if indicated):     Assets:  Communication Skills Desire for Improvement Financial Resources/Insurance Housing Physical Health Resilience  ADL's:  Intact  Cognition:  WNL  Sleep:  Number of Hours: 6.75      COGNITIVE FEATURES THAT CONTRIBUTE TO RISK:  None    SUICIDE RISK:   Mild:  Suicidal ideation of limited frequency, intensity, duration, and specificity.  There are no identifiable plans, no associated intent, mild dysphoria and related symptoms, good self-control (both objective and subjective assessment), few other risk factors, and identifiable protective factors, including available and accessible social support.  PLAN OF CARE: hospital admission, medication management, discharge planning.  Benjamin Braun is a 52 year old male with a history of schizophrenia admitted floridly psychotic in the context of medication noncompliance.  #Psychosis -start Zyprexa 10 mg BID -start Haldol 10 mg nightly -offer Haldol decanoate  -start Depakote 500 mg TID -offer Trazodone 169 mg PRN  #Metabilic syndrome  monitoring -Lipid panel, TSH, HgbA1C are pending -EKG, pending  #Disposition -discharge to his  apartment -follow up with [facility]    I certify that inpatient services furnished can reasonably be expected to improve the patient's condition.   Orson Slick, MD 06/19/2017, 6:20 PM

## 2017-06-19 NOTE — Progress Notes (Signed)
Recreation Therapy Notes  Date: 01.10.2019  Time: 9:30 am  Location: Craft Room  Behavioral response: N/A  Intervention Topic: Stress  Discussion/Intervention: Patient did not attend group. Clinical Observations/Feedback:  Patient did not attend group. Pierra Skora LRT/CTRS         Joani Cosma 06/19/2017 10:39 AM

## 2017-06-19 NOTE — Progress Notes (Signed)
Recreation Therapy Notes  INPATIENT RECREATION THERAPY ASSESSMENT  Patient Details Name: Benjamin Braun MRN: [id number] DOB: 28-Jun-1965 Today's Date: 06/19/2017  Patient declined assessment.  Patient Stressors:    Coping Skills:      Horticulturist, commercial:    Leisure Interests (2+):     Awareness of Community Resources:     [employer]:     Current Use:    If no, Barriers?:    Patient Strengths:     Patient Identified Areas of Improvement:     Current Recreation Participation:     Patient Goal for Hospitalization:     [place] of Residence:     [place] of Residence:      Current SI (including self-harm):     Current HI:     Consent to Intern Participation:     [name] 06/19/2017, 12:34 PM

## 2017-06-19 NOTE — Plan of Care (Signed)
New admission , redirected on [facility] Information , patient has a understanding of  programing  on the unit  No safety  Concerns .

## 2017-06-20 NOTE — Progress Notes (Signed)
Patient ID: Benjamin Braun, male   DOB: Dec 15, 1965, 52 y.o.   MRN: [id number] CSW attempted twice to meet with pt to complete PSA and gather consents.  CSW was not able to wake pt up neither time.  Pt was not able to be aroused at all with CSW calling his name several times and gently tapping him.

## 2017-06-20 NOTE — Progress Notes (Signed)
D- Patient alert and oriented. Patient presents in an irritable/agitated mood on assessment stating that "everytime I get sleep somebody's coming in there waking me up, y'all  Are giving me too much medication". Patient states "you're only supposed to take Zyprexa 1 mg at a time and y'all are giving me nine at a time". Patient denies SI, HI, AVH, and pain at this time. Patient reports that hs goal for today is "going home", which he will meet by "take meds".   A- Scheduled medications administered to patient, per MD orders. Support and encouragement provided.  Routine safety checks conducted every 15 minutes.  Patient informed to notify staff with problems or concerns.   R- No adverse drug reactions noted. Patient contracts for safety at this time. Patient compliant with medications and treatment plan. Patient receptive, calm, and cooperative. Patient interacts well with others on the unit.  Patient remains safe at this time.

## 2017-06-20 NOTE — Plan of Care (Signed)
  Progressing Education: Knowledge of [facility] Education information/materials will improve 06/20/2017 2012 - Progressing by Alyson Locket I, RN Safety: Periods of time without injury will increase 06/20/2017 2012 - Progressing by Anson [place], RN

## 2017-06-20 NOTE — Progress Notes (Signed)
Recreation Therapy Notes  Date: 01.11.2019  Time: 9:30 am  Location: Craft Room  Behavioral response: N/A  Intervention Topic: Leisure  Discussion/Intervention: Patient did not attend group. Clinical Observations/Feedback:  Patient did not attend group. Benjamin Braun LRT/CTRS         Gerard Cantara 06/20/2017 10:26 AM

## 2017-06-20 NOTE — B[facility] Group Notes (Signed)
  06/20/2017  Time: 1:00PM  Type of Therapy and Topic:  Group Therapy:  Feelings around Relapse and Recovery  Participation Level:  Did Not Attend   Description of Group:    Patients in this group will discuss emotions they experience before and after a relapse. They will process how experiencing these feelings, or avoidance of experiencing them, relates to having a relapse. Facilitator will guide patients to explore emotions they have related to recovery. Patients will be encouraged to process which emotions are more powerful. They will be guided to discuss the emotional reaction significant others in their lives may have to their relapse or recovery. Patients will be assisted in exploring ways to respond to the emotions of others without this contributing to a relapse.  Therapeutic Goals: 1. Patient will identify two or more emotions that lead to a relapse for them 2. Patient will identify two emotions that result when they relapse 3. Patient will identify two emotions related to recovery 4. Patient will demonstrate ability to communicate their needs through discussion and/or role plays   Summary of Patient Progress: Pt was invited to attend group but chose not to attend. CSW will continue to encourage pt to attend group throughout their admission.   Therapeutic Modalities:   Cognitive Behavioral Therapy Solution-Focused Therapy Assertiveness Training Relapse Prevention Therapy  Alden Hipp, MSW, LCSW 06/20/2017 1:37 PM

## 2017-06-20 NOTE — Progress Notes (Signed)
[facility] MD Progress Note  06/20/2017 12:46 PM Benjamin Braun  MRN:  [id number]  Subjective:  Benjamin Braun is short of hearing. He is up and about during breakfast and lunchtime but asleep most of the time. He denies any problems but mumbles under his breath and has pressured speech. He feels he needs to go home to take care of his fis "some of them are 54 years old". This has been always his worry when he returns to the hospital. There may be a family member who saved the fish during last hospitalization. Sleeps well at night. Appetite is good. Accepts medication in spite of feeling sedated.  Treatment plan. We restarted Haldol and Zyprexa for psychosis and depakote for mood stabilization. VPA level on Monday. We will try to give Haldol decanoate injection which he usually refuses "on religious grounds".  Social/disposition. Discharge to his apartment. Follow up with ACT team.  Principal Problem: Undifferentiated schizophrenia ([place]) Diagnosis:   Patient Active Problem List   Diagnosis Date Noted  . Undifferentiated schizophrenia ([place]) [F20.3] 11/04/2016    Priority: High  . Noncompliance with treatment [Z91.19] 04/14/2015   Total Time spent with patient: 20 minutes  Past Psychiatric History: schizophrenia  Past Medical History:  Past Medical History:  Diagnosis Date  . Anxiety   . Generalized anxiety disorder   . Schizophrenia [facility])     Past Surgical History:  Procedure Laterality Date  . TONSILLECTOMY     Family History:  Family History  Family history unknown: Yes   Family Psychiatric  History: none reported Social History:  Social History   Substance and Sexual Activity  Alcohol Use No  . Alcohol/week: 0.0 oz     Social History   Substance and Sexual Activity  Drug Use No    Social History   Socioeconomic History  . Marital status: Single    Spouse name: None  . Number of children: None  . Years of education: None  . Highest education level: None  Social Needs   . Financial resource strain: None  . Food insecurity - worry: None  . Food insecurity - inability: None  . Transportation needs - medical: None  . Transportation needs - non-medical: None  Occupational History  . None  Tobacco Use  . Smoking status: Former Smoker    Types: Cigarettes    Last attempt to quit: 06/11/1999    Years since quitting: 18.0  . Smokeless tobacco: Never Used  Substance and Sexual Activity  . Alcohol use: No    Alcohol/week: 0.0 oz  . Drug use: No  . Sexual activity: No  Other Topics Concern  . None  Social History Narrative  . None   Additional Social History:    History of alcohol / drug use?: No history of alcohol / drug abuse                    Sleep: Fair  Appetite:  Fair  Current Medications: Current Facility-Administered Medications  Medication Dose Route Frequency Provider Last Rate Last Dose  . acetaminophen (TYLENOL) tablet 650 mg  650 mg Oral Q6H PRN Clapacs, [name], MD      . alum & mag hydroxide-simeth (MAALOX/MYLANTA) 200-200-20 MG/5ML suspension 30 mL  30 mL Oral Q4H PRN Clapacs, [name], MD      . divalproex (DEPAKOTE) DR tablet 500 mg  500 mg Oral Q8H Keino Placencia B, MD   500 mg at 06/20/17 0617  . haloperidol (HALDOL) tablet 10  mg  10 mg Oral QHS Shauntee Karp B, MD   10 mg at 06/19/17 2316  . hydrOXYzine (ATARAX/VISTARIL) tablet 25 mg  25 mg Oral TID PRN Clapacs, [name], MD   25 mg at 06/18/17 2221  . magnesium hydroxide (MILK OF MAGNESIA) suspension 30 mL  30 mL Oral Daily PRN Clapacs, [name], MD      . OLANZapine (ZYPREXA) tablet 10 mg  10 mg Oral BID AC & HS Harith Mccadden B, MD   10 mg at 06/20/17 0847  . traZODone (DESYREL) tablet 100 mg  100 mg Oral QHS PRN Clapacs, [name], MD   100 mg at 06/18/17 2221    Lab Results:  Results for orders placed or performed during the hospital encounter of 06/18/17 (from the past 48 hour(s))  Hemoglobin A1c     Status: None   Collection Time: 06/19/17  7:17 AM   Result Value Ref Range   Hgb A1c MFr Bld 5.4 4.8 - 5.6 %    Comment: (NOTE) Pre diabetes:          5.7%-6.4% Diabetes:              >6.4% Glycemic control for   <7.0% adults with diabetes    Mean Plasma Glucose 108.28 mg/dL    Comment: Performed at [facility], [place] [address], [place], [place] [address]  Lipid panel     Status: Abnormal   Collection Time: 06/19/17  7:17 AM  Result Value Ref Range   Cholesterol 240 (H) 0 - 200 mg/dL   Triglycerides 290 (H) <150 mg/dL   HDL 36 (L) >40 mg/dL   Total CHOL/HDL Ratio 6.7 RATIO   VLDL 58 (H) 0 - 40 mg/dL   LDL Cholesterol 146 (H) 0 - 99 mg/dL    Comment:        Total Cholesterol/HDL:CHD Risk Coronary Heart Disease Risk Table                     Men   Women  1/2 Average Risk   3.4   3.3  Average Risk       5.0   4.4  2 X Average Risk   9.6   7.1  3 X Average Risk  23.4   11.0        Use the calculated Patient Ratio above and the CHD Risk Table to determine the patient's CHD Risk.        ATP III CLASSIFICATION (LDL):  <100     mg/dL   Optimal  100-129  mg/dL   Near or Above                    Optimal  130-159  mg/dL   Borderline  160-189  mg/dL   High  >190     mg/dL   Very High Performed at [facility], [address] [place], [place] [address]   TSH     Status: None   Collection Time: 06/19/17  7:17 AM  Result Value Ref Range   TSH 1.830 0.350 - 4.500 uIU/mL    Comment: Performed by a 3rd Generation assay with a functional sensitivity of <=0.01 uIU/mL. Performed at [facility], [address] [place], [place] [address]     Blood Alcohol level:  Lab Results  Component Value Date   [facility] <10 06/18/2017   ETH <5 73/42/8768    Metabolic Disorder Labs: Lab Results  Component  Value Date   HGBA1C 5.4 06/19/2017   MPG 108.28 06/19/2017   MPG 111 11/05/2016   No results found for: PROLACTIN Lab Results  Component Value Date   CHOL 240 (H) 06/19/2017   TRIG 290 (H) 06/19/2017   HDL 36  (L) 06/19/2017   CHOLHDL 6.7 06/19/2017   VLDL 58 (H) 06/19/2017   LDLCALC 146 (H) 06/19/2017   LDLCALC 115 (H) 11/05/2016    Physical Findings: AIMS:  , ,  ,  ,    CIWA:    COWS:     Musculoskeletal: Strength & Muscle Tone: within normal limits Gait & Station: normal Patient leans: N/A  Psychiatric Specialty Exam: Physical Exam  Nursing note and vitals reviewed. Psychiatric: His affect is blunt and labile. His speech is slurred. He is withdrawn. Thought content is paranoid and delusional. Cognition and memory are normal. He expresses impulsivity.    Review of Systems  HENT: Positive for hearing loss.   Neurological: Negative.   Psychiatric/Behavioral: Positive for hallucinations.  All other systems reviewed and are negative.   Blood pressure 118/89, pulse (!) 114, temperature 98 F (36.7 C), temperature source Oral, resp. rate 18, height 5\' 6"  (1.676 m), weight 91.6 kg (202 lb), SpO2 99 %.Body mass index is 32.6 kg/m.  General Appearance: Fairly Groomed  Eye Contact:  Minimal  Speech:  Pressured  Volume:  Increased  Mood:  Dysphoric and Irritable  Affect:  Congruent  Thought Process:  Goal Directed and Descriptions of Associations: Intact  Orientation:  Full (Time, Place, and Person)  Thought Content:  Delusions, Hallucinations: Auditory and Paranoid Ideation  Suicidal Thoughts:  No  Homicidal Thoughts:  No  Memory:  Immediate;   Fair Recent;   Fair Remote;   Fair  Judgement:  Poor  Insight:  Lacking  Psychomotor Activity:  Decreased  Concentration:  Concentration: Fair and Attention Span: Fair  Recall:  [employer] of Knowledge:  Fair  Language:  Fair  Akathisia:  No  Handed:  Right  AIMS (if indicated):     Assets:  Communication Skills Desire for Improvement Financial Resources/Insurance Housing Physical Health Resilience  ADL's:  Intact  Cognition:  WNL  Sleep:  Number of Hours: 6.3     Treatment Plan Summary: Daily contact with patient to  assess and evaluate symptoms and progress in treatment and Medication management   Mr. Burnham is a 52 year old male with a history of schizophrenia admitted floridly psychotic in the context of medication noncompliance.  #Psychosis -start Zyprexa 10 mg BID -start Haldol 10 mg nightly -offer Haldol decanoate  -start Depakote 500 mg TID, level on 06/23/2017 -offer Trazodone 696 mg PRN  #Metabilic syndrome monitoring -Lipid panel shows elevated cholesterol and TG, normal TSH and HgbA1C -EKG, QTc 435  #Disposition -discharge to his apartment -follow up with ACT team     Orson Slick, MD 06/20/2017, 12:46 PM

## 2017-06-20 NOTE — Plan of Care (Signed)
Patient reports that he slept well last night, but states that "every time I get to sleep y'all are coming in here waking me up". Patient verbalizes understanding of the general information presented to him without any other questions or concerns at this time. Patient denies ant SI/HI/AVH at this time. Patient has the ability to verbalize his frustrations/anger in an appropriate manner as well as demonstrates self-control on the unit. Patient has not identified to this Probation officer any available resources to help assist him in meeting his health care needs. Patient has been free from injury at this time. Patient has been in compliance with prescribed medication regimen thus far. Patient is safe on the unit.

## 2017-06-20 NOTE — Tx Team (Signed)
Interdisciplinary Treatment and Diagnostic Plan Update  06/20/2017 Time of Session: 11:50 AM RICHRD KUZNIAR MRN: [id number]  Principal Diagnosis: Undifferentiated schizophrenia [facility])  Secondary Diagnoses: Principal Problem:   Undifferentiated schizophrenia ([place]) Active Problems:   Noncompliance with treatment   Current Medications:  Current Facility-Administered Medications  Medication Dose Route Frequency Provider Last Rate Last Dose  . acetaminophen (TYLENOL) tablet 650 mg  650 mg Oral Q6H PRN Clapacs, [name], MD      . alum & mag hydroxide-simeth (MAALOX/MYLANTA) 200-200-20 MG/5ML suspension 30 mL  30 mL Oral Q4H PRN Clapacs, [name], MD      . divalproex (DEPAKOTE) DR tablet 500 mg  500 mg Oral Q8H Pucilowska, Jolanta B, MD   500 mg at 06/20/17 0617  . haloperidol (HALDOL) tablet 10 mg  10 mg Oral QHS Pucilowska, Jolanta B, MD   10 mg at 06/19/17 2316  . hydrOXYzine (ATARAX/VISTARIL) tablet 25 mg  25 mg Oral TID PRN Clapacs, [name], MD   25 mg at 06/18/17 2221  . magnesium hydroxide (MILK OF MAGNESIA) suspension 30 mL  30 mL Oral Daily PRN Clapacs, [name], MD      . OLANZapine (ZYPREXA) tablet 10 mg  10 mg Oral BID AC & HS Pucilowska, Jolanta B, MD   10 mg at 06/20/17 0847  . traZODone (DESYREL) tablet 100 mg  100 mg Oral QHS PRN Clapacs, [name], MD   100 mg at 06/18/17 2221   PTA Medications: Medications Prior to Admission  Medication Sig Dispense Refill Last Dose  . cyclobenzaprine (FLEXERIL) 10 MG tablet Take 1 tablet (10 mg total) by mouth every 8 (eight) hours as needed (for back/neck pain). (Patient not taking: Reported on 06/18/2017) 90 tablet 1 Not Taking at Unknown time  . divalproex (DEPAKOTE) 250 MG DR tablet Take 3 tablets (750 mg total) by mouth every 12 (twelve) hours. (Patient not taking: Reported on 06/18/2017) 180 tablet 1 Not Taking at Unknown time  . haloperidol decanoate (HALDOL DECANOATE) 100 MG/ML injection Inject 1 mL (100 mg total) into the muscle every 30  (thirty) days. Next injection on 12/09/2016. (Patient not taking: Reported on 06/18/2017) 1 mL 1 Not Taking at Unknown time  . hydrOXYzine (ATARAX/VISTARIL) 50 MG tablet Take 1 tablet (50 mg total) by mouth at bedtime. (Patient not taking: Reported on 06/18/2017) 90 tablet 1 Not Taking at Unknown time  . OLANZapine (ZYPREXA) 15 MG tablet Take 1 tablet (15 mg total) by mouth at bedtime. (Patient not taking: Reported on 06/18/2017) 30 tablet 1 Not Taking at Unknown time  . traZODone (DESYREL) 100 MG tablet Take 1 tablet (100 mg total) by mouth at bedtime. (Patient not taking: Reported on 06/18/2017) 30 tablet 1 Not Taking at Unknown time    Patient Stressors: Legal issue Other: Paranoia regarding neighbors and police  Patient Strengths: Active sense of humor Average or above average intelligence Capable of independent living Communication skills  Treatment Modalities: Medication Management, Group therapy, Case management,  1 to 1 session with clinician, Psychoeducation, Recreational therapy.   Physician Treatment Plan for Primary Diagnosis: Undifferentiated schizophrenia ([place]) Long Term Goal(s): Improvement in symptoms so as ready for discharge NA   Short Term Goals: Ability to identify changes in lifestyle to reduce recurrence of condition will improve Ability to verbalize feelings will improve Ability to disclose and discuss suicidal ideas Ability to demonstrate self-control will improve Ability to identify and develop effective coping behaviors will improve Ability to maintain clinical measurements within normal limits  will improve Compliance with prescribed medications will improve Ability to identify triggers associated with substance abuse/mental health issues will improve NA  Medication Management: Evaluate patient's response, side effects, and tolerance of medication regimen.  Therapeutic Interventions: 1 to 1 sessions, Unit Group sessions and Medication administration.  Evaluation of  Outcomes: Not Met  Physician Treatment Plan for Secondary Diagnosis: Principal Problem:   Undifferentiated schizophrenia ([place]) Active Problems:   Noncompliance with treatment  Long Term Goal(s): Improvement in symptoms so as ready for discharge NA   Short Term Goals: Ability to identify changes in lifestyle to reduce recurrence of condition will improve Ability to verbalize feelings will improve Ability to disclose and discuss suicidal ideas Ability to demonstrate self-control will improve Ability to identify and develop effective coping behaviors will improve Ability to maintain clinical measurements within normal limits will improve Compliance with prescribed medications will improve Ability to identify triggers associated with substance abuse/mental health issues will improve NA     Medication Management: Evaluate patient's response, side effects, and tolerance of medication regimen.  Therapeutic Interventions: 1 to 1 sessions, Unit Group sessions and Medication administration.  Evaluation of Outcomes: Not Met   RN Treatment Plan for Primary Diagnosis: Undifferentiated schizophrenia ([place]) Long Term Goal(s): Knowledge of disease and therapeutic regimen to maintain health will improve  Short Term Goals: Ability to participate in decision making will improve, Ability to disclose and discuss suicidal ideas, Ability to identify and develop effective coping behaviors will improve and Compliance with prescribed medications will improve  Medication Management: RN will administer medications as ordered by provider, will assess and evaluate patient's response and provide education to patient for prescribed medication. RN will report any adverse and/or side effects to prescribing provider.  Therapeutic Interventions: 1 on 1 counseling sessions, Psychoeducation, Medication administration, Evaluate responses to treatment, Monitor vital signs and CBGs as ordered, Perform/monitor CIWA, COWS,  AIMS and Fall Risk screenings as ordered, Perform wound care treatments as ordered.  Evaluation of Outcomes: Not Met   LCSW Treatment Plan for Primary Diagnosis: Undifferentiated schizophrenia ([place]) Long Term Goal(s): Safe transition to appropriate next level of care at discharge, Engage patient in therapeutic group addressing interpersonal concerns.  Short Term Goals: Engage patient in aftercare planning with referrals and resources, Increase social support and Increase skills for wellness and recovery  Therapeutic Interventions: Assess for all discharge needs, 1 to 1 time with Social worker, Explore available resources and support systems, Assess for adequacy in community support network, Educate family and significant other(s) on suicide prevention, Complete Psychosocial Assessment, Interpersonal group therapy.  Evaluation of Outcomes: Not Met   Progress in Treatment: Attending groups: No. Participating in groups: No. Taking medication as prescribed: Yes. Toleration medication: Yes. Family/Significant other contact made: No, will contact:  CSW will contact when identified and consent is given Patient understands diagnosis: Yes. Discussing patient identified problems/goals with staff: Yes. Medical problems stabilized or resolved: Yes. Denies suicidal/homicidal ideation: Yes. Issues/concerns per patient self-inventory: No. Other: n/a  New problem(s) identified: No, Describe:  No new problems identified  New Short Term/Long Term Goal(s): Pt refused to attend initial tx team meeting.  Discharge Plan or Barriers: Tentative plan to return to his home with outpatient follow-up services  Reason for Continuation of Hospitalization: Anxiety Depression Medication stabilization  Estimated Length of Stay: 3-5 days  Recreational Therapy: Patient Stressors: N/A Patient Goal:  Patient will attend and participate in Recreation Therapy Group Sessions x 5 days.   Attendees: Patient:  06/20/2017 4:37 PM  Physician: Herma Ard  Pucilowska, MD 06/20/2017 4:37 PM  Nursing: Elige Radon, RN 06/20/2017 4:37 PM  RN Care Manager: 06/20/2017 4:37 PM  Social Worker: Derrek Gu, LCSW 06/20/2017 4:37 PM  Recreational Therapist: Roanna Epley, LRT 06/20/2017 4:37 PM  Other:  06/20/2017 4:37 PM  Other:  06/20/2017 4:37 PM  Other: 06/20/2017 4:37 PM    Scribe for Treatment Team: Devona Konig, LCSW 06/20/2017 4:37 PM

## 2017-06-21 NOTE — Progress Notes (Addendum)
[facility] MD Progress Note  06/21/2017 12:46 PM Benjamin Braun  MRN:  [id number]  Subjective:  Benjamin Braun is in bed sleeping.  He did not wake up for me despite me calling his name multiple times.  His nurse has also tried to get him up earlier in the morning but he always goes to back to sleep.  He is resting normally with good chest rise and fall.   He refused olanzapine overnight but took his other medications.   Also nursing has found his hearing aid. Will put in when he wakes up   Treatment plan. We restarted Haldol and Zyprexa for psychosis and depakote for mood stabilization. VPA level on Monday. We will try to give Haldol decanoate injection which he usually refuses "on religious grounds".  Social/disposition. Discharge to his apartment. Follow up with ACT team.  Principal Problem: Undifferentiated schizophrenia (Benjamin Braun) Diagnosis:   Patient Active Problem List   Diagnosis Date Noted  . Undifferentiated schizophrenia (Benjamin Braun) [F20.3] 11/04/2016  . Noncompliance with treatment [Z91.19] 04/14/2015   Total Time spent with patient: 15 minutes  Past Psychiatric History: schizophrenia  Past Medical History:  Past Medical History:  Diagnosis Date  . Anxiety   . Generalized anxiety disorder   . Schizophrenia [facility])     Past Surgical History:  Procedure Laterality Date  . TONSILLECTOMY     Family History:  Family History  Family history unknown: Yes   Family Psychiatric  History: none reported Social History:  Social History   Substance and Sexual Activity  Alcohol Use No  . Alcohol/week: 0.0 oz     Social History   Substance and Sexual Activity  Drug Use No    Social History   Socioeconomic History  . Marital status: Single    Spouse name: None  . Number of children: None  . Years of education: None  . Highest education level: None  Social Needs  . Financial resource strain: None  . Food insecurity - worry: None  . Food insecurity - inability: None  .  Transportation needs - medical: None  . Transportation needs - non-medical: None  Occupational History  . None  Tobacco Use  . Smoking status: Former Smoker    Types: Cigarettes    Last attempt to quit: 06/11/1999    Years since quitting: 18.0  . Smokeless tobacco: Never Used  Substance and Sexual Activity  . Alcohol use: No    Alcohol/week: 0.0 oz  . Drug use: No  . Sexual activity: No  Other Topics Concern  . None  Social History Narrative  . None   Additional Social History:    History of alcohol / drug use?: No history of alcohol / drug abuse                    Sleep: a lot  Appetite:  Fair  Current Medications: Current Facility-Administered Medications  Medication Dose Route Frequency Provider Last Rate Last Dose  . acetaminophen (TYLENOL) tablet 650 mg  650 mg Oral Q6H PRN Clapacs, [name], MD      . alum & mag hydroxide-simeth (MAALOX/MYLANTA) 200-200-20 MG/5ML suspension 30 mL  30 mL Oral Q4H PRN Clapacs, [name], MD      . divalproex (DEPAKOTE) DR tablet 500 mg  500 mg Oral Q8H Pucilowska, Jolanta B, MD   500 mg at 06/21/17 0642  . haloperidol (HALDOL) tablet 10 mg  10 mg Oral QHS Pucilowska, Jolanta B, MD   10 mg at  06/20/17 2111  . hydrOXYzine (ATARAX/VISTARIL) tablet 25 mg  25 mg Oral TID PRN Clapacs, [name], MD   25 mg at 06/18/17 2221  . magnesium hydroxide (MILK OF MAGNESIA) suspension 30 mL  30 mL Oral Daily PRN Clapacs, [name], MD      . OLANZapine (ZYPREXA) tablet 10 mg  10 mg Oral BID AC & HS Pucilowska, Jolanta B, MD   10 mg at 06/20/17 0847  . traZODone (DESYREL) tablet 100 mg  100 mg Oral QHS PRN Clapacs, [name], MD   100 mg at 06/18/17 2221    Lab Results:  No results found for this or any previous visit (from the past 48 hour(s)).  Blood Alcohol level:  Lab Results  Component Value Date   ETH <10 06/18/2017   ETH <5 36/62/9476    Metabolic Disorder Labs: Lab Results  Component Value Date   HGBA1C 5.4 06/19/2017   MPG 108.28  06/19/2017   MPG 111 11/05/2016   No results found for: PROLACTIN Lab Results  Component Value Date   CHOL 240 (H) 06/19/2017   TRIG 290 (H) 06/19/2017   HDL 36 (L) 06/19/2017   CHOLHDL 6.7 06/19/2017   VLDL 58 (H) 06/19/2017   LDLCALC 146 (H) 06/19/2017   LDLCALC 115 (H) 11/05/2016    Physical Findings: AIMS:  , ,  ,  ,    CIWA:    COWS:     Musculoskeletal: Strength & Muscle Tone: within normal limits Gait & Station: normal Patient leans: N/A  Psychiatric Specialty Exam: Physical Exam  Nursing note and vitals reviewed. Constitutional: He appears well-developed. He is sleeping.    Review of Systems  HENT: Positive for hearing loss.   Neurological: Negative.   Psychiatric/Behavioral: Positive for hallucinations.  All other systems reviewed and are negative.   Blood pressure 119/86, pulse (!) 105, temperature 98 F (36.7 C), temperature source Oral, resp. rate 18, height 5\' 6"  (1.676 m), weight 91.6 kg (202 lb), SpO2 99 %.Body mass index is 32.6 kg/m.  General Appearance: Fairly Groomed  Eye Contact:  NA  Speech:  NA and Pressured  Volume:  n/a  Mood:  NA  Affect:  NA  Thought Process:  NA  Orientation:  NA  Thought Content:  NA  Suicidal Thoughts:  No  Homicidal Thoughts:  No  Memory:  NA  Judgement:  Poor  Insight:  Lacking  Psychomotor Activity:  Decreased  Concentration:  Concentration: Fair and Attention Span: Fair  Recall:  [employer] of Knowledge:  Fair  Language:  Fair  Akathisia:  No  Handed:  Right  AIMS (if indicated):     Assets:  Communication Skills Desire for Improvement Financial Resources/Insurance Housing Physical Health Resilience  ADL's:  Intact  Cognition:  WNL  Sleep:  Number of Hours: 7.75     Treatment Plan Summary: Daily contact with patient to assess and evaluate symptoms and progress in treatment and Medication management   Benjamin Braun is a 52 year old male with a history of schizophrenia admitted floridly psychotic  in the context of medication noncompliance.  #Psychosis -continue Zyprexa 10 mg BID -continue  Haldol 10 mg nightly -offer Haldol decanoate  -continue Depakote 500 mg TID, level on 06/23/2017 -offer Trazodone 100 mg PRN -monitor how much he sleeps.  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

## 2017-06-21 NOTE — Plan of Care (Signed)
Patient oriented to unit. Patient denies SI/HI. Patient able to verbalize frustrations.    Progressing Education: Knowledge of [place] Education information/materials will improve 06/21/2017 2012 - Progressing by Alyson Locket I, RN Coping: Ability to verbalize frustrations and anger appropriately will improve 06/21/2017 2012 - Progressing by Alyson Locket I, RN Safety: Periods of time without injury will increase 06/21/2017 2012 - Progressing by Anson [place], RN

## 2017-06-21 NOTE — Plan of Care (Signed)
Patient verbalizes understanding of the general information that has been provided to him and does not voice any questions or concerns at this time. Patient has been sleeping throughout the day and states that he is depressed that he is not going home to his fish. Patient states that "I need to get home to my fish, they're nine dollars when they're small and twenty dollars a piece when they get big". Patient denies SI/HI/AVH at this time and patient also states "I need something for my nerves" but patient refused medication from this writer at this time. Patient has demonstrated self-control on the unit thus far. Patient has complied with his treatment plan/medication regimen thus far. Patient has been observed participating in unit groups without any issues. Patient is safe on the unit at this time.

## 2017-06-21 NOTE — Progress Notes (Signed)
D- Patient alert and oriented. Patient presents in a pleasant mood on assessment with questions about being discharged today. Patient states he's depressed because he wants to get home "to my fish, they're nine dollars a piece when I bought them and they go up to twenty dollars a piece when they get big". When this writer asked patient about his pain level, patient stated that "the bed broke my back" but did not report a pain level to this Probation officer. Patient denies SI, HI, AVH, at this time.   A- Support and encouragement provided.  Routine safety checks conducted every 15 minutes.  Patient informed to notify staff with problems or concerns.  R- Patient contracts for safety at this time. Patient receptive, calm, and cooperative. Patient interacts well with others on the unit.  Patient remains safe at this time.

## 2017-06-21 NOTE — [facility] Group Notes (Signed)
LCSW Group Therapy Note  06/21/2017 1:15pm  Type of Therapy and Topic:  Group Therapy:  Cognitive Distortions  Participation Level:  Did Not Attend   Description of Group:    Patients in this group will be introduced to the topic of cognitive distortions.  Patients will identify and describe cognitive distortions, describe the feelings these distortions create for them.  Patients will identify one or more situations in their personal life where they have cognitively distorted thinking and will verbalize challenging this cognitive distortion through positive thinking skills.  Patients will practice the skill of using positive affirmations to challenge cognitive distortions using affirmation cards.    Therapeutic Goals:  1. Patient will identify two or more cognitive distortions they have used 2. Patient will identify one or more emotions that stem from use of a cognitive distortion 3. Patient will demonstrate use of a positive affirmation to counter a cognitive distortion through discussion and/or role play. 4. Patient will describe one way cognitive distortions can be detrimental to wellness   Summary of Patient Progress:     Therapeutic Modalities:   Cognitive [place], LCSW 06/21/2017 11:00 AM

## 2017-06-21 NOTE — Progress Notes (Signed)
D: Patient denies SI/HI/AVH. Patient presents is irritable and peroccupided during assessment. Patient has no physical complaints at this time. Patient is paranoid and delusional. Patient refuses scheduled Zyrexa this evening stating "this is the liars pills, this is the devils pills," patient is compliant with other scheduled medications.   A: Patient was assessed by this nurse. Patient received scheduled medications. Q x 15 minute observation checks were completed for safety. Patient was provided with verbal education on provided medications. Patient care plan was reviewed. Patient was offered support and encouragement. Patient was encourage to attend groups, participate in unit activities and continue with plan of care.   R: Patient adheres with scheduled medication. Patient has no complaints of pain at this time. Patient is receptive to treatment and safety maintained on unit. Patient had an estimated 7 hours 45 minutes of restful sleep.

## 2017-06-22 NOTE — [facility] Group Notes (Signed)
[place] Group Notes:  (Nursing/MHT/Case Management/Adjunct)  Date:  06/22/2017  Time:  5:35 AM  Type of Therapy:  Psychoeducational Skills  Participation Level:  Did Not Attend    Summary of Progress/Problems:  Benjamin Braun 06/22/2017, 5:35 AM

## 2017-06-22 NOTE — Progress Notes (Signed)
D: Patient denies SI/HI. Patient is interacting with internal stimuli during assessment. Patient verbally contracts for safety. Patient is calm, cooperative and pleasant. Patient isolates to room. Patient has no complaints at this time.  A: Patient was assessed by this nurse. Patient was oriented to unit. Patient's safety was maintained on unit. Q x 15 minute observation checks were completed for safety. Patient care plan was reviewed. Patient was offered support and encouragement. Patient was encourage to attend groups, participate in unit activities and continue with plan of care.   R: Patient has no complaints of pain at this time. Patient is receptive to treatment and safety maintained on unit.

## 2017-06-22 NOTE — Progress Notes (Signed)
[facility] MD Progress Note  06/22/2017 6:52 PM CHASETON YEPIZ  MRN:  [id number]  Subjective:  Mr. Wojciak is in bed sleeping again.  He did wake up this morning and take his morning meds including zyprexa despite calling it the devil's pill.  I called his name and he grunted but did not talk to me.  Per nursing he is c/o hearing unclear voices.  Treatment plan. We restarted Haldol and Zyprexa for psychosis and depakote for mood stabilization. VPA level on Monday. We will try to give Haldol decanoate injection which he usually refuses "on religious grounds".  Social/disposition. Discharge to his apartment. Follow up with ACT team.  Principal Problem: Undifferentiated schizophrenia ([place]) Diagnosis:   Patient Active Problem List   Diagnosis Date Noted  . Undifferentiated schizophrenia ([place]) [F20.3] 11/04/2016  . Noncompliance with treatment [Z91.19] 04/14/2015   Total Time spent with patient: 15 minutes  Past Psychiatric History: schizophrenia  Past Medical History:  Past Medical History:  Diagnosis Date  . Anxiety   . Generalized anxiety disorder   . Schizophrenia [facility])     Past Surgical History:  Procedure Laterality Date  . TONSILLECTOMY     Family History:  Family History  Family history unknown: Yes   Family Psychiatric  History: none reported Social History:  Social History   Substance and Sexual Activity  Alcohol Use No  . Alcohol/week: 0.0 oz     Social History   Substance and Sexual Activity  Drug Use No    Social History   Socioeconomic History  . Marital status: Single    Spouse name: None  . Number of children: None  . Years of education: None  . Highest education level: None  Social Needs  . Financial resource strain: None  . Food insecurity - worry: None  . Food insecurity - inability: None  . Transportation needs - medical: None  . Transportation needs - non-medical: None  Occupational History  . None  Tobacco Use  . Smoking status: Former  Smoker    Types: Cigarettes    Last attempt to quit: 06/11/1999    Years since quitting: 18.0  . Smokeless tobacco: Never Used  Substance and Sexual Activity  . Alcohol use: No    Alcohol/week: 0.0 oz  . Drug use: No  . Sexual activity: No  Other Topics Concern  . None  Social History Narrative  . None   Additional Social History:    History of alcohol / drug use?: No history of alcohol / drug abuse                    Sleep: a lot  Appetite:  Fair  Current Medications: Current Facility-Administered Medications  Medication Dose Route Frequency Provider Last Rate Last Dose  . acetaminophen (TYLENOL) tablet 650 mg  650 mg Oral Q6H PRN Clapacs, [name], MD      . alum & mag hydroxide-simeth (MAALOX/MYLANTA) 200-200-20 MG/5ML suspension 30 mL  30 mL Oral Q4H PRN Clapacs, [name], MD      . divalproex (DEPAKOTE) DR tablet 500 mg  500 mg Oral Q8H Pucilowska, Jolanta B, MD   500 mg at 06/22/17 1449  . haloperidol (HALDOL) tablet 10 mg  10 mg Oral QHS Pucilowska, Jolanta B, MD   10 mg at 06/21/17 2111  . hydrOXYzine (ATARAX/VISTARIL) tablet 25 mg  25 mg Oral TID PRN Clapacs, [name], MD   25 mg at 06/21/17 2219  . magnesium hydroxide (MILK OF  MAGNESIA) suspension 30 mL  30 mL Oral Daily PRN Clapacs, [name], MD      . OLANZapine (ZYPREXA) tablet 10 mg  10 mg Oral BID AC & HS Pucilowska, Jolanta B, MD   10 mg at 06/22/17 0823  . traZODone (DESYREL) tablet 100 mg  100 mg Oral QHS PRN Clapacs, [name], MD   100 mg at 06/21/17 2219    Lab Results:  No results found for this or any previous visit (from the past 23 hour(s)).  Blood Alcohol level:  Lab Results  Component Value Date   ETH <10 06/18/2017   ETH <5 78/93/8101    Metabolic Disorder Labs: Lab Results  Component Value Date   HGBA1C 5.4 06/19/2017   MPG 108.28 06/19/2017   MPG 111 11/05/2016   No results found for: PROLACTIN Lab Results  Component Value Date   CHOL 240 (H) 06/19/2017   TRIG 290 (H) 06/19/2017    HDL 36 (L) 06/19/2017   CHOLHDL 6.7 06/19/2017   VLDL 58 (H) 06/19/2017   LDLCALC 146 (H) 06/19/2017   LDLCALC 115 (H) 11/05/2016    Physical Findings: AIMS:  , ,  ,  ,    CIWA:    COWS:     Musculoskeletal: Strength & Muscle Tone: within normal limits Gait & Station: normal Patient leans: N/A  Psychiatric Specialty Exam: Physical Exam  Nursing note and vitals reviewed. Constitutional: He appears well-developed. He is sleeping.    Review of Systems  HENT: Positive for hearing loss.   Neurological: Negative.   Psychiatric/Behavioral: Positive for hallucinations.  All other systems reviewed and are negative.   Blood pressure 117/87, pulse (!) 105, temperature 98 F (36.7 C), temperature source Oral, resp. rate 18, height 5\' 6"  (1.676 m), weight 91.6 kg (202 lb), SpO2 99 %.Body mass index is 32.6 kg/m.  General Appearance: Fairly Groomed  Eye Contact:  NA  Speech:  NA and Pressured  Volume:  n/a  Mood:  NA  Affect:  NA  Thought Process:  NA  Orientation:  NA  Thought Content:  NA  Suicidal Thoughts:  No  Homicidal Thoughts:  No  Memory:  NA  Judgement:  Poor  Insight:  Lacking  Psychomotor Activity:  Decreased  Concentration:  Concentration: Fair and Attention Span: Fair  Recall:  [employer] of Knowledge:  Fair  Language:  Fair  Akathisia:  No  Handed:  Right  AIMS (if indicated):     Assets:  Communication Skills Desire for Improvement Financial Resources/Insurance Housing Physical Health Resilience  ADL's:  Intact  Cognition:  WNL  Sleep:  Number of Hours: 7.75     Treatment Plan Summary: Daily contact with patient to assess and evaluate symptoms and progress in treatment and Medication management   Mr. Worthing is a 52 year old male with a history of schizophrenia admitted floridly psychotic in the context of medication noncompliance.  #Psychosis -continue Zyprexa 10 mg BID -continue  Haldol 10 mg nightly -offer Haldol decanoate  -continue  Depakote 500 mg TID, level on 06/23/2017 -offer Trazodone 100 mg PRN -monitor how much he sleeps.  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

## 2017-06-22 NOTE — Plan of Care (Signed)
Patient is oriented to unit., Patient's safety is maintained, patient was able to verbalize thoughts, and express need for medication.    Progressing Education: Knowledge of [facility] Education information/materials will improve 06/22/2017 2016 - Progressing by Anson [place], RN Verbalization of understanding the information provided will improve 06/22/2017 2016 - Progressing by Alyson Locket I, RN Safety: Periods of time without injury will increase 06/22/2017 2016 - Progressing by Anson [place], RN Health Behavior/Discharge Planning: Compliance with prescribed medication regimen will improve 06/22/2017 2016 - Progressing by Anson [place], RN

## 2017-06-22 NOTE — BHH Group Notes (Signed)
LCSW Group Therapy Note 06/22/2017 1:15pm  Type of Therapy and Topic: Group Therapy: Feelings Around Returning Home & Establishing a Supportive Framework and Supporting Oneself When Supports Not Available  Participation Level: Did Not Attend  Description of Group:  Patients first processed thoughts and feelings about upcoming discharge. These included fears of upcoming changes, lack of change, new living environments, judgements and expectations from others and overall stigma of mental health issues. The group then discussed the definition of a supportive framework, what that looks and feels like, and how do to discern it from an unhealthy non-supportive network. The group identified different types of supports as well as what to do when your family/friends are less than helpful or unavailable  Therapeutic Goals  1. Patient will identify one healthy supportive network that they can use at discharge. 2. Patient will identify one factor of a supportive framework and how to tell it from an unhealthy network. 3. Patient able to identify one coping skill to use when they do not have positive supports from others. 4. Patient will demonstrate ability to communicate their needs through discussion and/or role plays.  Summary of Patient Progress:    Therapeutic Modalities Cognitive Behavioral Therapy Motivational Interviewing   Benjamin Braun  CUEBAS-COLON, LCSW 06/22/2017 12:20 PM    

## 2017-06-22 NOTE — Plan of Care (Signed)
Patient verbalizes understanding of the general information that's been provided to him with no further questions or concerns at this time. Patient denies SI/HI and visual hallucinations at this time. Patient does endorse auditory hallucinations from time to time stating "they don't say the same thing". Patient denies depression, but does rate his anxiety level a "7/10", but can't explain to this writer why he's feeling this way. Patient has demonstrated self-control on the unit thus far. Patient has been compliant with medication/treatment plan thus far. Patient has been free from injury thus far and is safe on the unit at this time.

## 2017-06-22 NOTE — Progress Notes (Signed)
D- Patient alert and oriented. Patient presents in a slightly grumpy mood on assessment complaining of insomnia stating that he didn't sleep well, "every time I got to sleep they kept waking me up". Patient denies SI, HI, pain, and visual hallucinations at this time. Patient states that he hears auditory hallucinations at times stating "they don't say the same thing". Patient's goal for today is "goals to be released", which he will accomplish this goal by "Dr".    A- Scheduled medications administered to patient, per MD orders. Support and encouragement provided.  Routine safety checks conducted every 15 minutes.  Patient informed to notify staff with problems or concerns.  R- No adverse drug reactions noted. Patient contracts for safety at this time. Patient compliant with medications and treatment plan. Patient receptive, calm, and cooperative. Patient interacts well with others on the unit.  Patient remains safe at this time.

## 2017-06-22 NOTE — Progress Notes (Signed)
Patient requested for 2200 medications at this time. Patient has a history of non-compliance with medications. Patient stated "I will not be taking those devil pills later, they are on the devils side. I have a call at eight o'clock and I won't be taking any pills after." Medications were given at this time to assist in therapeutic medication administration.

## 2017-06-22 NOTE — Progress Notes (Signed)
D: Patient denies SI/HI/AVH. Patient verbally contracts for safety. Patient is agitated and cooperative. . Patient has no complaints at this time. Patient shaved and was supervised by this nurse. Patient requested medications, and due to recent history of refusal, request was accepted and medications were provided.   A: Patient was assessed by this nurse. Patient was oriented to unit. Patient's safety was maintained on unit. Q x 15 minute observation checks were completed for safety. Patient care plan was reviewed. Patient was offered support and encouragement. Patient was encourage to attend groups, participate in unit activities and continue with plan of care.   R: Patient has no complaints of pain at this time. Patient is receptive to treatment and safety maintained on unit.

## 2017-06-23 LAB — VALPROIC ACID LEVEL: VALPROIC ACID LVL: 138 ug/mL — AB (ref 50.0–100.0)

## 2017-06-23 MED ORDER — DIVALPROEX SODIUM 500 MG PO DR TAB
500.0000 mg | DELAYED_RELEASE_TABLET | Freq: Two times a day (BID) | ORAL | Status: DC
  Administered 2017-06-24 – 2017-06-26 (×5): 500 mg via ORAL
  Filled 2017-06-23 (×5): qty 1

## 2017-06-23 MED ORDER — HALOPERIDOL DECANOATE 100 MG/ML IM SOLN
100.0000 mg | INTRAMUSCULAR | Status: DC
  Administered 2017-06-23: 100 mg via INTRAMUSCULAR
  Filled 2017-06-23: qty 1

## 2017-06-23 MED ORDER — OLANZAPINE 5 MG PO TABS
15.0000 mg | ORAL_TABLET | Freq: Every day | ORAL | Status: DC
  Administered 2017-06-24 – 2017-06-25 (×2): 15 mg via ORAL
  Filled 2017-06-23 (×2): qty 1

## 2017-06-23 NOTE — Progress Notes (Deleted)
Recreation Therapy Notes  INPATIENT RECREATION THERAPY ASSESSMENT   Patient Details Name: Benjamin Braun MRN: [id number] DOB: [birth year] Today's Date: 06/23/2017  Patient stated she does not have time for recreation therapy.   Patient Stressors:    Coping Skills:      Horticulturist, commercial:    Leisure Interests (2+):     Awareness of Community Resources:     [employer]:     Current Use:    If no, Barriers?:    Patient Strengths:     Patient Identified Areas of Improvement:     Current Recreation Participation:     Patient Goal for Hospitalization:     [place] of Residence:     [place] of Residence:      Current SI (including self-harm):     Current HI:     Consent to Intern Participation:     Benjamin Braun 06/23/2017, 11:54 AM

## 2017-06-23 NOTE — Progress Notes (Signed)
Recreation Therapy Notes  Date: 01.14.2019  Time: 9:30 am  Location: Craft Room  Behavioral response: N/A  Intervention Topic: Self-Esteem  Discussion/Intervention: Patient did not attend group.  Clinical Observations/Feedback:  Patient did not attend group.  Mayci Haning LRT/CTRS          Lewis Grivas 06/23/2017 11:53 AM

## 2017-06-23 NOTE — Progress Notes (Signed)
[facility] MD Progress Note  06/23/2017 11:43 AM CLETIS CLACK  MRN:  [id number]  Subjective:   Mr. Krone met with treatment team today. He is very interested in going home to take care of his fish tank but is still rather paranoid telling us about his neighbors being unrully when in fact the patient has been making unjustified phone calls to the police. He has been refusing medications this weekend calling them "devil's pill". He agreed to take Haldol decanoate injection to speed up discharge.  Treatment plan. We will continue Zyprexa 15 mg nightly and 10 mg of Haldol nightly. We give Haldol decanoate injection today for psychosis. We will continue Depakote at a lower dose as his VPA level is 138.  Social/disposition. Discharge to his apartment. We will contact his brother who has always been helpful. Follow up TBE. He used to go to [facility] but has not followed up since lest discharge.  Principal Problem: Undifferentiated schizophrenia ([place]) Diagnosis:   Patient Active Problem List   Diagnosis Date Noted  . Undifferentiated schizophrenia ([place]) [F20.3] 11/04/2016    Priority: High  . Noncompliance with treatment [Z91.19] 04/14/2015   Total Time spent with patient: 30 minutes  Past Psychiatric History: schizophrenia  Past Medical History:  Past Medical History:  Diagnosis Date  . Anxiety   . Generalized anxiety disorder   . Schizophrenia [facility])     Past Surgical History:  Procedure Laterality Date  . TONSILLECTOMY     Family History:  Family History  Family history unknown: Yes   Family Psychiatric  History: none reported Social History:  Social History   Substance and Sexual Activity  Alcohol Use No  . Alcohol/week: 0.0 oz     Social History   Substance and Sexual Activity  Drug Use No    Social History   Socioeconomic History  . Marital status: Single    Spouse name: None  . Number of children: None  . Years of education: None  . Highest education level: None   Social Needs  . Financial resource strain: None  . Food insecurity - worry: None  . Food insecurity - inability: None  . Transportation needs - medical: None  . Transportation needs - non-medical: None  Occupational History  . None  Tobacco Use  . Smoking status: Former Smoker    Types: Cigarettes    Last attempt to quit: 06/11/1999    Years since quitting: 18.0  . Smokeless tobacco: Never Used  Substance and Sexual Activity  . Alcohol use: No    Alcohol/week: 0.0 oz  . Drug use: No  . Sexual activity: No  Other Topics Concern  . None  Social History Narrative  . None   Additional Social History:    History of alcohol / drug use?: No history of alcohol / drug abuse                    Sleep: Fair  Appetite:  Fair  Current Medications: Current Facility-Administered Medications  Medication Dose Route Frequency Provider Last Rate Last Dose  . acetaminophen (TYLENOL) tablet 650 mg  650 mg Oral Q6H PRN Clapacs, [name], MD      . alum & mag hydroxide-simeth (MAALOX/MYLANTA) 200-200-20 MG/5ML suspension 30 mL  30 mL Oral Q4H PRN Clapacs, [name], MD      . Derrill Memo ON 06/24/2017] divalproex (DEPAKOTE) DR tablet 500 mg  500 mg Oral Q12H Pucilowska, Jolanta B, MD      . haloperidol (  HALDOL) tablet 10 mg  10 mg Oral QHS Pucilowska, Jolanta B, MD   10 mg at 06/22/17 1926  . haloperidol decanoate (HALDOL DECANOATE) 100 MG/ML injection 100 mg  100 mg Intramuscular Q28 days Pucilowska, Jolanta B, MD      . hydrOXYzine (ATARAX/VISTARIL) tablet 25 mg  25 mg Oral TID PRN Clapacs, [name], MD   25 mg at 06/22/17 2122  . magnesium hydroxide (MILK OF MAGNESIA) suspension 30 mL  30 mL Oral Daily PRN Clapacs, [name], MD      . Derrill Memo ON 06/24/2017] OLANZapine (ZYPREXA) tablet 15 mg  15 mg Oral QHS Pucilowska, Jolanta B, MD      . traZODone (DESYREL) tablet 100 mg  100 mg Oral QHS PRN Clapacs, [name], MD   100 mg at 06/21/17 2219    Lab Results:  Results for orders placed or performed  during the hospital encounter of 06/18/17 (from the past 48 hour(s))  Valproic acid level     Status: Abnormal   Collection Time: 06/23/17  7:31 AM  Result Value Ref Range   Valproic Acid Lvl 138 (H) 50.0 - 100.0 ug/mL    Comment: Performed at [facility], [address] [place], [place] [address]    Blood Alcohol level:  Lab Results  Component Value Date   [facility] <10 06/18/2017   ETH <5 84/16/6063    Metabolic Disorder Labs: Lab Results  Component Value Date   HGBA1C 5.4 06/19/2017   MPG 108.28 06/19/2017   MPG 111 11/05/2016   No results found for: PROLACTIN Lab Results  Component Value Date   CHOL 240 (H) 06/19/2017   TRIG 290 (H) 06/19/2017   HDL 36 (L) 06/19/2017   CHOLHDL 6.7 06/19/2017   VLDL 58 (H) 06/19/2017   LDLCALC 146 (H) 06/19/2017   LDLCALC 115 (H) 11/05/2016    Physical Findings: AIMS:  , ,  ,  ,    CIWA:    COWS:     Musculoskeletal: Strength & Muscle Tone: within normal limits Gait & Station: normal Patient leans: N/A  Psychiatric Specialty Exam: Physical Exam  Nursing note and vitals reviewed. Psychiatric: His speech is normal. His affect is blunt. He is withdrawn. Thought content is paranoid. Cognition and memory are normal. He expresses impulsivity.    Review of Systems  HENT: Positive for hearing loss.   Neurological: Negative.   Psychiatric/Behavioral: Positive for hallucinations.  All other systems reviewed and are negative.   Blood pressure 117/87, pulse (!) 105, temperature 98 F (36.7 C), temperature source Oral, resp. rate 18, height 5' 6"  (1.676 m), weight 91.6 kg (202 lb), SpO2 99 %.Body mass index is 32.6 kg/m.  General Appearance: Casual  Eye Contact:  Good  Speech:  Slurred  Volume:  Normal  Mood:  Dysphoric  Affect:  Congruent  Thought Process:  Goal Directed and Descriptions of Associations: Intact  Orientation:  Full (Time, Place, and Person)  Thought Content:  Delusions and Paranoid Ideation  Suicidal  Thoughts:  No  Homicidal Thoughts:  No  Memory:  Immediate;   Fair Recent;   Fair Remote;   Fair  Judgement:  Poor  Insight:  Lacking  Psychomotor Activity:  Decreased  Concentration:  Concentration: Fair and Attention Span: Fair  Recall:  [employer] of Knowledge:  Fair  Language:  Fair  Akathisia:  No  Handed:  Right  AIMS (if indicated):     Assets:  Communication Skills Desire for Improvement Financial Resources/Insurance  Housing Physical Health Resilience Social Support  ADL's:  Intact  Cognition:  WNL  Sleep:  Number of Hours: 7.75     Treatment Plan Summary: Daily contact with patient to assess and evaluate symptoms and progress in treatment and Medication management   Mr. Rothman is a 52 year old male with a history of schizophrenia admitted floridly psychotic in the context of medication noncompliance.  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

## 2017-06-23 NOTE — BHH Counselor (Signed)
Adult Comprehensive Assessment  Patient ID: Benjamin Braun, male   DOB: 1965/08/10, 52 y.o.   MRN: [id number]  Information Source: Information source: Patient  Current Stressors:  Housing / Lack of housing: causing mischief in neighborhood by reporting problems to police repeatedly. Physical health (include injuries & life threatening diseases): hard of hearing  Living/Environment/Situation:  Living Arrangements: Alone  Family History:  Marital status: Single Does patient have children?: No  Childhood History:  By whom was/is the patient raised?: Father, Both parents, Other (Comment) Additional childhood history information: mom passed away as a child-patient reports she was killed by his step mother Did patient suffer any verbal/emotional/physical/sexual abuse as a child?: No Has patient ever been sexually abused/assaulted/raped as an adolescent or adult?: No Witnessed domestic violence?: No Has patient been effected by domestic violence as an adult?: No  Education:  Highest grade of school patient has completed: n/a Name of school: n/a  Employment/Work Situation:   Employment situation: On disability Why is patient on disability: mental illness How long has patient been on disability: 10+years Patient's job has been impacted by current illness: Yes Describe how patient's job has been impacted: cant work What is the longest time patient has a held a job?: 2 years 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  Strengths/Needs:    Able to advocate for self., difficulty in following up outpatient.  Discharge Plan:   Does patient have access to transportation?: No Plan for no access to transportation at discharge: TBD Will patient be returning to same living situation after discharge?: Yes Currently receiving community mental health services: No If no, would patient like referral for services when discharged?: Yes (What county?)(Continuing to assess for most appropraite referral) Does patient have financial barriers related to discharge medications?: No  Summary/Recommendations:   Summary and Recommendations (to be completed by the evaluator): Pt is 52yo male who comes to the ED by Police after becomming difficult in the community reporting crimes that he perscieved to be happening and such to Police repeatedly.  It is difficult to interview Pt at this time as he is tangential and frustrated about being in the hosptial and concerned about caring for his fish.  While in the hospital he will have the opportunity to participate in groups and therapeutic milieu. He will have medications managed and assistance with appropriate discharge planning. REccomendations include follow up as agreed upon and continuing medication regimen at discharge.  [place].LCSW 06/23/2017

## 2017-06-23 NOTE — [facility] Group Notes (Signed)
06/23/2017 1PM  Type of Therapy and Topic:  Group Therapy:  Overcoming Obstacles  Participation Level:  Did Not Attend    Description of Group:    In this group patients will be encouraged to explore what they see as obstacles to their own wellness and recovery. They will be guided to discuss their thoughts, feelings, and behaviors related to these obstacles. The group will process together ways to cope with barriers, with attention given to specific choices patients can make. Each patient will be challenged to identify changes they are motivated to make in order to overcome their obstacles. This group will be process-oriented, with patients participating in exploration of their own experiences as well as giving and receiving support and challenge from other group members.   Therapeutic Goals: 1. Patient will identify personal and current obstacles as they relate to admission. 2. Patient will identify barriers that currently interfere with their wellness or overcoming obstacles.  3. Patient will identify feelings, thought process and behaviors related to these barriers. 4. Patient will identify two changes they are willing to make to overcome these obstacles:      Summary of Patient Progress Patient was encouraged and invited to attend group. Patient did not attend group. Social worker will continue to encourage group participation in the future.     Therapeutic Modalities:   Cognitive Behavioral Therapy Solution Focused Therapy Motivational Interviewing Relapse Prevention Therapy    Darin Engels MSW, [place] 06/23/2017 1:46 PM

## 2017-06-24 NOTE — [facility] Group Notes (Signed)
06/24/2017 1PM  Type of Therapy/Topic:  Group Therapy:  Feelings about Diagnosis  Participation Level:  Minimal   Description of Group:   This group will allow patients to explore their thoughts and feelings about diagnoses they have received. Patients will be guided to explore their level of understanding and acceptance of these diagnoses. Facilitator will encourage patients to process their thoughts and feelings about the reactions of others to their diagnosis and will guide patients in identifying ways to discuss their diagnosis with significant others in their lives. This group will be process-oriented, with patients participating in exploration of their own experiences, giving and receiving support, and processing challenge from other group members.   Therapeutic Goals: 1. Patient will demonstrate understanding of diagnosis as evidenced by identifying two or more symptoms of the disorder 2. Patient will be able to express two feelings regarding the diagnosis 3. Patient will demonstrate their ability to communicate their needs through discussion and/or role play  Summary of Patient Progress: Pt attended group but did not participate in group discussions unless directly prompted by CSW. Pt was observed responding to internal stimuli and talking over other group members. Pt reported he is feeling, "alright today. I don't know why. I might be discharged this week." Pt reported that he agrees with his dx, "I"m Paranoid Schizophrenia, and I'm scared a lot. So, that makes sense to me." Pt continues to work towards his tx goals but has not yet reached them.     Therapeutic Modalities:   Cognitive Behavioral Therapy Brief Therapy  Alden Hipp, MSW, LCSW 06/24/2017 1:31 PM

## 2017-06-24 NOTE — Progress Notes (Signed)
[facility] MD Progress Note  06/24/2017 1:43 PM Benjamin Braun  MRN:  [id number]  Subjective:   Benjamin Braun was really paranoid and rude to the nurses and was very easily agitated and loud yesterday. He is much calmer today. His thoughts are better organized and he is able to hold a conversation without rambling.He is still paranoid about his neighbors but does not get agitated when he talk about their drug use. He denies any problems and wants to be discharged as soon as possible to take care of his fish and a dog who is with a neighbor now but not for long  I tried to call his brother today. Male voice told me it was the wrong number. It was the right number.  Treatment plan. He received Haldol decanoate 100 mg injection yesterday. We continue Depakote and Zyprexa.  Social/disposition. He will return to his apartment. He never follows up in the community.  Principal Problem: Undifferentiated schizophrenia (Benjamin Braun) Diagnosis:   Patient Active Problem List   Diagnosis Date Noted  . 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 Noncompliance with treatment [Z91.19] 04/14/2015   Total Time spent with patient: 20 minutes  Past Psychiatric History: schizophrenia  Past Medical History:  Past Medical History:  Diagnosis Date  . Anxiety   . Generalized anxiety disorder   . Schizophrenia Benjamin Braun)     Past Surgical History:  Procedure Laterality Date  . TONSILLECTOMY     Family History:  Family History  Family history unknown: Yes   Family Psychiatric  History: unknown Social History:  Social History   Substance and Sexual Activity  Alcohol Use No  . Alcohol/week: 0.0 oz     Social History   Substance and Sexual Activity  Drug Use No    Social History   Socioeconomic History  . Marital status: Single    Spouse name: None  . Number of children: None  . Years of education: None  . Highest education level: None  Social Needs  . Financial resource  strain: None  . Food insecurity - worry: None  . Food insecurity - inability: None  . Transportation needs - medical: None  . Transportation needs - non-medical: None  Occupational History  . None  Tobacco Use  . Smoking status: Former Smoker    Types: Cigarettes    Last attempt to quit: 06/11/1999    Years since quitting: 18.0  . Smokeless tobacco: Never Used  Substance and Sexual Activity  . Alcohol use: No    Alcohol/week: 0.0 oz  . Drug use: No  . Sexual activity: No  Other Topics Concern  . None  Social History Narrative  . None   Additional Social History:    History of alcohol / drug use?: No history of alcohol / drug abuse                    Sleep: Fair  Appetite:  Fair  Current Medications: Current Facility-Administered Medications  Medication Dose Route Frequency Provider Last Rate Last Dose  . acetaminophen (TYLENOL) tablet 650 mg  650 mg Oral Q6H PRN Clapacs, [name], MD      . alum & mag hydroxide-simeth (MAALOX/MYLANTA) 200-200-20 MG/5ML suspension 30 mL  30 mL Oral Q4H PRN Clapacs, [name], MD      . divalproex (DEPAKOTE) DR tablet 500 mg  500 mg Oral Q12H Mallorie Norrod B, MD   500 mg at 06/24/17 0817  . haloperidol (  HALDOL) tablet 10 mg  10 mg Oral QHS Ginna Schuur B, MD   10 mg at 06/23/17 2155  . haloperidol decanoate (HALDOL DECANOATE) 100 MG/ML injection 100 mg  100 mg Intramuscular Q28 days Alynna Hargrove B, MD   100 mg at 06/23/17 1256  . hydrOXYzine (ATARAX/VISTARIL) tablet 25 mg  25 mg Oral TID PRN Clapacs, [name], MD   25 mg at 06/22/17 2122  . magnesium hydroxide (MILK OF MAGNESIA) suspension 30 mL  30 mL Oral Daily PRN Clapacs, [name], MD      . OLANZapine (ZYPREXA) tablet 15 mg  15 mg Oral QHS Tamela Elsayed B, MD      . traZODone (DESYREL) tablet 100 mg  100 mg Oral QHS PRN Clapacs, [name], MD   100 mg at 06/23/17 2155    Lab Results:  Results for orders placed or performed during the Braun encounter of 06/18/17  (from the past 48 hour(s))  Valproic acid level     Status: Abnormal   Collection Time: 06/23/17  7:31 AM  Result Value Ref Range   Valproic Acid Lvl 138 (H) 50.0 - 100.0 ug/mL    Comment: Performed at [facility], [address] [place], [place] [address]    Blood Alcohol level:  Lab Results  Component Value Date   [facility] <10 06/18/2017   ETH <5 49/70/2637    Metabolic Disorder Labs: Lab Results  Component Value Date   HGBA1C 5.4 06/19/2017   MPG 108.28 06/19/2017   MPG 111 11/05/2016   No results found for: PROLACTIN Lab Results  Component Value Date   CHOL 240 (H) 06/19/2017   TRIG 290 (H) 06/19/2017   HDL 36 (L) 06/19/2017   CHOLHDL 6.7 06/19/2017   VLDL 58 (H) 06/19/2017   LDLCALC 146 (H) 06/19/2017   LDLCALC 115 (H) 11/05/2016    Physical Findings: AIMS:  , ,  ,  ,    CIWA:    COWS:     Musculoskeletal: Strength & Muscle Tone: within normal limits Gait & Station: normal Patient leans: N/A  Psychiatric Specialty Exam: Physical Exam  Nursing note and vitals reviewed. Psychiatric: He has a normal mood and affect. His behavior is normal. His speech is rapid and/or pressured. Thought content is paranoid and delusional. Cognition and memory are normal. He expresses impulsivity.    Review of Systems  Neurological: Negative.   Psychiatric/Behavioral: Positive for hallucinations.  All other systems reviewed and are negative.   Blood pressure 97/78, pulse 100, temperature 97.6 F (36.4 C), temperature source Oral, resp. rate 16, height 5\' 6"  (1.676 m), weight 91.6 kg (202 lb), SpO2 98 %.Body mass index is 32.6 kg/m.  General Appearance: Casual  Eye Contact:  Good  Speech:  Slurred  Volume:  Increased  Mood:  Dysphoric  Affect:  Congruent  Thought Process:  Goal Directed and Descriptions of Associations: Intact  Orientation:  Full (Time, Place, and Person)  Thought Content:  Delusions and Paranoid Ideation  Suicidal Thoughts:  No  Homicidal  Thoughts:  No  Memory:  Immediate;   Fair Recent;   Fair Remote;   Fair  Judgement:  Poor  Insight:  Shallow  Psychomotor Activity:  Normal  Concentration:  Concentration: Fair and Attention Span: Fair  Recall:  [employer] of Knowledge:  Fair  Language:  Fair  Akathisia:  No  Handed:  Right  AIMS (if indicated):     Assets:  Communication Skills Desire for Improvement Financial Resources/Insurance  Housing Physical Health Resilience Social Support  ADL's:  Intact  Cognition:  WNL  Sleep:  Number of Hours: 5.45     Treatment Plan Summary: Daily contact with patient to assess and evaluate symptoms and progress in treatment and Medication management   Mr. Engen is a 53 year old male with a history of schizophrenia admitted floridly psychotic in the context of medication noncompliance.  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

## 2017-06-24 NOTE — Plan of Care (Signed)
Patient slept for Estimated Hours of 5.45; Precautionary checks every 15 minutes for safety maintained, room free of safety hazards, patient sustains no injury or falls during this shift.

## 2017-06-24 NOTE — Plan of Care (Signed)
Limited interaction with  peers  on unit . Patient in room sleeping  during shift . Staff assist  patient  in  redirect with [place]  informations . Thought process improving. No anger out burst . Able to maintain  self control . Progressing Activity: Sleeping patterns will improve 06/24/2017 1205 - Progressing by Leodis Liverpool, RN Education: Knowledge of [place] Education information/materials will improve 06/24/2017 1205 - Progressing by Leodis Liverpool, RN Mental status will improve 06/24/2017 1205 - Progressing by Leodis Liverpool, RN Verbalization of understanding the information provided will improve 06/24/2017 1205 - Progressing by Leodis Liverpool, RN Coping: Ability to verbalize frustrations and anger appropriately will improve 06/24/2017 1205 - Progressing by Leodis Liverpool, RN Ability to demonstrate self-control will improve 06/24/2017 1205 - Progressing by Leodis Liverpool, RN Health Behavior/Discharge Planning: Identification of resources available to assist in meeting health care needs will improve 06/24/2017 1205 - Progressing by Leodis Liverpool, RN Compliance with treatment plan for underlying cause of condition will improve 06/24/2017 1205 - Progressing by Leodis Liverpool, RN Safety: Periods of time without injury will increase 06/24/2017 1205 - Progressing by Leodis Liverpool, RN Coping: Ability to cope will improve 06/24/2017 1205 - Progressing by Leodis Liverpool, RN Ability to verbalize feelings will improve 06/24/2017 1205 - Progressing by Leodis Liverpool, RN Health Behavior/Discharge Planning: Compliance with prescribed medication regimen will improve 06/24/2017 1205 - Progressing by Leodis Liverpool, RN

## 2017-06-24 NOTE — Progress Notes (Signed)
Patient ID: [name], male   DOB: 11-04-1965, 52 y.o.   MRN: [id number] Visible, odd and bizarre in behavior, incoherent, tangential, rude on approach, "where are you from, [place], you guys don't believe in the words, (reached in to his pocket, brought out a small pocket Bible, paged through it, put it back in his pocket; made some animated face act), you believe in voodoo didn't ya, that's why bad things happen to ya'all" I ended my introduction and let him be.

## 2017-06-24 NOTE — Progress Notes (Signed)
D: Patient stated slept  Poor last night .Stated appetite is good and energy levellowl. Stated concentration ipoor. Stated on Depression scale 7 hopeless 7 and anxiety 8 .( low 0-10 high) Denies suicidal  homicidal ideations  . Constantly  Talking to   Self. No pain concerns . Appropriate ADL'S. Interacting with peers and staff.Limited interaction with peers on unit . Patient in room sleeping during shift . Staff assist patient in redirect with [place] informations . Thought process improving. No anger out burst . Able to maintain self control . Limited interaction with peers on unit . Patient in room sleeping during shift . Staff assist patient in redirect with [place] informations . Thought process improving. No anger out burst . Able to maintain self control .    A: Encourage patient participation with unit programming . Instruction  Given on  Medication , verbalize understanding.  R: Voice no other concerns. Staff continue to monitor

## 2017-06-24 NOTE — Progress Notes (Signed)
Recreation Therapy Notes  Date: 01.15.2019  Time: 9:30 am  Location: Craft Room  Behavioral response: N/A  Intervention Topic: Coping skills  Discussion/Intervention: Patient did not attend group. Clinical Observations/Feedback:  Patient did not attend group. [name] LRT/CTRS         Benjamin Braun 06/24/2017 10:46 AM

## 2017-06-25 LAB — VALPROIC ACID LEVEL: Valproic Acid Lvl: 76 ug/mL (ref 50.0–100.0)

## 2017-06-25 MED ORDER — HALOPERIDOL DECANOATE 100 MG/ML IM SOLN: 100.0000 mg | INTRAMUSCULAR | 1 refills | Status: DC

## 2017-06-25 MED ORDER — DIVALPROEX SODIUM 500 MG PO DR TAB: 500.0000 mg | DELAYED_RELEASE_TABLET | Freq: Two times a day (BID) | ORAL | 1 refills | Status: DC

## 2017-06-25 MED ORDER — OLANZAPINE 15 MG PO TABS: 15.0000 mg | ORAL_TABLET | Freq: Every day | ORAL | 1 refills | Status: DC

## 2017-06-25 NOTE — [facility] Group Notes (Signed)
06/25/2017 9:30AM  Type of Therapy/Topic:  Group Therapy:  Emotion Regulation  Participation Level:  Did Not Attend   Description of Group:   The purpose of this group is to assist patients in learning to regulate negative emotions and experience positive emotions. Patients will be guided to discuss ways in which they have been vulnerable to their negative emotions. These vulnerabilities will be juxtaposed with experiences of positive emotions or situations, and patients will be challenged to use positive emotions to combat negative ones. Special emphasis will be placed on coping with negative emotions in conflict situations, and patients will process healthy conflict resolution skills.  Therapeutic Goals: 1. Patient will identify two positive emotions or experiences to reflect on in order to balance out negative emotions 2. Patient will label two or more emotions that they find the most difficult to experience 3. Patient will demonstrate positive conflict resolution skills through discussion and/or role plays  Summary of Patient Progress: Patient was encouraged and invited to attend group. Patient did not attend group. Social worker will continue to encourage group participation in the future.       Therapeutic Modalities:   Cognitive Behavioral Therapy Feelings Identification Dialectical Behavioral Therapy   Darin Engels, [place] 06/25/2017 10:18 AM

## 2017-06-25 NOTE — Progress Notes (Signed)
[facility] MD Progress Note  06/25/2017 11:35 AM Benjamin Braun  MRN:  [id number]  Subjective:   Mr. Apolinar has much improved and is able now to have a rational conversation. He did signs permissions to talk to his family and ACt team today. Has no complaints. Tolerates medications well. Sleep and appatite are good.  Met with his brother, Louie Casa, and sister-in-law. They are supportive but tired of his paranoid behavior. The patient has not seen a psychiatrist or taken medications since last discharge in May. He has legal charges pending for aggressive behavior.   Treatment plan. He is on Zyprexa and Depakote here but Haldol decanoate is his most important medication. He refused it in the community in the past. He was accepted to [facility] team care and will meet with them tomorrow prior to discharge. I will also initiate involuntary outpatient commitment.  Social/disposition. Discharge to his apartment. Follow up with ACT team.  Principal Problem: Undifferentiated schizophrenia ([place]) Diagnosis:   Patient Active Problem List   Diagnosis Date Noted  . Undifferentiated schizophrenia ([place]) [F20.3] 11/04/2016    Priority: High  . Noncompliance with treatment [Z91.19] 04/14/2015   Total Time spent with patient: 45 minutes  Past Psychiatric History: schizophrenia  Past Medical History:  Past Medical History:  Diagnosis Date  . Anxiety   . Generalized anxiety disorder   . Schizophrenia [facility])     Past Surgical History:  Procedure Laterality Date  . TONSILLECTOMY     Family History:  Family History  Family history unknown: Yes   Family Psychiatric  History: none reported Social History:  Social History   Substance and Sexual Activity  Alcohol Use No  . Alcohol/week: 0.0 oz     Social History   Substance and Sexual Activity  Drug Use No    Social History   Socioeconomic History  . Marital status: Single    Spouse name: None  . Number of children: None  . Years of  education: None  . Highest education level: None  Social Needs  . Financial resource strain: None  . Food insecurity - worry: None  . Food insecurity - inability: None  . Transportation needs - medical: None  . Transportation needs - non-medical: None  Occupational History  . None  Tobacco Use  . Smoking status: Former Smoker    Types: Cigarettes    Last attempt to quit: 06/11/1999    Years since quitting: 18.0  . Smokeless tobacco: Never Used  Substance and Sexual Activity  . Alcohol use: No    Alcohol/week: 0.0 oz  . Drug use: No  . Sexual activity: No  Other Topics Concern  . None  Social History Narrative  . None   Additional Social History:  Specify valuables returned: [employer] , keys black case grey lighter  cell phone , black wallet [place] [employer] History of alcohol / drug use?: No history of alcohol / drug abuse                    Sleep: Fair  Appetite:  Fair  Current Medications: Current Facility-Administered Medications  Medication Dose Route Frequency Provider Last Rate Last Dose  . acetaminophen (TYLENOL) tablet 650 mg  650 mg Oral Q6H PRN Clapacs, [name], MD   650 mg at 06/25/17 0105  . alum & mag hydroxide-simeth (MAALOX/MYLANTA) 200-200-20 MG/5ML suspension 30 mL  30 mL Oral Q4H PRN Clapacs, [name], MD      .  divalproex (DEPAKOTE) DR tablet 500 mg  500 mg Oral Q12H Noga Fogg B, MD   500 mg at 06/25/17 0803  . haloperidol (HALDOL) tablet 10 mg  10 mg Oral QHS Epimenio Schetter B, MD   10 mg at 06/24/17 2130  . haloperidol decanoate (HALDOL DECANOATE) 100 MG/ML injection 100 mg  100 mg Intramuscular Q28 days Khaalid Lefkowitz B, MD   100 mg at 06/23/17 1256  . hydrOXYzine (ATARAX/VISTARIL) tablet 25 mg  25 mg Oral TID PRN Clapacs, [name], MD   25 mg at 06/25/17 0105  . magnesium hydroxide (MILK OF MAGNESIA) suspension 30 mL  30 mL Oral Daily PRN Clapacs, [name], MD      . OLANZapine (ZYPREXA) tablet 15 mg  15 mg  Oral QHS Jenie Parish B, MD   15 mg at 06/24/17 2130  . traZODone (DESYREL) tablet 100 mg  100 mg Oral QHS PRN Clapacs, [name], MD   100 mg at 06/25/17 0105    Lab Results: No results found for this or any previous visit (from the past 48 hour(s)).  Blood Alcohol level:  Lab Results  Component Value Date   ETH <10 06/18/2017   ETH <5 32/99/2426    Metabolic Disorder Labs: Lab Results  Component Value Date   HGBA1C 5.4 06/19/2017   MPG 108.28 06/19/2017   MPG 111 11/05/2016   No results found for: PROLACTIN Lab Results  Component Value Date   CHOL 240 (H) 06/19/2017   TRIG 290 (H) 06/19/2017   HDL 36 (L) 06/19/2017   CHOLHDL 6.7 06/19/2017   VLDL 58 (H) 06/19/2017   LDLCALC 146 (H) 06/19/2017   LDLCALC 115 (H) 11/05/2016    Physical Findings: AIMS:  , ,  ,  ,    CIWA:    COWS:     Musculoskeletal: Strength & Muscle Tone: within normal limits Gait & Station: normal Patient leans: N/A  Psychiatric Specialty Exam: Physical Exam  Nursing note and vitals reviewed. Psychiatric: He has a normal mood and affect. His speech is normal and behavior is normal. Thought content is paranoid. Cognition and memory are normal. He expresses impulsivity.    Review of Systems  HENT: Positive for hearing loss.   Neurological: Negative.   Psychiatric/Behavioral: Negative.   All other systems reviewed and are negative.   Blood pressure 120/72, pulse 69, temperature 98 F (36.7 C), temperature source Oral, resp. rate 16, height 5' 6"  (1.676 m), weight 91.6 kg (202 lb), SpO2 95 %.Body mass index is 32.6 kg/m.  General Appearance: Casual  Eye Contact:  Good  Speech:  Slurred  Volume:  Normal  Mood:  Euthymic  Affect:  Appropriate  Thought Process:  Goal Directed and Descriptions of Associations: Intact  Orientation:  Full (Time, Place, and Person)  Thought Content:  Delusions and Paranoid Ideation  Suicidal Thoughts:  No  Homicidal Thoughts:  No  Memory:  Immediate;    Fair Recent;   Fair Remote;   Fair  Judgement:  Impaired  Insight:  Shallow  Psychomotor Activity:  Normal  Concentration:  Concentration: Fair and Attention Span: Fair  Recall:  [employer] of Knowledge:  Fair  Language:  Fair  Akathisia:  No  Handed:  Right  AIMS (if indicated):     Assets:  Communication Skills Desire for Improvement Financial Resources/Insurance Housing Physical Health Resilience Social Support  ADL's:  Intact  Cognition:  WNL  Sleep:  Number of Hours: 6.3     Treatment  Plan Summary: Daily contact with patient to assess and evaluate symptoms and progress in treatment and Medication management   Mr. Calabrese is a 52 year old male with a history of schizophrenia admitted floridly psychotic in the context of medication noncompliance.  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

## 2017-06-25 NOTE — BHH Suicide Risk Assessment (Signed)
[facility] Discharge Suicide Risk Assessment   Principal Problem: Undifferentiated schizophrenia [facility]) Discharge Diagnoses:  Patient Active Problem List   Diagnosis Date Noted  . Undifferentiated schizophrenia ([place]) [F20.3] 11/04/2016    Priority: High  . Noncompliance with treatment [Z91.19] 04/14/2015    Total Time spent with patient: 30 minutes  Musculoskeletal: Strength & Muscle Tone: within normal limits Gait & Station: normal Patient leans: N/A  Psychiatric Specialty Exam: Review of Systems  Neurological: Negative.   Psychiatric/Behavioral: Negative.   All other systems reviewed and are negative.   Blood pressure 120/72, pulse 69, temperature 98 F (36.7 C), temperature source Oral, resp. rate 16, height 5\' 6"  (1.676 m), weight 91.6 kg (202 lb), SpO2 95 %.Body mass index is 32.6 kg/m.  General Appearance: Casual  Eye Contact::  Good  Speech:  Slurred409  Volume:  Normal  Mood:  Euthymic  Affect:  Appropriate  Thought Process:  Goal Directed and Descriptions of Associations: Intact  Orientation:  Full (Time, Place, and Person)  Thought Content:  Delusions and Paranoid Ideation  Suicidal Thoughts:  No  Homicidal Thoughts:  No  Memory:  Immediate;   Fair Recent;   Fair Remote;   Fair  Judgement:  Poor  Insight:  Shallow  Psychomotor Activity:  Normal  Concentration:  Fair  Recall:  [place]  Language: Fair  Akathisia:  No  Handed:  Right  AIMS (if indicated):     Assets:  Communication Skills Desire for Improvement Financial Resources/Insurance Housing Physical Health Resilience Social Support  Sleep:  Number of Hours: 6.3  Cognition: WNL  ADL's:  Intact   Mental Status Per Nursing Assessment::   On Admission:  NA  Demographic Factors:  Male, Caucasian and Living alone  Loss Factors: NA  Historical Factors: Impulsivity  Risk Reduction Factors:   Sense of responsibility to family and Positive social support  Continued  Clinical Symptoms:  Schizophrenia:   Paranoid or undifferentiated type  Cognitive Features That Contribute To Risk:  None    Suicide Risk:  Minimal: No identifiable suicidal ideation.  Patients presenting with no risk factors but with morbid ruminations; may be classified as minimal risk based on the severity of the depressive symptoms    Plan Of Care/Follow-up recommendations:  Activity:  as tolerated Diet:  low sodium heart healthy Other:  keep follow up appointments  Orson Slick, MD 06/25/2017, 9:06 AM

## 2017-06-25 NOTE — Progress Notes (Signed)
D: Affect pleasant on approach this am . Patient ,very hard to awaken for breakfast . Patient  Continue to  Talk to himself all shift. Limited involvement   With his peers . Appetite good at meals energy level  Is normal. Stated concentration is fair.  No pain concerns . Appropriate ADL'S.  .  A: Encourage patient participation with unit programming . Instruction  Given on  Medication , verbalize understanding. Instructed patient his brother  Brought  Clothes and coat  For his discharge tomorrow    R: Voice no other concerns. Staff continue to monitor

## 2017-06-25 NOTE — BHH Suicide Risk Assessment (Signed)
[facility] INPATIENT:  Family/Significant Other Suicide Prevention Education  Suicide Prevention Education:  Education Completed; Laszlo Ellerby, Brother in person,  (name of family member/significant other) has been identified by the patient as the family member/significant other with whom the patient will be residing, and identified as the person(s) who will aid the patient in the event of a mental health crisis (suicidal ideations/suicide attempt).  With written consent from the patient, the family member/significant other has been provided the following suicide prevention education, prior to the and/or following the discharge of the patient.  The suicide prevention education provided includes the following:  Suicide risk factors  Suicide prevention and interventions  National Suicide Hotline telephone number  [facility] assessment telephone number  [facility] Emergency Assistance [place] and/or Residential Mobile Crisis Unit telephone number  Request made of family/significant other to:  Remove weapons (e.g., guns, rifles, knives), all items previously/currently identified as safety concern.    Remove drugs/medications (over-the-counter, prescriptions, illicit drugs), all items previously/currently identified as a safety concern.  The family member/significant other verbalizes understanding of the suicide prevention education information provided.  The family member/significant other agrees to remove the items of safety concern listed above.  Carloyn Jaeger Aeric Burnham, LCSW 06/25/2017, 10:03 AM

## 2017-06-25 NOTE — Plan of Care (Signed)
Patient slept for Estimated Hours of 6.30; Precautionary checks every 15 minutes for safety maintained, room free of safety hazards, patient sustains no injury or falls during this shift.  

## 2017-06-25 NOTE — Progress Notes (Signed)
Patient ID: Benjamin Braun, male   DOB: 04-Jun-1966, 52 y.o.   MRN: [id number] CSW made ACTT referral to [facility].  Per [name] at [facility] they are willing to meet with and do screening for services for tomorrow 06/26/17.

## 2017-06-25 NOTE — Progress Notes (Signed)
Patient ID: Benjamin Braun, male   DOB: 04-02-1966, 52 y.o.   MRN: [id number] Restless, paranoid, responding to internal stimuli, denied SI/HI, potential for aggression, compliant with medications. PRNs given during this shift @ 0105: Tylenol 650 mg (mild discomfort), Trazodone 100 mg (sleep), hydroxyzine 25 mg (anxiety/itching).

## 2017-06-25 NOTE — Tx Team (Signed)
Interdisciplinary Treatment and Diagnostic Plan Update  06/25/2017 Time of Session: 11:50 AM Benjamin Braun MRN: [id number]  Principal Diagnosis: Undifferentiated schizophrenia [facility])  Secondary Diagnoses: Principal Problem:   Undifferentiated schizophrenia ([place]) Active Problems:   Noncompliance with treatment   Current Medications:  Current Facility-Administered Medications  Medication Dose Route Frequency Provider Last Rate Last Dose  . acetaminophen (TYLENOL) tablet 650 mg  650 mg Oral Q6H PRN Clapacs, [name], MD   650 mg at 06/25/17 0105  . alum & mag hydroxide-simeth (MAALOX/MYLANTA) 200-200-20 MG/5ML suspension 30 mL  30 mL Oral Q4H PRN Clapacs, [name], MD      . divalproex (DEPAKOTE) DR tablet 500 mg  500 mg Oral Q12H Pucilowska, Jolanta B, MD   500 mg at 06/25/17 0803  . haloperidol (HALDOL) tablet 10 mg  10 mg Oral QHS Pucilowska, Jolanta B, MD   10 mg at 06/24/17 2130  . haloperidol decanoate (HALDOL DECANOATE) 100 MG/ML injection 100 mg  100 mg Intramuscular Q28 days Pucilowska, Jolanta B, MD   100 mg at 06/23/17 1256  . hydrOXYzine (ATARAX/VISTARIL) tablet 25 mg  25 mg Oral TID PRN Clapacs, [name], MD   25 mg at 06/25/17 0105  . magnesium hydroxide (MILK OF MAGNESIA) suspension 30 mL  30 mL Oral Daily PRN Clapacs, [name], MD      . OLANZapine (ZYPREXA) tablet 15 mg  15 mg Oral QHS Pucilowska, Jolanta B, MD   15 mg at 06/24/17 2130  . traZODone (DESYREL) tablet 100 mg  100 mg Oral QHS PRN Clapacs, [name], MD   100 mg at 06/25/17 0105   PTA Medications: Medications Prior to Admission  Medication Sig Dispense Refill Last Dose  . cyclobenzaprine (FLEXERIL) 10 MG tablet Take 1 tablet (10 mg total) by mouth every 8 (eight) hours as needed (for back/neck pain). (Patient not taking: Reported on 06/18/2017) 90 tablet 1 Not Taking at Unknown time  . divalproex (DEPAKOTE) 250 MG DR tablet Take 3 tablets (750 mg total) by mouth every 12 (twelve) hours. (Patient not taking: Reported on  06/18/2017) 180 tablet 1 Not Taking at Unknown time  . hydrOXYzine (ATARAX/VISTARIL) 50 MG tablet Take 1 tablet (50 mg total) by mouth at bedtime. (Patient not taking: Reported on 06/18/2017) 90 tablet 1 Not Taking at Unknown time  . traZODone (DESYREL) 100 MG tablet Take 1 tablet (100 mg total) by mouth at bedtime. (Patient not taking: Reported on 06/18/2017) 30 tablet 1 Not Taking at Unknown time    Patient Stressors: Legal issue Other: Paranoia regarding neighbors and police  Patient Strengths: Active sense of humor Average or above average intelligence Capable of independent living Communication skills  Treatment Modalities: Medication Management, Group therapy, Case management,  1 to 1 session with clinician, Psychoeducation, Recreational therapy.   Physician Treatment Plan for Primary Diagnosis: Undifferentiated schizophrenia ([place]) Long Term Goal(s): Improvement in symptoms so as ready for discharge NA   Short Term Goals: Ability to identify changes in lifestyle to reduce recurrence of condition will improve Ability to verbalize feelings will improve Ability to disclose and discuss suicidal ideas Ability to demonstrate self-control will improve Ability to identify and develop effective coping behaviors will improve Ability to maintain clinical measurements within normal limits will improve Compliance with prescribed medications will improve Ability to identify triggers associated with substance abuse/mental health issues will improve NA  Medication Management: Evaluate patient's response, side effects, and tolerance of medication regimen.  Therapeutic Interventions: 1 to 1 sessions, Unit Group  sessions and Medication administration.  Evaluation of Outcomes: Progressing  Physician Treatment Plan for Secondary Diagnosis: Principal Problem:   Undifferentiated schizophrenia ([place]) Active Problems:   Noncompliance with treatment  Long Term Goal(s): Improvement in symptoms so as  ready for discharge NA   Short Term Goals: Ability to identify changes in lifestyle to reduce recurrence of condition will improve Ability to verbalize feelings will improve Ability to disclose and discuss suicidal ideas Ability to demonstrate self-control will improve Ability to identify and develop effective coping behaviors will improve Ability to maintain clinical measurements within normal limits will improve Compliance with prescribed medications will improve Ability to identify triggers associated with substance abuse/mental health issues will improve NA     Medication Management: Evaluate patient's response, side effects, and tolerance of medication regimen.  Therapeutic Interventions: 1 to 1 sessions, Unit Group sessions and Medication administration.  Evaluation of Outcomes: Progressing   RN Treatment Plan for Primary Diagnosis: Undifferentiated schizophrenia ([place]) Long Term Goal(s): Knowledge of disease and therapeutic regimen to maintain health will improve  Short Term Goals: Ability to participate in decision making will improve, Ability to disclose and discuss suicidal ideas, Ability to identify and develop effective coping behaviors will improve and Compliance with prescribed medications will improve  Medication Management: RN will administer medications as ordered by provider, will assess and evaluate patient's response and provide education to patient for prescribed medication. RN will report any adverse and/or side effects to prescribing provider.  Therapeutic Interventions: 1 on 1 counseling sessions, Psychoeducation, Medication administration, Evaluate responses to treatment, Monitor vital signs and CBGs as ordered, Perform/monitor CIWA, COWS, AIMS and Fall Risk screenings as ordered, Perform wound care treatments as ordered.  Evaluation of Outcomes: Progressing   LCSW Treatment Plan for Primary Diagnosis: Undifferentiated schizophrenia ([place]) Long Term Goal(s):  Safe transition to appropriate next level of care at discharge, Engage patient in therapeutic group addressing interpersonal concerns.  Short Term Goals: Engage patient in aftercare planning with referrals and resources, Increase social support and Increase skills for wellness and recovery  Therapeutic Interventions: Assess for all discharge needs, 1 to 1 time with Social worker, Explore available resources and support systems, Assess for adequacy in community support network, Educate family and significant other(s) on suicide prevention, Complete Psychosocial Assessment, Interpersonal group therapy.  Evaluation of Outcomes: Progressing   Progress in Treatment: Attending groups: No. Participating in groups: No. Taking medication as prescribed: Yes. Toleration medication: Yes. Family/Significant other contact made: Yes-Brother Patient understands diagnosis: Yes. Discussing patient identified problems/goals with staff: Yes. Medical problems stabilized or resolved: Yes. Denies suicidal/homicidal ideation: Yes. Issues/concerns per patient self-inventory: No. Other: n/a  New problem(s) identified: No, Describe:  No new problems identified  New Short Term/Long Term Goal(s): To be discharged to care for his fish and dog  Discharge Plan or Barriers: Return home with [place] [facility] team services  Reason for Continuation of Hospitalization: Anxiety Depression Medication stabilization  Estimated Length of Stay: 1-2 days  Recreational Therapy: Patient Stressors: N/A Patient Goal:  Patient will attend and participate in Recreation Therapy Group Sessions x 5 days.   Attendees: Patient: 06/25/2017 4:28 PM  Physician: Orson Slick, MD 06/25/2017 4:28 PM  Nursing: Polly Cobia, RN 06/25/2017 4:28 PM  RN Care Manager: 06/25/2017 4:28 PM  Social Worker: Dossie Arbour, LCSW 06/25/2017 4:28 PM  Recreational Therapist:  06/25/2017 4:28 PM  Other:  06/25/2017 4:28 PM  Other:  06/25/2017 4:28 PM   Other: 06/25/2017 4:28 PM    Scribe for Treatment Team:  August Saucer, LCSW 06/25/2017 4:28 PM

## 2017-06-26 NOTE — Progress Notes (Signed)
D: Patient is aware of  Discharge this shift .Patient denies suicidal /homicidal ideations. Patient received all belongings brought in  A: No Storage medications. Writer reviewed Discharge Summary, Suicide Risk Assessment, and Transitional Record. Patient also received Prescriptions   from  MD.  Aware  Of follow up appointment . R: Patient left unit with no questions  Or concerns  With taxi home

## 2017-06-26 NOTE — Discharge Summary (Signed)
Physician Discharge Summary Note  Patient:  IDO WOLLMAN is an 52 y.o., male MRN:  [id number] DOB:  Apr 01, 1966 Patient phone:  [phone] (home)  Patient address:   [address] [place] [address],  Total Time spent with patient: 30 minutes  Date of Admission:  06/18/2017 Date of Discharge: 06/26/2017  Reason for Admission:  Psychotic break  Mr. Fadden is a 52 year old male with a history of schizophrenia.  Chief complaint.   History of present illness. Information was obtained from the patient and the chart. The patient was brought to the hospital after he called the police to complain about his neighbors. Apparently, the patient behaved strangely complained of chemical smells and thought there was mischief in the neighborhood. The patient adamantly denies any symptoms of depression, anxiety or depression. His speech is pressured and loud and he is paranoid. He has not been taking his medications at all. He denies substance use.  Past psychiatric history. He responds well to Zyprexa and Haldol. He was hospitalized here in the spring of 2018 and discharged on a combination of Depakote, Zyprexa and Haldol decanoate injection. He usually declines injectables for "religious reasons". He has never been compliant with outpatient treatment. He denies ever attempting suicide.  Family psychiatric history. None reported.  Social history. He is disabled from mental illness. He lives independently in an apartment. There are several legal charges pending for resisting police.  Principal Problem: Undifferentiated schizophrenia [facility]) Discharge Diagnoses: Patient Active Problem List   Diagnosis Date Noted  . Undifferentiated schizophrenia ([place]) [F20.3] 11/04/2016    Priority: High  . Noncompliance with treatment [Z91.19] 04/14/2015   Past Medical History:  Past Medical History:  Diagnosis Date  . Anxiety   . Generalized anxiety disorder   . Schizophrenia [facility])     Past  Surgical History:  Procedure Laterality Date  . TONSILLECTOMY     Family History:  Family History  Family history unknown: Yes    Social History:  Social History   Substance and Sexual Activity  Alcohol Use No  . Alcohol/week: 0.0 oz     Social History   Substance and Sexual Activity  Drug Use No    Social History   Socioeconomic History  . Marital status: Single    Spouse name: None  . Number of children: None  . Years of education: None  . Highest education level: None  Social Needs  . Financial resource strain: None  . Food insecurity - worry: None  . Food insecurity - inability: None  . Transportation needs - medical: None  . Transportation needs - non-medical: None  Occupational History  . None  Tobacco Use  . Smoking status: Former Smoker    Types: Cigarettes    Last attempt to quit: 06/11/1999    Years since quitting: 18.0  . Smokeless tobacco: Never Used  Substance and Sexual Activity  . Alcohol use: No    Alcohol/week: 0.0 oz  . Drug use: No  . Sexual activity: No  Other Topics Concern  . None  Social History Narrative  . None    Hospital Course:    Mr. Fricker is a 52 year old male with a history of schizophrenia admitted floridly psychotic in the context of medication noncompliance. He remains mildly paranoid at the time of discharge. I asked for 90 days of involuntary outpatient psychiatric commitment with [facility] team to improve compliance.  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 Psychiatric: He has a normal mood and affect. His behavior is normal. Thought content normal. His speech is slurred. Cognition and memory are normal. He expresses impulsivity.    Review of Systems  HENT: Positive for hearing loss.   Neurological: Negative.   Psychiatric/Behavioral: Negative.   All other systems reviewed and are negative.   Blood pressure 120/72, pulse 69, temperature 98 F (36.7 C), temperature source Oral, resp. rate 16, height 5\' 6"  (1.676 m), weight 91.6 kg (202 lb), SpO2 95 %.Body mass index is 32.6 kg/m.  General Appearance: Casual  Eye Contact:  Good  Speech:  Slurred  Volume:  Normal  Mood:  Euthymic  Affect:  Blunt  Thought Process:  Goal Directed and Descriptions of Associations: Intact  Orientation:  Full (Time, Place, and Person)  Thought Content:  Paranoid Ideation  Suicidal Thoughts:  No  Homicidal Thoughts:  No  Memory:  Immediate;   Fair Recent;   Fair Remote;   Fair  Judgement:  Poor  Insight:  Lacking  Psychomotor Activity:  Normal  Concentration:  Concentration: Fair and Attention Span: Fair  Recall:  [employer] of Knowledge:  Fair  Language:  Fair  Akathisia:  No  Handed:  Right  AIMS (if indicated):     Assets:  Communication Skills Desire for Improvement Financial Resources/Insurance Housing Physical Health Resilience Social Support  ADL's:  Intact  Cognition:  WNL  Sleep:  Number of Hours: 7.15     Have you used any form of tobacco in the last 30 days? (Cigarettes, Smokeless Tobacco, Cigars, and/or Pipes): No  Has this patient used any form of tobacco in the last 30 days? (Cigarettes, Smokeless Tobacco, Cigars, and/or Pipes) Yes, No  Blood Alcohol level:  Lab Results  Component Value Date   ETH <10 06/18/2017   ETH <5 67/67/2094     Metabolic Disorder Labs:  Lab Results  Component Value Date   HGBA1C 5.4 06/19/2017   MPG 108.28 06/19/2017   MPG 111 11/05/2016   No results found for: PROLACTIN Lab Results  Component Value Date   CHOL 240 (H) 06/19/2017   TRIG 290 (H) 06/19/2017   HDL 36 (L) 06/19/2017   CHOLHDL 6.7 06/19/2017   VLDL 58 (H) 06/19/2017   LDLCALC 146 (H) 06/19/2017   LDLCALC 115 (H) 11/05/2016    See Psychiatric Specialty Exam and Suicide Risk Assessment completed by Attending Physician prior to discharge.  Discharge destination:  Home  Is patient on multiple antipsychotic therapies at discharge:  Yes,   Do you recommend tapering to monotherapy for antipsychotics?  Yes   Has Patient had three or more failed trials of antipsychotic monotherapy by history:  Yes,   Antipsychotic medications that previously failed include:   1.  zyprexa., 2.  risperdal. and 3.  haldol.  Recommended Plan for Multiple Antipsychotic Therapies: Taper to monotherapy as described:  discontinue Zyprexa if possible  Discharge Instructions  Diet - low sodium heart healthy   Complete by:  As directed    Increase activity slowly   Complete by:  As directed      Allergies as of 06/26/2017   No Known Allergies     Medication List    STOP taking these medications   cyclobenzaprine 10 MG tablet Commonly known as:  FLEXERIL   hydrOXYzine 50 MG tablet Commonly known as:  ATARAX/VISTARIL   traZODone 100 MG tablet Commonly known as:  DESYREL     TAKE these medications     Indication  divalproex 500 MG DR tablet Commonly known as:  DEPAKOTE Take 1 tablet (500 mg total) by mouth every 12 (twelve) hours. What changed:    medication strength  how much to take  Indication:  Schizophrenia   haloperidol decanoate 100 MG/ML injection Commonly known as:  HALDOL DECANOATE Inject 1 mL (100 mg total) into the muscle every 30 (thirty) days. Next injection on 12/09/2016.  Indication:  Schizophrenia    OLANZapine 15 MG tablet Commonly known as:  ZYPREXA Take 1 tablet (15 mg total) by mouth at bedtime.  Indication:  Schizophrenia      Follow-up [facility]. Follow up.   Why:  TBD Contact information: [place] [address] [phone]           Follow-up recommendations:  Activity:  as tolerated Diet:  low sodium heart healthy Other:  keep follow up appointments  Comments:     Signed: Orson Slick, MD 06/26/2017, 9:17 AM

## 2017-06-26 NOTE — Progress Notes (Signed)
Recreation Therapy Notes  Date: 01.17.2019  Time: 9:30 am   Location: Craft Room  Behavioral response: N/A  Intervention Topic: Anger  Discussion/Intervention: Patient did not attend group. Clinical Observations/Feedback:  Patient did not attend group.  [name] LRT/CTRS         Benjamin Braun 06/26/2017 10:41 AM

## 2017-06-26 NOTE — Plan of Care (Signed)
Patient slept for Estimated Hours of 7.15; Precautionary checks every 15 minutes for safety maintained, room free of safety hazards, patient sustains no injury or falls during this shift.

## 2017-06-26 NOTE — [facility] Group Notes (Signed)
06/26/2017  Time: 1:00  Type of Therapy/Topic:  Group Therapy:  Balance in Life  Participation Level:  Did Not Attend  Description of Group:   This group will address the concept of balance and how it feels and looks when one is unbalanced. Patients will be encouraged to process areas in their lives that are out of balance and identify reasons for remaining unbalanced. Facilitators will guide patients in utilizing problem-solving interventions to address and correct the stressor making their life unbalanced. Understanding and applying boundaries will be explored and addressed for obtaining and maintaining a balanced life. Patients will be encouraged to explore ways to assertively make their unbalanced needs known to significant others in their lives, using other group members and facilitator for support and feedback.  Therapeutic Goals: 1. Patient will identify two or more emotions or situations they have that consume much of in their lives. 2. Patient will identify signs/triggers that life has become out of balance:  3. Patient will identify two ways to set boundaries in order to achieve balance in their lives:  4. Patient will demonstrate ability to communicate their needs through discussion and/or role plays  Summary of Patient Progress: Pt was invited to attend group but chose not to attend. CSW will continue to encourage pt to attend group throughout their admission.   Therapeutic Modalities:   Cognitive Behavioral Therapy Solution-Focused Therapy Assertiveness Training  Alden Hipp, MSW, LCSW 06/26/2017 1:38 PM

## 2017-06-26 NOTE — B[facility] Group Notes (Signed)
LCSW Group Therapy Note 06/26/2017 9:00 AM  Type of Therapy and Topic:  Group Therapy:  Setting Goals  Participation Level:  Did Not Attend  Description of Group: In this process group, patients discussed using strengths to work toward goals and address challenges.  Patients identified two positive things about themselves and one goal they were working on.  Patients were given the opportunity to share openly and support each other's plan for self-empowerment.  The group discussed the value of gratitude and were encouraged to have a daily reflection of positive characteristics or circumstances.  Patients were encouraged to identify a plan to utilize their strengths to work on current challenges and goals.  Therapeutic Goals 1. Patient will verbalize personal strengths/positive qualities and relate how these can assist with achieving desired personal goals 2. Patients will verbalize affirmation of peers plans for personal change and goal setting 3. Patients will explore the value of gratitude and positive focus as related to successful achievement of goals 4. Patients will verbalize a plan for regular reinforcement of personal positive qualities and circumstances.  Summary of Patient Progress:       Therapeutic Modalities Cognitive Behavioral Therapy Motivational Interviewing    Devona Konig, [place] 06/26/2017 2:44 PM

## 2017-06-26 NOTE — Progress Notes (Signed)
Patient ID: Benjamin Braun, male   DOB: 1966-03-08, 52 y.o.   MRN: [id number] CSW met with Jones Skene TL with [facility] following her screening interview with pt.  [name] informed CSW that pt is appropriate for admission onto the team, but pt informed her that he did not want the services as he is uncomfortable with her coming into his home.  [name] shared that pt was very paranoid during the interview and she had to stop the interview as she did not wish for him to become too excited.  [name] shared that if pt would be open to coming to the office for services to start so he can get use to the team that would be another option.  [name] shared that pt mentioned a brother and inquired if it was a possibility that the brother would bring pt to the office?  [name] informed [name] and CSW that pt's brother works 3rd shift and informed her that he goes to sleep around 11AM so he could not be contacted today.  CSW agreed to make contact with pt's brother on Friday to see if he would be willing or able to assist pt with getting to the office at least for the initial visit.  CSW informed [name] that she would follow-up with her on Friday.

## 2017-06-26 NOTE — Progress Notes (Signed)
  [facility] Adult Case Management Discharge Plan :  Will you be returning to the same living situation after discharge:  Yes,  Pt will be returning to his home At discharge, do you have transportation home?: Yes,  Pt will be using a taxi to get back to his home Do you have the ability to pay for your medications: Yes,  Pt have financial resources to pay for his medications  Release of information consent forms completed and in the chart;  Patient's signature needed at discharge.  Patient to Follow up at: Follow-up [facility]. Follow up on 07/02/2017.   Why:  Please follow-up with [name] with the Assertive Community Treatment Team (ACTT) on 07/02/17 at 10:00AM at the [place] office. Contact information: [place] [phone]           Next level of care provider has access to [place] and Suicide Prevention discussed: Yes,  No safety issues noted  Have you used any form of tobacco in the last 30 days? (Cigarettes, Smokeless Tobacco, Cigars, and/or Pipes): No  Has patient been referred to the Quitline?: N/A patient is not a smoker  Patient has been referred for addiction treatment: [place], LCSW 06/26/2017, 2:29 PM

## 2017-06-26 NOTE — Progress Notes (Signed)
Patient ID: Benjamin Braun, male   DOB: 10-Nov-1965, 52 y.o.   MRN: [id number] Still responding to I/S, talks irrelevantly, nonsensical, easily frustrated, easily angered, HOH, irritable, paranoid still, avoidant, withdrawn from others; anticipating discharge tomorrow.

## 2017-06-26 NOTE — Progress Notes (Signed)
Recreation Therapy Notes  INPATIENT RECREATION TR PLAN  Patient Details Name: Benjamin Braun MRN: [id number] DOB: 05-Feb-1966 Today's Date: 06/26/2017  Rec Therapy Plan Is patient appropriate for Therapeutic Recreation?: Yes Treatment times per week: at least 3 Estimated Length of Stay: 5-7 days TR Treatment/Interventions: Group participation (Comment)  Discharge Criteria Pt will be discharged from therapy if:: Discharged Treatment plan/goals/alternatives discussed and agreed upon by:: Patient/family  Discharge Summary Short term goals set: Patient will attend and participate in Recreation Therapy Group Sessions x 5 days.  Short term goals met: Not met Progress toward goals comments: (None) Reason goals not met: Patient did not attend any groups. Therapeutic equipment acquired: N/A Reason patient discharged from therapy: Discharge from hospital Pt/family agrees with progress & goals achieved: Yes Date patient discharged from therapy: 06/26/17   Shloimy Michalski 06/26/2017, 11:40 AM

## 2017-07-03 DIAGNOSIS — H25813 Combined forms of age-related cataract, bilateral: Secondary | ICD-10-CM | POA: Diagnosis not present

## 2017-07-15 ENCOUNTER — Emergency Department
Admission: EM | Admit: 2017-07-15 | Discharge: 2017-07-16 | Disposition: A | Discharge status: Other Acute Inpatient Hospital | Payer: PPO | Attending: Emergency Medicine | Admitting: Emergency Medicine

## 2017-07-15 DIAGNOSIS — F209 Schizophrenia, unspecified: Secondary | ICD-10-CM

## 2017-07-15 DIAGNOSIS — F203 Undifferentiated schizophrenia: Secondary | ICD-10-CM

## 2017-07-15 DIAGNOSIS — F419 Anxiety disorder, unspecified: Secondary | ICD-10-CM | POA: Insufficient documentation

## 2017-07-15 DIAGNOSIS — Z87891 Personal history of nicotine dependence: Secondary | ICD-10-CM | POA: Insufficient documentation

## 2017-07-15 DIAGNOSIS — Z9119 Patient's noncompliance with other medical treatment and regimen: Secondary | ICD-10-CM | POA: Diagnosis not present

## 2017-07-15 DIAGNOSIS — Z008 Encounter for other general examination: Secondary | ICD-10-CM | POA: Diagnosis present

## 2017-07-15 DIAGNOSIS — Z91199 Patient's noncompliance with other medical treatment and regimen due to unspecified reason: Secondary | ICD-10-CM

## 2017-07-15 DIAGNOSIS — Z79899 Other long term (current) drug therapy: Secondary | ICD-10-CM | POA: Insufficient documentation

## 2017-07-15 LAB — COMPREHENSIVE METABOLIC PANEL
ALT: 27 U/L (ref 17–63)
AST: 27 U/L (ref 15–41)
Albumin: 4 g/dL (ref 3.5–5.0)
Alkaline Phosphatase: 106 U/L (ref 38–126)
Anion gap: 9 (ref 5–15)
BUN: 15 mg/dL (ref 6–20)
CHLORIDE: 103 mmol/L (ref 101–111)
CO2: 26 mmol/L (ref 22–32)
CREATININE: 1.06 mg/dL (ref 0.61–1.24)
Calcium: 9.2 mg/dL (ref 8.9–10.3)
GFR calc Af Amer: >60 mL/min (ref >60)
GFR calc non Af Amer: >60 mL/min (ref >60)
Glucose, Bld: 137 mg/dL — ABNORMAL HIGH (ref 65–99)
Potassium: 3.9 mmol/L (ref 3.5–5.1)
SODIUM: 138 mmol/L (ref 135–145)
Total Bilirubin: 0.5 mg/dL (ref 0.3–1.2)
Total Protein: 7.1 g/dL (ref 6.5–8.1)

## 2017-07-15 LAB — CBC
HCT: 44.7 % (ref 40.0–52.0)
Hemoglobin: 15.3 g/dL (ref 13.0–18.0)
MCH: 29 pg (ref 26.0–34.0)
MCHC: 34.2 g/dL (ref 32.0–36.0)
MCV: 84.9 fL (ref 80.0–100.0)
Platelets: 246 10*3/uL (ref 150–440)
RBC: 5.26 MIL/uL (ref 4.40–5.90)
RDW: 13.2 % (ref 11.5–14.5)
WBC: 10.9 10*3/uL — ABNORMAL HIGH (ref 3.8–10.6)

## 2017-07-15 LAB — ACETAMINOPHEN LEVEL: Acetaminophen (Tylenol), Serum: <10 ug/mL — ABNORMAL LOW (ref 10–30)

## 2017-07-15 LAB — SALICYLATE LEVEL

## 2017-07-15 LAB — ETHANOL: Alcohol, Ethyl (B): <10 mg/dL (ref <10)

## 2017-07-15 MED ORDER — DIVALPROEX SODIUM 500 MG PO DR TAB
500.0000 mg | DELAYED_RELEASE_TABLET | Freq: Two times a day (BID) | ORAL | Status: DC
  Administered 2017-07-15 – 2017-07-16 (×2): 500 mg via ORAL
  Filled 2017-07-15 (×2): qty 1

## 2017-07-15 MED ORDER — OLANZAPINE 5 MG PO TABS
15.0000 mg | ORAL_TABLET | Freq: Every day | ORAL | Status: DC
  Administered 2017-07-15: 21:00:00 15 mg via ORAL
  Filled 2017-07-15: qty 1

## 2017-07-15 NOTE — ED Notes (Signed)

## 2017-07-15 NOTE — ED Notes (Signed)
"  Ever since I moved in to my apartment - [name] has been trying to break in to my apartment - I have seen him picking the lock - I have even seen him walking down the hall of my apartment with nothing but my bathrobe on - all the wives in my complex cannot stand there husbands but I am not sleeping with them women - I have seen them smoking crack - I know mike Edison Pace is on morphine cause his skin is brown like morphine does"   Pt with rambling speech -  "I have been taking my olanzapine but I did not take my valproic acid last night cause I have a toothache"

## 2017-07-15 NOTE — ED Notes (Signed)
Pt  IVC  FROM  [facility]

## 2017-07-15 NOTE — ED Notes (Signed)
Report to include situation, background, assessment and recommendations from [facility]. Patient sleeping, respirations regular and unlabored. Q15 minute rounds and security camera observation to continue.

## 2017-07-15 NOTE — ED Notes (Signed)
Pt IVC/ pending placement  

## 2017-07-15 NOTE — ED Notes (Signed)
Sandwich and soft drink given.  

## 2017-07-15 NOTE — ED Notes (Signed)
Patient alert and oriented, warm and dry, in no acute distress. Patient denies SI, HI, AVH and pain. Patient made aware of Q15 minute rounds and security cameras for their safety. Patient instructed to come to me with needs or concerns. 

## 2017-07-15 NOTE — ED Triage Notes (Signed)
Pt brought in by [employer] from home.  Reported to [employer] that patient wants to kill dogs and refusing to take his medication.  Pt is hyperverbal in triage and not making a lot of since.  Pt is cooperating during triage.  Pt denies SI/HI.

## 2017-07-15 NOTE — ED Provider Notes (Signed)
[facility] Emergency Department Provider Note   ____________________________________________    I have reviewed the triage vital signs and the nursing notes.   HISTORY  Chief Complaint Psychiatric Evaluation     HPI Benjamin Braun is a 52 y.o. male who was sent in from [facility] under involuntary commitment for evaluation.  Reportedly the patient has been making many nonsensical statements and apparently threatening to kill dogs and has been off of his medication.  With the triage nurse he seemed quite paranoid.  However with me he does not want to answer any questions or speak to me at all.   Past Medical History:  Diagnosis Date  . Anxiety   . Generalized anxiety disorder   . Schizophrenia [facility])     Patient Active Problem List   Diagnosis Date Noted  . 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 Noncompliance with treatment 04/14/2015    Past Surgical History:  Procedure Laterality Date  . TONSILLECTOMY      Prior to Admission medications   Medication Sig Start Date End Date Taking? Authorizing Provider  divalproex (DEPAKOTE) 500 MG DR tablet Take 1 tablet (500 mg total) by mouth every 12 (twelve) hours. 06/25/17   Pucilowska, Herma Ard B, MD  haloperidol decanoate (HALDOL DECANOATE) 100 MG/ML injection Inject 1 mL (100 mg total) into the muscle every 30 (thirty) days. Next injection on 12/09/2016. 06/25/17   Pucilowska, Jolanta B, MD  OLANZapine (ZYPREXA) 15 MG tablet Take 1 tablet (15 mg total) by mouth at bedtime. 06/25/17   Pucilowska, Wardell Honour, MD     Allergies Patient has no known allergies.  Family History  Family history unknown: Yes    Social History Social History   Tobacco Use  . Smoking status: Former Smoker    Types: Cigarettes    Last attempt to quit: 06/11/1999    Years since quitting: 18.1  . Smokeless tobacco: Never Used  Substance Use Topics  . Alcohol use: No    Alcohol/week: 0.0 oz  . Drug use: No     Level 5 caveat: Unable to obtain review of Systems as patient will not answer questions     ____________________________________________   PHYSICAL EXAM:  VITAL SIGNS: ED Triage Vitals  Enc Vitals Group     BP 07/15/17 1327 (!) 167/96     Pulse Rate 07/15/17 1327 (!) 114     Resp 07/15/17 1327 18     Temp 07/15/17 1327 98.1 F (36.7 C)     Temp Source 07/15/17 1327 Oral     SpO2 07/15/17 1327 97 %     Weight 07/15/17 1342 91.2 kg (201 lb)     Height --      Head Circumference --      Peak Flow --      Pain Score 07/15/17 1405 0     Pain Loc --      Pain Edu? --      Excl. in [place]? --     Constitutional: Alert and oriented. Eyes: Conjunctivae are normal.   Nose: No congestion/rhinnorhea. Mouth/Throat: Mucous membranes are moist.    Cardiovascular: Normal rate, regular rhythm.   Good peripheral circulation. Respiratory: Normal respiratory effort.  No retractions. Lungs CTAB. Gastrointestinal: Soft and nontender. No distention.   Genitourinary: deferred Musculoskeletal:  Warm and well perfused Neurologic:  Normal speech and language. No gross focal neurologic deficits are appreciated.  Skin:  Skin is warm, dry and intact. No rash noted. Psychiatric: Flat affect,  uncooperative ____________________________________________   LABS (all labs ordered are listed, but only abnormal results are displayed)  Labs Reviewed  COMPREHENSIVE METABOLIC PANEL - Abnormal; Notable for the following components:      Result Value   Glucose, Bld 137 (*)    All other components within normal limits  ACETAMINOPHEN LEVEL - Abnormal; Notable for the following components:   Acetaminophen (Tylenol), Serum <10 (*)    All other components within normal limits  CBC - Abnormal; Notable for the following components:   WBC 10.9 (*)    All other components within normal limits  ETHANOL  SALICYLATE LEVEL  URINE DRUG SCREEN, QUALITATIVE ([facility] ONLY)    ____________________________________________  EKG   ____________________________________________  RADIOLOGY None ____________________________________________   PROCEDURES  Procedure(s) performed: No  Procedures   Critical Care performed: No ____________________________________________   INITIAL IMPRESSION / ASSESSMENT AND PLAN / ED COURSE  Pertinent labs & imaging results that were available during my care of the patient were reviewed by me and considered in my medical decision making (see chart for details).  Patient presents under IVC from [facility].  Patient medically cleared for psychiatric evaluation.  Will consult [name] and TTS    ____________________________________________   FINAL CLINICAL IMPRESSION(S) / ED DIAGNOSES  Final diagnoses:  Schizophrenia, unspecified type ([place])        Note:  This document was prepared using Dragon voice recognition software and may include unintentional dictation errors.    Lavonia Drafts, MD 07/15/17 1452

## 2017-07-15 NOTE — ED Notes (Signed)
Hourly rounding reveals patient sleeping in room. No complaints, stable, in no acute distress. Q15 minute rounds and monitoring via Security Cameras to continue. 

## 2017-07-15 NOTE — BH Assessment (Signed)
Assessment Note  Benjamin Braun is an 52 y.o. male who presents to the ER after being seen at [facility]), after law enforcement had concerns from his multiple phone calls to 911. For the last several days he has call 911 reporting things that wasn't true. While at [facility], patient speech was incoherent and was able to follow his flow of thought.  Writer made attempts to engage with the patient but he was too psychotic to continue interview.  Patient is well known to the ER due to similar past visits. He is known for being non-compliant with taking his medications.  Diagnosis: Schizophrenia  Past Medical History:  Past Medical History:  Diagnosis Date  . Anxiety   . Generalized anxiety disorder   . Schizophrenia [facility])     Past Surgical History:  Procedure Laterality Date  . TONSILLECTOMY      Family History:  Family History  Family history unknown: Yes    Social History:  reports that he quit smoking about 18 years ago. His smoking use included cigarettes. he has never used smokeless tobacco. He reports that he does not drink alcohol or use drugs.  Additional Social History:  Alcohol / Drug Use Pain Medications: See PTA  Prescriptions: See PTA  Over the Counter: See PTA  History of alcohol / drug use?: No history of alcohol / drug abuse Longest period of sobriety (when/how long): n/a Negative Consequences of Use: (n/a) Withdrawal Symptoms: (n/a)  CIWA: CIWA-Ar BP: (!) 167/96 Pulse Rate: (!) 114 COWS:    Allergies: No Known Allergies  Home Medications:  (Not in a hospital admission)  OB/GYN Status:  No LMP for male patient.  General Assessment Data Location of Assessment: [facility] ED TTS Assessment: In system Is this a Tele or Face-to-Face Assessment?: Face-to-Face Is this an Initial Assessment or a Re-assessment for this encounter?: Initial Assessment Marital status: Single Maiden name: n/a Is patient pregnant?: No Pregnancy Status: No Living  Arrangements: Alone Can pt return to current living arrangement?: Yes Admission Status: Involuntary Is patient capable of signing voluntary admission?: No Referral Source: Other Insurance type: [employer] Adv.  Medical Screening Exam ([place]) Medical Exam completed: Yes  Crisis Care Plan Living Arrangements: Alone Legal Guardian: Other:(Self) Name of Psychiatrist: [facility] Name of Therapist: [facility]  Education Status Is patient currently in school?: No Current Grade: n/a Highest grade of school patient has completed: n/a Name of school: n/a Contact person: n/a  Risk to self with the past 6 months Suicidal Ideation: No Has patient been a risk to self within the past 6 months prior to admission? : No Suicidal Intent: No Has patient had any suicidal intent within the past 6 months prior to admission? : No Is patient at risk for suicide?: No Suicidal Plan?: No Has patient had any suicidal plan within the past 6 months prior to admission? : No Access to Means: No What has been your use of drugs/alcohol within the last 12 months?: Reports of none Previous Attempts/Gestures: No How many times?: 0 Other Self Harm Risks: Reports of none Triggers for Past Attempts: None known Intentional Self Injurious Behavior: None Family Suicide History: No Recent stressful life event(s): Other (Comment)(not taking medications) Persecutory voices/beliefs?: No Depression: No Depression Symptoms: Isolating Substance abuse history and/or treatment for substance abuse?: No Suicide prevention information given to non-admitted patients: Not applicable  Risk to Others within the past 6 months Homicidal Ideation: No Does patient have any lifetime risk of violence toward  others beyond the six months prior to admission? : No Thoughts of Harm to Others: No Current Homicidal Intent: No Current Homicidal Plan: No Access to Homicidal Means: No Identified Victim: Reports of none History of  harm to others?: No Assessment of Violence: None Noted Violent Behavior Description: Reports of none Does patient have access to weapons?: No Criminal Charges Pending?: No Does patient have a court date: No Is patient on probation?: No  Psychosis Hallucinations: None noted Delusions: None noted  Mental Status Report Appearance/Hygiene: Unremarkable, In scrubs Eye Contact: Poor Motor Activity: Freedom of movement, Unremarkable, Restlessness Speech: Pressured, Word salad Level of Consciousness: Alert Mood: Labile, Pleasant Affect: Labile Anxiety Level: Minimal Thought Processes: Irrelevant, Flight of Ideas, Tangential Judgement: Impaired Orientation: Person, Place Obsessive Compulsive Thoughts/Behaviors: Moderate  Cognitive Functioning Concentration: Decreased Memory: Remote Impaired, Recent Impaired IQ: Average Insight: Poor Impulse Control: Fair Appetite: (UTA) Weight Loss: (UTA) Weight Gain: (UTA) Sleep: Decreased Total Hours of Sleep: (UTA) Vegetative Symptoms: Unable to Assess  ADLScreening [facility] Assessment Services) Patient's cognitive ability adequate to safely complete daily activities?: Yes Patient able to express need for assistance with ADLs?: Yes Independently performs ADLs?: Yes (appropriate for developmental age)  Prior Inpatient Therapy Prior Inpatient Therapy: Yes Prior Therapy Dates: 06/2017, 10/2016, 04/2015 & 04/2014, Prior Therapy Facilty/Provider(s): [facility] Reason for Treatment: Psychosis  Prior Outpatient Therapy Prior Outpatient Therapy: Yes Prior Therapy Dates: Current Prior Therapy Facilty/Provider(s): [facility] Reason for Treatment: Psychosis Does patient have an ACCT team?: No Does patient have Intensive In-House Services?  : No Does patient have [facility] services? : No Does patient have P4CC services?: No  ADL Screening (condition at time of admission) Patient's cognitive ability adequate to safely complete daily activities?:  Yes Is the patient deaf or have difficulty hearing?: No Does the patient have difficulty seeing, even when wearing glasses/contacts?: No Does the patient have difficulty concentrating, remembering, or making decisions?: No Patient able to express need for assistance with ADLs?: Yes Does the patient have difficulty dressing or bathing?: No Independently performs ADLs?: Yes (appropriate for developmental age) Does the patient have difficulty walking or climbing stairs?: No Weakness of Legs: None Weakness of Arms/Hands: None  Home Assistive Devices/Equipment Home Assistive Devices/Equipment: None  Therapy Consults (therapy consults require a physician order) PT Evaluation Needed: No OT Evalulation Needed: No SLP Evaluation Needed: No Abuse/Neglect Assessment (Assessment to be complete while patient is alone) Abuse/Neglect Assessment Can Be Completed: Yes Physical Abuse: Denies Verbal Abuse: Denies Sexual Abuse: Denies Exploitation of patient/patient's resources: Denies Self-Neglect: Denies Values / Beliefs Cultural Requests During Hospitalization: None Spiritual Requests During Hospitalization: None Consults Spiritual Care Consult Needed: No Social Work Consult Needed: No Regulatory affairs officer (For Healthcare) Does Patient Have a Medical Advance Directive?: No Would patient like information on creating a medical advance directive?: No - Patient declined    Additional Information 1:1 In Past 12 Months?: No CIRT Risk: No Elopement Risk: No Does patient have medical clearance?: Yes  Child/Adolescent Assessment Running Away Risk: Denies(Pateint is an adult)  Disposition:  Disposition Initial Assessment Completed for this Encounter: Yes Disposition of Patient: Inpatient treatment program(Per [name])  On Site Evaluation by:   Reviewed with Physician:    Gunnar Fusi MS, LCAS, LPC, [place], CCSI Therapeutic Triage Specialist 07/15/2017 6:12 PM

## 2017-07-15 NOTE — Consult Note (Addendum)
[facility] Consult   Reason for Consult: Consult for 52 year old man with a history of schizophrenia brought in under involuntary commitment papers filed at [facility] Referring Physician:  Haig Prophet Patient Identification: Benjamin Braun MRN:  [id number] Principal Diagnosis: Undifferentiated schizophrenia [facility]) Diagnosis:   Patient Active Problem List   Diagnosis Date Noted  . Undifferentiated schizophrenia ([place]) [F20.3] 11/04/2016  . Noncompliance with treatment [Z91.19] 04/14/2015    Total Time spent with patient: 1 hour  Subjective:   Benjamin Braun is a 52 y.o. male patient admitted with "Benjamin Braun is walking around in my clothes".  HPI: 51 year old gentleman with a known history of schizophrenia was brought in under IVC.  The fax appear to be pretty similar to what we have seen in the past.  He was calling the police multiple times a day to complain about somebody that he thought was stalking him or breaking into his apartment door somehow harassing him.  His description of it is of course disorganized and very difficult to follow.  Evidently the police quickly determined that there was no actual threat going on.  Ultimately they detained the patient and brought him in to [facility] for assessment.  Patient admits that he has not been taking his "nerve medicine".  He is a little evasive with me about how often he does take it claiming that he does take it every now and then but then he will miss it at times as well.  Patient is difficult to redirect to get much other history.  Not describing any other new stressor.  Denies any alcohol or drug use.  Not reporting any suicidal or homicidal ideas he actually told me that he wanted to be on his nerve medicine in contrast to how noncompliant he tends to be as an outpatient.  Medical history: Patient is in pretty good health from a medical standpoint and does not have any medical illnesses outside of his mental health.  Social history: Lives  independently.  Does not seem to have a lot of social contact.  He is supposed to have an act team following him.  Substance abuse history: Does not have a significant history of substance abuse problems  Past Psychiatric History: Long history of schizophrenia multiple hospitalizations including having been here in our hospital just a couple weeks ago.  Patient denies any history of suicide attempts.  Most of his problem behaviors and hospitalizations come from his paranoia that drives him to create trouble for local law enforcement officials.  He does well on Risperdal and Depakote and he is quite aware of that and is able to "the doses to me but apparently has once again been noncompliant.  Risk to Self: Is patient at risk for suicide?: No Risk to Others:   Prior Inpatient Therapy:   Prior Outpatient Therapy:    Past Medical History:  Past Medical History:  Diagnosis Date  . Anxiety   . Generalized anxiety disorder   . Schizophrenia [facility])     Past Surgical History:  Procedure Laterality Date  . TONSILLECTOMY     Family History:  Family History  Family history unknown: Yes   Family Psychiatric  History: Does not know of any Social History:  Social History   Substance and Sexual Activity  Alcohol Use No  . Alcohol/week: 0.0 oz     Social History   Substance and Sexual Activity  Drug Use No    Social History   Socioeconomic History  . Marital status: Single  Spouse name: None  . Number of children: None  . Years of education: None  . Highest education level: None  Social Needs  . Financial resource strain: None  . Food insecurity - worry: None  . Food insecurity - inability: None  . Transportation needs - medical: None  . Transportation needs - non-medical: None  Occupational History  . None  Tobacco Use  . Smoking status: Former Smoker    Types: Cigarettes    Last attempt to quit: 06/11/1999    Years since quitting: 18.1  . Smokeless tobacco: Never Used   Substance and Sexual Activity  . Alcohol use: No    Alcohol/week: 0.0 oz  . Drug use: No  . Sexual activity: No  Other Topics Concern  . None  Social History Narrative  . None   Additional Social History:    Allergies:  No Known Allergies  Labs:  Results for orders placed or performed during the hospital encounter of 07/15/17 (from the past 48 hour(s))  Comprehensive metabolic panel     Status: Abnormal   Collection Time: 07/15/17  1:00 PM  Result Value Ref Range   Sodium 138 135 - 145 mmol/L   Potassium 3.9 3.5 - 5.1 mmol/L   Chloride 103 101 - 111 mmol/L   CO2 26 22 - 32 mmol/L   Glucose, Bld 137 (H) 65 - 99 mg/dL   BUN 15 6 - 20 mg/dL   Creatinine, Ser 1.06 0.61 - 1.24 mg/dL   Calcium 9.2 8.9 - 10.3 mg/dL   Total Protein 7.1 6.5 - 8.1 g/dL   Albumin 4.0 3.5 - 5.0 g/dL   AST 27 15 - 41 U/L   ALT 27 17 - 63 U/L   Alkaline Phosphatase 106 38 - 126 U/L   Total Bilirubin 0.5 0.3 - 1.2 mg/dL   GFR calc non Af Amer >60 >60 mL/min   GFR calc Af Amer >60 >60 mL/min    Comment: (NOTE) The eGFR has been calculated using the CKD EPI equation. This calculation has not been validated in all clinical situations. eGFR's persistently <60 mL/min signify possible Chronic Kidney Disease.    Anion gap 9 5 - 15    Comment: Performed at [facility], [address] [place], [place] [address]  Ethanol     Status: None   Collection Time: 07/15/17  1:00 PM  Result Value Ref Range   Alcohol, Ethyl (B) <10 <10 mg/dL    Comment:        LOWEST DETECTABLE LIMIT FOR SERUM ALCOHOL IS 10 mg/dL FOR MEDICAL PURPOSES ONLY Performed at [facility], [address] [place], [place] [address]   Salicylate level     Status: None   Collection Time: 07/15/17  1:00 PM  Result Value Ref Range   Salicylate Lvl <3.3 2.8 - 30.0 mg/dL    Comment: Performed at [facility], [address] [place], [place] [address]  Acetaminophen level     Status: Abnormal   Collection  Time: 07/15/17  1:00 PM  Result Value Ref Range   Acetaminophen (Tylenol), Serum <10 (L) 10 - 30 ug/mL    Comment:        THERAPEUTIC CONCENTRATIONS VARY SIGNIFICANTLY. A RANGE OF 10-30 ug/mL MAY BE AN EFFECTIVE CONCENTRATION FOR MANY PATIENTS. HOWEVER, SOME ARE BEST TREATED AT CONCENTRATIONS OUTSIDE THIS RANGE. ACETAMINOPHEN CONCENTRATIONS >150 ug/mL AT 4 HOURS AFTER INGESTION AND >50 ug/mL AT 12 HOURS AFTER INGESTION ARE OFTEN ASSOCIATED WITH TOXIC REACTIONS.  Performed at [facility], [address] [place], [place] [address]   cbc     Status: Abnormal   Collection Time: 07/15/17  1:00 PM  Result Value Ref Range   WBC 10.9 (H) 3.8 - 10.6 K/uL   RBC 5.26 4.40 - 5.90 MIL/uL   Hemoglobin 15.3 13.0 - 18.0 g/dL   HCT 44.7 40.0 - 52.0 %   MCV 84.9 80.0 - 100.0 fL   MCH 29.0 26.0 - 34.0 pg   MCHC 34.2 32.0 - 36.0 g/dL   RDW 13.2 11.5 - 14.5 %   Platelets 246 150 - 440 K/uL    Comment: Performed at [facility], [address], [place], [place] [address]    Current Facility-Administered Medications  Medication Dose Route Frequency Provider Last Rate Last Dose  . divalproex (DEPAKOTE) DR tablet 500 mg  500 mg Oral Q12H Jodi Kappes T, MD      . OLANZapine (ZYPREXA) tablet 15 mg  15 mg Oral QHS Twania Bujak, [name], MD       Current Outpatient Medications  Medication Sig Dispense Refill  . divalproex (DEPAKOTE) 500 MG DR tablet Take 1 tablet (500 mg total) by mouth every 12 (twelve) hours. 60 tablet 1  . haloperidol decanoate (HALDOL DECANOATE) 100 MG/ML injection Inject 1 mL (100 mg total) into the muscle every 30 (thirty) days. Next injection on 12/09/2016. 1 mL 1  . OLANZapine (ZYPREXA) 15 MG tablet Take 1 tablet (15 mg total) by mouth at bedtime. 30 tablet 1    Musculoskeletal: Strength & Muscle Tone: within normal limits Gait & Station: normal Patient leans: N/A  Psychiatric Specialty Exam: Physical Exam  Nursing note and vitals  reviewed. Constitutional: He appears well-developed and well-nourished.  HENT:  Head: Normocephalic and atraumatic.  Eyes: Conjunctivae are normal. Pupils are equal, round, and reactive to light.  Neck: Normal range of motion.  Cardiovascular: Regular rhythm and normal heart sounds.  Respiratory: Effort normal. No respiratory distress.  GI: Soft.  Musculoskeletal: Normal range of motion.  Neurological: He is alert.  Skin: Skin is warm and dry.  Psychiatric: His affect is labile. His speech is rapid and/or pressured. He is agitated. He is not aggressive. Thought content is paranoid. Cognition and memory are impaired. He expresses impulsivity. He expresses no homicidal and no suicidal ideation. He exhibits abnormal recent memory.    Review of Systems  Constitutional: Negative.   HENT: Negative.   Eyes: Negative.   Respiratory: Negative.   Cardiovascular: Negative.   Gastrointestinal: Negative.   Musculoskeletal: Negative.   Skin: Negative.   Neurological: Negative.   Psychiatric/Behavioral: Negative for depression, hallucinations, memory loss, substance abuse and suicidal ideas. The patient is not nervous/anxious and does not have insomnia.     Blood pressure (!) 167/96, pulse (!) 114, temperature 98.1 F (36.7 C), temperature source Oral, resp. rate 18, weight 91.2 kg (201 lb), SpO2 97 %.Body mass index is 32.44 kg/m.  General Appearance: Casual  Eye Contact:  Good  Speech:  Pressured  Volume:  Increased  Mood:  Euthymic  Affect:  Constricted  Thought Process:  Disorganized  Orientation:  Full (Time, Place, and Person)  Thought Content:  Illogical, Delusions, Paranoid Ideation, Rumination and Tangential  Suicidal Thoughts:  No  Homicidal Thoughts:  No  Memory:  Immediate;   Good Recent;   Fair Remote;   Fair  Judgement:  Impaired  Insight:  Lacking  Psychomotor Activity:  Normal  Concentration:  Concentration: Fair  Recall:  [place] of Knowledge:  Fair  Language:   Fair  Akathisia:  No  Handed:  Right  AIMS (if indicated):     Assets:  Housing Physical Health  ADL's:  Intact  Cognition:  WNL  Sleep:        Treatment Plan Summary: Daily contact with patient to assess and evaluate symptoms and progress in treatment, Medication management and Plan This is a 52 year old man well-known to the psychiatric service who has schizophrenia and a long history of unfortunate noncompliance.  Once again it appears that he has made a past of himself to the authorities to the degree that they were either going to arrest him or bring him in for mental health treatment.  When he first arrived in the emergency room apparently he was being non-cooperative with the emergency room doctor but he talked quite a bit to me although it was hard to redirect him to answer specific questions.  Patient will be admitted to the psychiatric unit or referred out but kept under IVC.  Restart olanzapine 15 mg at night and Depakote 500 twice a day.  Disposition: Recommend psychiatric Inpatient admission when medically cleared.  Alethia Berthold, MD 07/15/2017 5:01 PM

## 2017-07-15 NOTE — ED Notes (Signed)
IVC/Moved to [facility]

## 2017-07-15 NOTE — ED Notes (Signed)
Hourly rounding reveals patient in room. No complaints, stable, in no acute distress. Q15 minute rounds and monitoring via Security Cameras to continue. 

## 2017-07-15 NOTE — ED Notes (Signed)
BEHAVIORAL HEALTH ROUNDING Patient sleeping: No. Patient alert and oriented: yes Behavior appropriate: Yes.  ; If no, describe:  Nutrition and fluids offered: yes Toileting and hygiene offered: Yes  Sitter present: q15 minute observations and security  monitoring Law enforcement present: Yes  ODS  

## 2017-07-16 ENCOUNTER — Inpatient Hospital Stay
Admission: AD | Admit: 2017-07-16 | Discharge: 2017-08-08 | DRG: 885 | Disposition: A | Discharge status: Home / Self Care | Payer: PPO | Attending: Psychiatry | Admitting: Psychiatry

## 2017-07-16 ENCOUNTER — Other Ambulatory Visit: Payer: Self-pay

## 2017-07-16 DIAGNOSIS — Z9119 Patient's noncompliance with other medical treatment and regimen: Secondary | ICD-10-CM

## 2017-07-16 DIAGNOSIS — K047 Periapical abscess without sinus: Secondary | ICD-10-CM | POA: Diagnosis not present

## 2017-07-16 DIAGNOSIS — F203 Undifferentiated schizophrenia: Secondary | ICD-10-CM | POA: Diagnosis not present

## 2017-07-16 DIAGNOSIS — F209 Schizophrenia, unspecified: Secondary | ICD-10-CM | POA: Diagnosis not present

## 2017-07-16 DIAGNOSIS — Y92230 Patient room in hospital as the place of occurrence of the external cause: Secondary | ICD-10-CM | POA: Diagnosis not present

## 2017-07-16 DIAGNOSIS — Z91199 Patient's noncompliance with other medical treatment and regimen due to unspecified reason: Secondary | ICD-10-CM

## 2017-07-16 DIAGNOSIS — R4781 Slurred speech: Secondary | ICD-10-CM | POA: Diagnosis not present

## 2017-07-16 DIAGNOSIS — T424X5A Adverse effect of benzodiazepines, initial encounter: Secondary | ICD-10-CM | POA: Diagnosis not present

## 2017-07-16 DIAGNOSIS — Z87891 Personal history of nicotine dependence: Secondary | ICD-10-CM

## 2017-07-16 DIAGNOSIS — F411 Generalized anxiety disorder: Secondary | ICD-10-CM | POA: Diagnosis present

## 2017-07-16 DIAGNOSIS — H9190 Unspecified hearing loss, unspecified ear: Secondary | ICD-10-CM | POA: Diagnosis not present

## 2017-07-16 LAB — URINE DRUG SCREEN, QUALITATIVE (ARMC ONLY)
Amphetamines, Ur Screen: NOT DETECTED
Barbiturates, Ur Screen: NOT DETECTED
Benzodiazepine, Ur Scrn: NOT DETECTED
CANNABINOID 50 NG, UR ~~LOC~~: NOT DETECTED
COCAINE METABOLITE, UR ~~LOC~~: NOT DETECTED
MDMA (Ecstasy)Ur Screen: NOT DETECTED
METHADONE SCREEN, URINE: NOT DETECTED
Opiate, Ur Screen: NOT DETECTED
Phencyclidine (PCP) Ur S: NOT DETECTED
TRICYCLIC, UR SCREEN: POSITIVE — AB

## 2017-07-16 MED ORDER — ACETAMINOPHEN 325 MG PO TABS
650.0000 mg | ORAL_TABLET | Freq: Four times a day (QID) | ORAL | Status: DC | PRN
  Administered 2017-07-17 – 2017-08-08 (×8): 650 mg via ORAL
  Filled 2017-07-16 (×9): qty 2

## 2017-07-16 MED ORDER — DIVALPROEX SODIUM 500 MG PO DR TAB
500.0000 mg | DELAYED_RELEASE_TABLET | Freq: Two times a day (BID) | ORAL | Status: DC
  Administered 2017-07-16 – 2017-08-08 (×41): 500 mg via ORAL
  Filled 2017-07-16 (×41): qty 1

## 2017-07-16 MED ORDER — ALUM & MAG HYDROXIDE-SIMETH 200-200-20 MG/5ML PO SUSP: 30.0000 mL | ORAL | Status: DC | PRN

## 2017-07-16 MED ORDER — OLANZAPINE 5 MG PO TABS
15.0000 mg | ORAL_TABLET | Freq: Every day | ORAL | Status: DC
  Administered 2017-07-16 – 2017-07-19 (×4): 15 mg via ORAL
  Filled 2017-07-16 (×4): qty 1

## 2017-07-16 MED ORDER — HALOPERIDOL DECANOATE 100 MG/ML IM SOLN
100.0000 mg | INTRAMUSCULAR | Status: DC
  Administered 2017-07-17: 100 mg via INTRAMUSCULAR
  Filled 2017-07-16 (×2): qty 1

## 2017-07-16 MED ORDER — MAGNESIUM HYDROXIDE 400 MG/5ML PO SUSP: 30.0000 mL | Freq: Every day | ORAL | Status: DC | PRN

## 2017-07-16 MED ORDER — HALOPERIDOL 5 MG PO TABS
10.0000 mg | ORAL_TABLET | Freq: Every day | ORAL | Status: DC
  Administered 2017-07-16 – 2017-07-27 (×12): 10 mg via ORAL
  Filled 2017-07-16 (×14): qty 2

## 2017-07-16 NOTE — ED Notes (Signed)
Hourly rounding reveals patient sleeping in room. No complaints, stable, in no acute distress. Q15 minute rounds and monitoring via Security Cameras to continue. 

## 2017-07-16 NOTE — Progress Notes (Signed)
The patient is admitted to room 307 with the diagnosis of undifferentiated Schizophrenia. Alert and oriented x 4 but disorganized thinking. No acute distress noted. Patient denies any acute pain. Skin assessment done with Benjamin Bandy RN, noted one tattoo on right upper arm. Patient belongings searched and no contraband found on him or his belongings. Patient was very psychotic during admission questionaire and  keep on saying, " [name] is always after me trying to get me." patient redirected. 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 Patient oriented to his room, staff and the unit. Will continue camera surveillance and 15 minutes checks.

## 2017-07-16 NOTE — [facility] Group Notes (Signed)
[facility] Group Notes:  (Nursing/MHT/Case Management/Adjunct)  Date:  07/16/2017  Time:  9:38 PM  Type of Therapy:  Group Therapy  Participation Level:  Active  Participation Quality:  Appropriate  Affect:  Appropriate  Cognitive:  Alert  Insight:  Good  Engagement in Group:  Engaged  Modes of Intervention:  Support  Summary of Progress/Problems:  Benjamin Braun 07/16/2017, 9:38 PM

## 2017-07-16 NOTE — Progress Notes (Signed)
D: Pt denies SI/HI/AVH. Pt is pleasant and cooperative most times. Pt presents with disorganized thoughts and flight of ideas at times. Pt forwards little to Probation officer . Pt seen on the unit minimally interacting with peers.   A: Pt was offered support and encouragement. Pt was given scheduled medications. Pt was encourage to attend groups. Q 15 minute checks were done for safety.  R: safety maintained on unit.

## 2017-07-16 NOTE — BH Assessment (Signed)
Patient is to be admitted to [facility] by [name].  Attending Physician will be [name].   Patient has been assigned to room 307, by [place].   Intake Paper Work has been signed and placed on patient chart.  ER staff is aware of the admission:  Anne Ng, ER Dian Situ   Dr.Williams  , ER MD   Nicoletta Dress, Patient's Nurse   Genella Rife, Patient Access.

## 2017-07-16 NOTE — Tx Team (Addendum)
Initial Treatment Plan 07/16/2017 5:03 PM Benjamin Braun BPZ:[id number]    PATIENT STRESSORS: Medication change or noncompliance   PATIENT STRENGTHS: Ability for insight Average or above average intelligence General fund of knowledge Motivation for treatment/growth   PATIENT IDENTIFIED PROBLEMS:    Patient stated, "make my medicine straight."   Per his statemnt, "Trazadone make me irritable."                        DISCHARGE CRITERIA:  Ability to meet basic life and health needs Adequate post-discharge living arrangements Improved stabilization in mood, thinking, and/or behavior Motivation to continue treatment in a less acute level of care Reduction of life-threatening or endangering symptoms to within safe limits Verbal commitment to aftercare and medication compliance  PRELIMINARY DISCHARGE PLAN: Outpatient therapy Placement in alternative living arrangements  PATIENT/FAMILY INVOLVEMENT: This treatment plan has been presented to and reviewed with the patient, Benjamin Braun, and/or family member,   The patient and family have been given the opportunity to ask questions and make suggestions.  Tad Moore, RN 07/16/2017, 5:03 PM

## 2017-07-16 NOTE — ED Provider Notes (Signed)
Patient has been accepted to the inpatient psychiatric service   Earleen Newport, MD 07/16/17 1151

## 2017-07-16 NOTE — H&P (Addendum)
Psychiatric Admission Assessment Adult  Patient Identification: Benjamin Braun MRN:  [id number] Date of Evaluation:  07/16/2017 Chief Complaint:  psychosis Principal Diagnosis: Undifferentiated schizophrenia ([place]) Diagnosis:   Patient Active Problem List   Diagnosis Date Noted  . Undifferentiated schizophrenia ([place]) [F20.3] 11/04/2016    Priority: High  . Noncompliance with treatment [Z91.19] 04/14/2015   History of Present Illness:    Identifying data. Benjamin Braun is a 52 year old male with a history of schizophrenia.  Chief complaint. The patient is mumbling about [employer].  History of present illness. Information was obtained from the patient and the chart. The patient was brought to the ER by the police after the patient became floridly psychotic, paranoid, hallucinating, calling police repeatedly. This is the usual scenario frequently resulting in patient resisting arrest and legal charges pending. This is always in the context of treatment noncompliance. The patient was hospitalized at [facility] less than a month ago and were to follow up with ACT team. It is unclear if he did. He is very disorganized in his thinking with pressured speech. He is short of hearing which makes conversation even more difficult. There is no history of substance abuse.  Past psychiatric history. Long history of mental illness with multiple hospitalizations and medication trials. History of treatment noncompliance.  Family psychiatric history. None.  Social history. He has been living independently in the same apartment for over 10 years with a fish and a dog. He has a supportive brother.   Total Time spent with patient: 1 hour  Is the patient at risk to self? No.  Has the patient been a risk to self in the past 6 months? No.  Has the patient been a risk to self within the distant past? No.  Is the patient a risk to others? No.  Has the patient been a risk to others in the past 6 months?  No.  Has the patient been a risk to others within the distant past? No.   Prior Inpatient Therapy:   Prior Outpatient Therapy:    Alcohol Screening: 1. How often do you have a drink containing alcohol?: Never 2. How many drinks containing alcohol do you have on a typical day when you are drinking?: 1 or 2 3. How often do you have six or more drinks on one occasion?: Never AUDIT-C Score: 0 4. How often during the last year have you found that you were not able to stop drinking once you had started?: Never 5. How often during the last year have you failed to do what was normally expected from you becasue of drinking?: Never 6. How often during the last year have you needed a first drink in the morning to get yourself going after a heavy drinking session?: Never 7. How often during the last year have you had a feeling of guilt of remorse after drinking?: Never 8. How often during the last year have you been unable to remember what happened the night before because you had been drinking?: Never 9. Have you or someone else been injured as a result of your drinking?: No 10. Has a relative or friend or a doctor or another health worker been concerned about your drinking or suggested you cut down?: No Alcohol Use Disorder Identification Test Final Score (AUDIT): 0 Intervention/Follow-up: Patient Refused Substance Abuse History in the last 12 months:  No. Consequences of Substance Abuse: NA Previous Psychotropic Medications: Yes  Psychological Evaluations: No  Past Medical History:  Past Medical History:  Diagnosis Date  . Anxiety   . Generalized anxiety disorder   . Schizophrenia [facility])     Past Surgical History:  Procedure Laterality Date  . TONSILLECTOMY     Family History:  Family History  Family history unknown: Yes   Tobacco Screening: Have you used any form of tobacco in the last 30 days? (Cigarettes, Smokeless Tobacco, Cigars, and/or Pipes): No Social History:  Social History    Substance and Sexual Activity  Alcohol Use No  . Alcohol/week: 0.0 oz     Social History   Substance and Sexual Activity  Drug Use No    Additional Social History:                           Allergies:  No Known Allergies Lab Results:  Results for orders placed or performed during the hospital encounter of 07/16/17 (from the past 48 hour(s))  Urine Drug Screen, Qualitative     Status: Abnormal   Collection Time: 07/16/17  6:46 PM  Result Value Ref Range   Tricyclic, Ur Screen POSITIVE (A) NONE DETECTED   Amphetamines, Ur Screen NONE DETECTED NONE DETECTED   MDMA (Ecstasy)Ur Screen NONE DETECTED NONE DETECTED   Cocaine Metabolite,Ur [place] NONE DETECTED NONE DETECTED   Opiate, Ur Screen NONE DETECTED NONE DETECTED   Phencyclidine (PCP) Ur S NONE DETECTED NONE DETECTED   Cannabinoid 50 Ng, Ur [place] NONE DETECTED NONE DETECTED   Barbiturates, Ur Screen NONE DETECTED NONE DETECTED   Benzodiazepine, Ur Scrn NONE DETECTED NONE DETECTED   Methadone Scn, Ur NONE DETECTED NONE DETECTED    Comment: (NOTE) Tricyclics + metabolites, urine    Cutoff 1000 ng/mL Amphetamines + metabolites, urine  Cutoff 1000 ng/mL MDMA (Ecstasy), urine              Cutoff 500 ng/mL Cocaine Metabolite, urine          Cutoff 300 ng/mL Opiate + metabolites, urine        Cutoff 300 ng/mL Phencyclidine (PCP), urine         Cutoff 25 ng/mL Cannabinoid, urine                 Cutoff 50 ng/mL Barbiturates + metabolites, urine  Cutoff 200 ng/mL Benzodiazepine, urine              Cutoff 200 ng/mL Methadone, urine                   Cutoff 300 ng/mL The urine drug screen provides only a preliminary, unconfirmed analytical test result and should not be used for non-medical purposes. Clinical consideration and professional judgment should be applied to any positive drug screen result due to possible interfering substances. A more specific alternate chemical method must be used in order to obtain a confirmed  analytical result. Gas chromatography / mass spectrometry (GC/MS) is the preferred confirmat ory method. Performed at [facility], [address] [place], [place] [address]     Blood Alcohol level:  Lab Results  Component Value Date   [facility] <10 07/15/2017   ETH <10 38/25/0539    Metabolic Disorder Labs:  Lab Results  Component Value Date   HGBA1C 5.4 06/19/2017   MPG 108.28 06/19/2017   MPG 111 11/05/2016   No results found for: PROLACTIN Lab Results  Component Value Date   CHOL 240 (H) 06/19/2017   TRIG 290 (H) 06/19/2017   HDL 36 (L) 06/19/2017   CHOLHDL  6.7 06/19/2017   VLDL 58 (H) 06/19/2017   LDLCALC 146 (H) 06/19/2017   LDLCALC 115 (H) 11/05/2016    Current Medications: Current Facility-Administered Medications  Medication Dose Route Frequency Provider Last Rate Last Dose  . acetaminophen (TYLENOL) tablet 650 mg  650 mg Oral Q6H PRN Clapacs, [name], MD      . alum & mag hydroxide-simeth (MAALOX/MYLANTA) 200-200-20 MG/5ML suspension 30 mL  30 mL Oral Q4H PRN Clapacs, [name], MD      . divalproex (DEPAKOTE) DR tablet 500 mg  500 mg Oral Q12H Clapacs, [name], MD   500 mg at 07/16/17 2235  . haloperidol (HALDOL) tablet 10 mg  10 mg Oral QHS Mollie Rossano B, MD   10 mg at 07/16/17 2234  . haloperidol decanoate (HALDOL DECANOATE) 100 MG/ML injection 100 mg  100 mg Intramuscular Q28 days Keymiah Lyles B, MD      . magnesium hydroxide (MILK OF MAGNESIA) suspension 30 mL  30 mL Oral Daily PRN Clapacs, [name], MD      . OLANZapine (ZYPREXA) tablet 15 mg  15 mg Oral QHS Clapacs, [name], MD   15 mg at 07/16/17 2234   PTA Medications: Medications Prior to Admission  Medication Sig Dispense Refill Last Dose  . divalproex (DEPAKOTE) 500 MG DR tablet Take 1 tablet (500 mg total) by mouth every 12 (twelve) hours. 60 tablet 1 07/16/2017 at 1051  . haloperidol decanoate (HALDOL DECANOATE) 100 MG/ML injection Inject 1 mL (100 mg total) into the muscle every 30  (thirty) days. Next injection on 12/09/2016. 1 mL 1 07/16/2017 at Unknown per pt.  Marland Kitchen OLANZapine (ZYPREXA) 15 MG tablet Take 1 tablet (15 mg total) by mouth at bedtime. 30 tablet 1 07/16/2017 at Unknown per pt.    Musculoskeletal: Strength & Muscle Tone: within normal limits Gait & Station: normal Patient leans: N/A  Psychiatric Specialty Exam: Physical Exam  Nursing note and vitals reviewed. Constitutional: He is oriented to person, place, and time. He appears well-developed and well-nourished.  HENT:  Head: Normocephalic and atraumatic.  Eyes: Conjunctivae and EOM are normal. Pupils are equal, round, and reactive to light.  Neck: Normal range of motion. Neck supple.  Cardiovascular: Normal rate and regular rhythm.  Respiratory: Effort normal and breath sounds normal.  GI: Soft. Bowel sounds are normal.  Musculoskeletal: Normal range of motion.  Neurological: He is alert and oriented to person, place, and time.  Skin: Skin is warm and dry.  Psychiatric: His mood appears anxious. His speech is rapid and/or pressured. He is agitated, hyperactive and actively hallucinating. Thought content is paranoid and delusional. Cognition and memory are impaired. He expresses impulsivity. He is noncommunicative.    Review of Systems  Neurological: Negative.   Psychiatric/Behavioral: Positive for hallucinations. The patient has insomnia.   All other systems reviewed and are negative.   Blood pressure (!) 147/91, pulse (!) 120, temperature (!) 97.5 F (36.4 C), temperature source Oral, resp. rate 20, height 5\' 6"  (1.676 m), weight 93.9 kg (207 lb), SpO2 96 %.Body mass index is 33.41 kg/m.  See SRA                                                  Sleep:       Treatment Plan Summary: Daily contact with patient to assess and evaluate symptoms  and progress in treatment and Medication management   Mr. Araki is a 52 year old male with a history of schizophrenia admitted floridly  psychotic in the context of medication noncompliance.   #Psychosis -restart Zyprexa 15 mg nightly -continue Haldol decanoate100 mg monthly injections -restart Depakote500 mg BID -restart Haldol 10 mg nightly -consider Clozapine  #Metabilic syndrome monitoring -Lipid panel, TSH and HgbA1C were recently done -EKG, QTc 435  #Disposition -TBE -discharge to his apartment -follow up with [facility] team on outpatient commitment  Observation Level/Precautions:  15 minute checks  Laboratory:  CBC Chemistry Profile UDS UA  Psychotherapy:    Medications:    Consultations:    Discharge Concerns:    Estimated LOS:  Other:     Physician Treatment Plan for Primary Diagnosis: Undifferentiated schizophrenia ([place]) Long Term Goal(s): Improvement in symptoms so as ready for discharge  Short Term Goals: Ability to identify changes in lifestyle to reduce recurrence of condition will improve, Ability to verbalize feelings will improve, Ability to disclose and discuss suicidal ideas, Ability to demonstrate self-control will improve, Ability to identify and develop effective coping behaviors will improve, Ability to maintain clinical measurements within normal limits will improve, Compliance with prescribed medications will improve and Ability to identify triggers associated with substance abuse/mental health issues will improve  Physician Treatment Plan for Secondary Diagnosis: Principal Problem:   Undifferentiated schizophrenia ([place]) Active Problems:   Noncompliance with treatment  Long Term Goal(s): NA  Short Term Goals: NA  I certify that inpatient services furnished can reasonably be expected to improve the patient's condition.    Orson Slick, MD 2/6/201911:06 PM

## 2017-07-16 NOTE — ED Notes (Signed)
Report to oncoming nurses.

## 2017-07-16 NOTE — BHH Suicide Risk Assessment (Addendum)
[facility] Admission Suicide Risk Assessment   Nursing information obtained from:  Patient Demographic factors:  Male, Caucasian Current Mental Status:  NA Loss Factors:  NA Historical Factors:  NA Risk Reduction Factors:  Positive social support  Total Time spent with patient: 1 hour Principal Problem: Undifferentiated schizophrenia (Benjamin Braun) Diagnosis:   Patient Active Problem List   Diagnosis Date Noted  . 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 Noncompliance with treatment [Z91.19] 04/14/2015   Subjective Data: psychotic break  Continued Clinical Symptoms:  Alcohol Use Disorder Identification Test Final Score (AUDIT): 0 The "Alcohol Use Disorders Identification Test", Guidelines for Use in Primary Care, Second Edition.  World Pharmacologist [facility]). Score between 0-7:  no or low risk or alcohol related problems. Score between 8-15:  moderate risk of alcohol related problems. Score between 16-19:  high risk of alcohol related problems. Score 20 or above:  warrants further diagnostic evaluation for alcohol dependence and treatment.   CLINICAL FACTORS:   Schizophrenia:   Command hallucinatons Paranoid or undifferentiated type Currently Psychotic Unstable or Poor Therapeutic Relationship   Musculoskeletal: Strength & Muscle Tone: within normal limits Gait & Station: normal Patient leans: N/A  Psychiatric Specialty Exam: Physical Exam  Nursing note and vitals reviewed. Psychiatric: His mood appears anxious. His speech is delayed. He is slowed and actively hallucinating. Thought content is paranoid and delusional. Cognition and memory are impaired. He expresses impulsivity. He is noncommunicative.    Review of Systems  Neurological: Negative.   Psychiatric/Behavioral: Positive for hallucinations. The patient has insomnia.   All other systems reviewed and are negative.   Blood pressure (!) 147/91, pulse (!) 120, temperature (!) 97.5 F  (36.4 C), temperature source Oral, resp. rate 20, height 5\' 6"  (1.676 m), weight 93.9 kg (207 lb), SpO2 96 %.Body mass index is 33.41 kg/m.  General Appearance: Disheveled  Eye Contact:  Fair  Speech:  Garbled and Pressured  Volume:  Increased  Mood:  Angry  Affect:  Congruent  Thought Process:  Disorganized and Descriptions of Associations: Tangential  Orientation:  Full (Time, Place, and Person)  Thought Content:  Delusions, Hallucinations: Auditory and Paranoid Ideation  Suicidal Thoughts:  No  Homicidal Thoughts:  No  Memory:  Immediate;   Fair Recent;   Fair Remote;   Fair  Judgement:  Poor  Insight:  Lacking  Psychomotor Activity:  Increased  Concentration:  Concentration: Poor and Attention Span: Poor  Recall:  Poor  Fund of Knowledge:  Poor  Language:  Poor  Akathisia:  No  Handed:  Right  AIMS (if indicated):     Assets:  Communication Skills Desire for Improvement Financial Resources/Insurance Housing Physical Health Resilience Social Support  ADL's:  Intact  Cognition:  WNL  Sleep:         COGNITIVE FEATURES THAT CONTRIBUTE TO RISK:  None    SUICIDE RISK:   Moderate:  Frequent suicidal ideation with limited intensity, and duration, some specificity in terms of plans, no associated intent, good self-control, limited dysphoria/symptomatology, some risk factors present, and identifiable protective factors, including available and accessible social support.  PLAN OF CARE: hospital admission, medication management, discharge planning.  Benjamin Braun is a 52 year old male with a history of schizophrenia admitted floridly psychotic in the context of medication noncompliance.   #Psychosis -restart Zyprexa 15 mg nightly -continue Haldol decanoate100 mg monthly injections -restart Depakote500 mg BID -restart Haldol 10 mg nightly -consider Clozapine  #Metabilic syndrome monitoring -Lipid panel, TSH and HgbA1C  were recently done -EKG, QTc  435  #Disposition -TBE -discharge to his apartment -follow up with [place] team on outpatient commitment        I certify that inpatient services furnished can reasonably be expected to improve the patient's condition.   Orson Slick, MD 07/16/2017, 6:09 PM

## 2017-07-17 NOTE — BHH Group Notes (Signed)
07/17/2017  Time: 1:00PM  Type of Therapy/Topic:  Group Therapy:  Balance in Life  Participation Level:  Minimal  Description of Group:   This group will address the concept of balance and how it feels and looks when one is unbalanced. Patients will be encouraged to process areas in their lives that are out of balance and identify reasons for remaining unbalanced. Facilitators will guide patients in utilizing problem-solving interventions to address and correct the stressor making their life unbalanced. Understanding and applying boundaries will be explored and addressed for obtaining and maintaining a balanced life. Patients will be encouraged to explore ways to assertively make their unbalanced needs known to significant others in their lives, using other group members and facilitator for support and feedback.  Therapeutic Goals: 1. Patient will identify two or more emotions or situations they have that consume much of in their lives. 2. Patient will identify signs/triggers that life has become out of balance:  3. Patient will identify two ways to set boundaries in order to achieve balance in their lives:  4. Patient will demonstrate ability to communicate their needs through discussion and/or role plays  Summary of Patient Progress: Pt attended group but did not actively participate in group discussions. Pt reported he is, "feeling trapped in here. I want to get out of here and go feed my fish and my dog." Pt reported he feels sleep consumes too much of his time. Pt was not able to provide further information on this topic.      Therapeutic Modalities:   Cognitive Behavioral Therapy Solution-Focused Therapy Assertiveness Training   Alden Hipp, MSW, LCSW 07/17/2017 1:59 PM

## 2017-07-17 NOTE — Progress Notes (Signed)
Patient ID: Benjamin Braun, male   DOB: 1966-03-22, 52 y.o.   MRN: [id number] CSW went by pt's room on the BMU in an attempt to met with him to complete a PSA and obtain consents.  Pt was sleeping in his bed and could not be awaken.  CSW received reports from pt's nurse and psychiatrist that he has been very disorganized today and may not be able to engage in an interview with CSW.  CSW will attempt to meet with pt on today, July 18, 2017.

## 2017-07-17 NOTE — Progress Notes (Signed)
Recreation Therapy Notes  INPATIENT RECREATION THERAPY ASSESSMENT  Patient Details Name: Benjamin Braun MRN: [id number] DOB: July 25, 1965 Today's Date: 07/17/2017  Patient unable to complete assessment at this time.       Information Obtained From:    Able to Participate in Assessment/Interview:    Patient Presentation:    Reason for Admission (Per Patient):    Patient Stressors:    Coping Skills:      Leisure Interests (2+):     Frequency of Recreation/Participation:    Awareness of Community Resources:     [employer]:     Current Use:    If no, Barriers?:    Expressed Interest in [employer] Information:    Patient Main Form of Transportation:    Patient Strengths:     Patient Identified Areas of Improvement:     Current Recreation Participation:     Patient Goal for Hospitalization:     [place] of Residence:     [place] of Residence:     Current SI (including self-harm):     Current HI:     Current AVH:    Staff Intervention Plan:    Consent to Intern Participation:    Benjamin Braun 07/17/2017, 2:45 PM

## 2017-07-17 NOTE — Progress Notes (Signed)
[facility] MD Progress Note  07/17/2017 10:29 AM LOGHAN KURTZMAN  MRN:  [id number]  Subjective:    Mr. Godsey is fast asleep this morning and hard to atrouse. This is in part due to deafness. He refused to use his hearing aid. He refused Zyprexa last night complaining that it makes him gain weight. He is still very disorganized with racing thoughts and irritability.  Left a message for [name] at [place] team. I am uncertain if the patient is in their care.  Treatment plan. We will continue Zyprexa, Depakote and Haldol. Haldol decanoate injection today. We will offer Clozapine.  Social/disposition. It is unclear if the patient is safe in the community. He may need placement.  Principal Problem: Undifferentiated schizophrenia ([place]) Diagnosis:   Patient Active Problem List   Diagnosis Date Noted  . Undifferentiated schizophrenia ([place]) [F20.3] 11/04/2016    Priority: High  . Noncompliance with treatment [Z91.19] 04/14/2015   Total Time spent with patient: 20 minutes  Past Psychiatric History: schizophrenia.  Past Medical History:  Past Medical History:  Diagnosis Date  . Anxiety   . Generalized anxiety disorder   . Schizophrenia [facility])     Past Surgical History:  Procedure Laterality Date  . TONSILLECTOMY     Family History:  Family History  Family history unknown: Yes   Family Psychiatric  History: none reported Social History:  Social History   Substance and Sexual Activity  Alcohol Use No  . Alcohol/week: 0.0 oz     Social History   Substance and Sexual Activity  Drug Use No    Social History   Socioeconomic History  . Marital status: Single    Spouse name: None  . Number of children: None  . Years of education: None  . Highest education level: None  Social Needs  . Financial resource strain: None  . Food insecurity - worry: None  . Food insecurity - inability: None  . Transportation needs - medical: None  . Transportation needs - non-medical: None   Occupational History  . None  Tobacco Use  . Smoking status: Former Smoker    Types: Cigarettes    Last attempt to quit: 06/11/1999    Years since quitting: 18.1  . Smokeless tobacco: Never Used  Substance and Sexual Activity  . Alcohol use: No    Alcohol/week: 0.0 oz  . Drug use: No  . Sexual activity: No  Other Topics Concern  . None  Social History Narrative  . None   Additional Social History:                         Sleep: Fair  Appetite:  Fair  Current Medications: Current Facility-Administered Medications  Medication Dose Route Frequency Provider Last Rate Last Dose  . acetaminophen (TYLENOL) tablet 650 mg  650 mg Oral Q6H PRN Clapacs, [name], MD      . alum & mag hydroxide-simeth (MAALOX/MYLANTA) 200-200-20 MG/5ML suspension 30 mL  30 mL Oral Q4H PRN Clapacs, [name], MD      . divalproex (DEPAKOTE) DR tablet 500 mg  500 mg Oral Q12H Clapacs, [name], MD   500 mg at 07/17/17 0851  . haloperidol (HALDOL) tablet 10 mg  10 mg Oral QHS Reeshemah Nazaryan B, MD   10 mg at 07/16/17 2234  . haloperidol decanoate (HALDOL DECANOATE) 100 MG/ML injection 100 mg  100 mg Intramuscular Q28 days Yahya Boldman B, MD   100 mg at 07/17/17  0851  . magnesium hydroxide (MILK OF MAGNESIA) suspension 30 mL  30 mL Oral Daily PRN Clapacs, [name], MD      . OLANZapine (ZYPREXA) tablet 15 mg  15 mg Oral QHS Clapacs, [name], MD   15 mg at 07/16/17 2234    Lab Results:  Results for orders placed or performed during the hospital encounter of 07/16/17 (from the past 48 hour(s))  Urine Drug Screen, Qualitative     Status: Abnormal   Collection Time: 07/16/17  6:46 PM  Result Value Ref Range   Tricyclic, Ur Screen POSITIVE (A) NONE DETECTED   Amphetamines, Ur Screen NONE DETECTED NONE DETECTED   MDMA (Ecstasy)Ur Screen NONE DETECTED NONE DETECTED   Cocaine Metabolite,Ur [place] NONE DETECTED NONE DETECTED   Opiate, Ur Screen NONE DETECTED NONE DETECTED   Phencyclidine (PCP) Ur S NONE  DETECTED NONE DETECTED   Cannabinoid 50 Ng, Ur [place] NONE DETECTED NONE DETECTED   Barbiturates, Ur Screen NONE DETECTED NONE DETECTED   Benzodiazepine, Ur Scrn NONE DETECTED NONE DETECTED   Methadone Scn, Ur NONE DETECTED NONE DETECTED    Comment: (NOTE) Tricyclics + metabolites, urine    Cutoff 1000 ng/mL Amphetamines + metabolites, urine  Cutoff 1000 ng/mL MDMA (Ecstasy), urine              Cutoff 500 ng/mL Cocaine Metabolite, urine          Cutoff 300 ng/mL Opiate + metabolites, urine        Cutoff 300 ng/mL Phencyclidine (PCP), urine         Cutoff 25 ng/mL Cannabinoid, urine                 Cutoff 50 ng/mL Barbiturates + metabolites, urine  Cutoff 200 ng/mL Benzodiazepine, urine              Cutoff 200 ng/mL Methadone, urine                   Cutoff 300 ng/mL The urine drug screen provides only a preliminary, unconfirmed analytical test result and should not be used for non-medical purposes. Clinical consideration and professional judgment should be applied to any positive drug screen result due to possible interfering substances. A more specific alternate chemical method must be used in order to obtain a confirmed analytical result. Gas chromatography / mass spectrometry (GC/MS) is the preferred confirmat ory method. Performed at [facility], [address] [place], [place] [address]     Blood Alcohol level:  Lab Results  Component Value Date   [facility] <10 07/15/2017   ETH <10 35/00/9381    Metabolic Disorder Labs: Lab Results  Component Value Date   HGBA1C 5.4 06/19/2017   MPG 108.28 06/19/2017   MPG 111 11/05/2016   No results found for: PROLACTIN Lab Results  Component Value Date   CHOL 240 (H) 06/19/2017   TRIG 290 (H) 06/19/2017   HDL 36 (L) 06/19/2017   CHOLHDL 6.7 06/19/2017   VLDL 58 (H) 06/19/2017   LDLCALC 146 (H) 06/19/2017   LDLCALC 115 (H) 11/05/2016    Physical Findings: AIMS: Facial and Oral Movements Muscles of Facial Expression:  None, normal Lips and Perioral Area: None, normal Jaw: None, normal Tongue: None, normal,Extremity Movements Upper (arms, wrists, hands, fingers): None, normal Lower (legs, knees, ankles, toes): None, normal, Trunk Movements Neck, shoulders, hips: None, normal, Overall Severity Severity of abnormal movements (highest score from questions above): None, normal Incapacitation due to abnormal movements: None, normal  Patient's awareness of abnormal movements (rate only patient's report): No Awareness, Dental Status Current problems with teeth and/or dentures?: Yes(Poor dentition) Does patient usually wear dentures?: No  CIWA:    COWS:     Musculoskeletal: Strength & Muscle Tone: within normal limits Gait & Station: normal Patient leans: N/A  Psychiatric Specialty Exam: Physical Exam  Nursing note and vitals reviewed. Psychiatric: His affect is blunt and inappropriate. His speech is rapid and/or pressured. He is actively hallucinating. Thought content is paranoid and delusional. Cognition and memory are impaired. He expresses impulsivity.    Review of Systems  Neurological: Negative.   Psychiatric/Behavioral: Negative.   All other systems reviewed and are negative.   Blood pressure 112/85, pulse (!) 123, temperature 98.3 F (36.8 C), temperature source Oral, resp. rate 20, height 5\' 6"  (1.676 m), weight 93.9 kg (207 lb), SpO2 96 %.Body mass index is 33.41 kg/m.  General Appearance: Disheveled  Eye Contact:  Minimal  Speech:  Pressured  Volume:  Increased  Mood:  Dysphoric  Affect:  Congruent  Thought Process:  Disorganized and Descriptions of Associations: Loose  Orientation:  Full (Time, Place, and Person)  Thought Content:  Delusions, Hallucinations: Auditory and Paranoid Ideation  Suicidal Thoughts:  No  Homicidal Thoughts:  No  Memory:  Immediate;   Fair Recent;   Fair Remote;   Fair  Judgement:  Poor  Insight:  Lacking  Psychomotor Activity:  Increased  Concentration:   Concentration: Poor and Attention Span: Poor  Recall:  Poor  Fund of Knowledge:  Poor  Language:  Poor  Akathisia:  No  Handed:  Right  AIMS (if indicated):     Assets:  Communication Skills Desire for Improvement Financial Resources/Insurance Housing Physical Health Resilience Social Support  ADL's:  Intact  Cognition:  WNL  Sleep:  Number of Hours: 7.3     Treatment Plan Summary: Daily contact with patient to assess and evaluate symptoms and progress in treatment and Medication management   Mr. Mccleery is a 52 year old male with a history of schizophrenia admitted floridly psychotic in the context of medication noncompliance.  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

## 2017-07-17 NOTE — Plan of Care (Signed)
  Progressing Coping: Ability to cope will improve 07/17/2017 2258 - Progressing by Derek Mound, RN Ability to verbalize feelings will improve 07/17/2017 2258 - Progressing by Derek Mound, RN Coping: Level of anxiety will decrease 07/17/2017 2258 - Progressing by Derek Mound, RN Pain Managment: General experience of comfort will improve 07/17/2017 2258 - Progressing by Derek Mound, RN

## 2017-07-17 NOTE — Progress Notes (Signed)
Recreation Therapy Notes   Date: 02.07.2019  Time: 9:30 am  Location: Craft Room  Behavioral response: N/A  Intervention Topic: Problem Solving  Discussion/Intervention: Patient did not attend group.   Clinical Observations/Feedback: Patient did not attend group.  Jaide Hillenburg LRT/CTRS          Tharon Bomar 07/17/2017 11:07 AM

## 2017-07-17 NOTE — Progress Notes (Signed)
Patient ID: Benjamin Braun, male   DOB: Jan 11, 1966, 52 y.o.   MRN: [id number] CSW received a call from ACT TL, [name].  [name] informed CSW that she was never able to start working with pt as he never signed the consent paperwork.  [name] shared that pt did come to the [facility] office, but was only wanting to get his [place] shot.  [name] shared that pt was very psychotic and very fixated on the psychiatrist.  [name] shared that the psychiatrist is of [place] decent and pt began making inappropriate statements to her.  [name] shared that she had to get a few male staff to come and assist with the situation.  [name] shared that she tried to contact pt's brother for assistance, but she was unable to get the brother to assist.   [name] shared that she would still like to work with pt if he can become more stable during this hospitalization. She shared that she would like to come and visit with pt on Monday, July 21, 2017.  [name] stated that she feels that she may be able to start building rapport with pt while he is in the hospital as well as get the consents signed for services.  CSW agreed to speak with pt about ACT services and if he interested in meeting with [name].

## 2017-07-17 NOTE — Plan of Care (Signed)
Patient wearing hearing  aid but remains  to  yell out and excuse himself . Attended programing but was not  participating.  Observed  patient  talking to himself . Instructions given on medication given  Coping  leaflet given to patient . At this time patient  is not verbalizing feeling. Appetite good at meals . No anger outburst  or calling 911 on phone .  No safety concerns . Staff  instructing patient in concrete form.   Progressing Activity: Will verbalize the importance of balancing activity with adequate rest periods 07/17/2017 1746 - Progressing by Leodis Liverpool, RN Education: Will be free of psychotic symptoms 07/17/2017 1746 - Progressing by Leodis Liverpool, RN Knowledge of the prescribed therapeutic regimen will improve 07/17/2017 1746 - Progressing by Leodis Liverpool, RN Coping: Ability to cope will improve 07/17/2017 1746 - Progressing by Leodis Liverpool, RN Ability to verbalize feelings will improve 07/17/2017 1746 - Progressing by Leodis Liverpool, RN Nutritional: Ability to achieve adequate nutritional intake will improve 07/17/2017 1746 - Progressing by Leodis Liverpool, RN Safety: Ability to redirect hostility and anger into socially appropriate behaviors will improve 07/17/2017 1746 - Progressing by Leodis Liverpool, RN Ability to remain free from injury will improve 07/17/2017 1746 - Progressing by Leodis Liverpool, RN Self-Care: Ability to participate in self-care as condition permits will improve 07/17/2017 1746 - Progressing by Leodis Liverpool, RN Self-Concept: Ability to verbalize positive feelings about self will improve 07/17/2017 1746 - Progressing by Leodis Liverpool, RN Education: Ability to state activities that reduce stress will improve 07/17/2017 1746 - Progressing by Leodis Liverpool, RN Self-Concept: Level of anxiety will decrease 07/17/2017 1746 - Progressing by Leodis Liverpool, RN Ability to modify response to factors that promote anxiety will improve 07/17/2017 1746 -  Progressing by Leodis Liverpool, RN Education: Knowledge of [place] Education information/materials will improve 07/17/2017 1746 - Progressing by Leodis Liverpool, RN

## 2017-07-17 NOTE — BHH Group Notes (Signed)
LCSW Group Therapy Note 07/17/2017 9:00 AM  Type of Therapy and Topic:  Group Therapy:  Setting Goals  Participation Level:  Did Not Attend  Description of Group: In this process group, patients discussed using strengths to work toward goals and address challenges.  Patients identified two positive things about themselves and one goal they were working on.  Patients were given the opportunity to share openly and support each other's plan for self-empowerment.  The group discussed the value of gratitude and were encouraged to have a daily reflection of positive characteristics or circumstances.  Patients were encouraged to identify a plan to utilize their strengths to work on current challenges and goals.  Therapeutic Goals 1. Patient will verbalize personal strengths/positive qualities and relate how these can assist with achieving desired personal goals 2. Patients will verbalize affirmation of peers plans for personal change and goal setting 3. Patients will explore the value of gratitude and positive focus as related to successful achievement of goals 4. Patients will verbalize a plan for regular reinforcement of personal positive qualities and circumstances.  Summary of Patient Progress:       Therapeutic Modalities Cognitive Behavioral Therapy Motivational Interviewing    Devona Konig, LCSW 07/17/2017 9:59 AM

## 2017-07-17 NOTE — Progress Notes (Signed)
Patient found in day room upon my arrival. Patient is visible but not social this evening. Complains of toothache 7/10, given Tylenol with positive results. Denies SI and HI. Observed responding to internal stimuli while waiting for medications. Patient is verbally appropriate expressing gratitude for pain relief and conversing freely regarding his day. Compliant with HS medications and staff direction. Q 15 minute checks maintained. Will continue to monitor throughout the shift.

## 2017-07-18 LAB — CLOZAPINE (CLOZARIL)
CLOZAPINE LVL: 24 ng/mL — AB (ref 350–650)
NORCLOZAPINE: 56 ng/mL
Total(Cloz+Norcloz): 80 ng/mL

## 2017-07-18 MED ORDER — CLOZAPINE 25 MG PO TABS
50.0000 mg | ORAL_TABLET | Freq: Every day | ORAL | Status: DC
  Administered 2017-07-18 – 2017-07-19 (×2): 50 mg via ORAL
  Filled 2017-07-18 (×2): qty 2

## 2017-07-18 NOTE — Plan of Care (Signed)
  Progressing Education: Knowledge of the prescribed therapeutic regimen will improve 07/18/2017 2250 - Progressing by Derek Mound, RN Coping: Ability to cope will improve 07/18/2017 2250 - Progressing by Derek Mound, RN Ability to verbalize feelings will improve 07/18/2017 2250 - Progressing by Derek Mound, RN Coping: Level of anxiety will decrease 07/18/2017 2250 - Progressing by Derek Mound, RN

## 2017-07-18 NOTE — Progress Notes (Addendum)
Patient found in common area upon my arrival. Patient is visible and somewhat social throughout the evening. Patient hygiene is excellent and he is more conversational and appropriate than last hospitalization. Patient affect is animated. Denies SI, HI, AVH. Delusion regarding police and [place] (neighbors) killing his family remains fixed. Patient observed responding to internal stimuli in the hallway outside his room on one occasion. Reports eating and voiding adequately. Compliant with HS medications and staff direction. Q 15 minute checks maintained. Will continue to monitor throughout the shift. Patient slept 6.75 hours. Waking only once for Tylenol for complaints of toothache which was effective. Patient slept rest of night in no distress. Will endorse care to oncoming shift.

## 2017-07-18 NOTE — BHH Group Notes (Signed)
  07/18/2017  Time: 1:00PM  Type of Therapy and Topic:  Group Therapy:  Feelings around Relapse and Recovery  Participation Level:  Did Not Attend   Description of Group:    Patients in this group will discuss emotions they experience before and after a relapse. They will process how experiencing these feelings, or avoidance of experiencing them, relates to having a relapse. Facilitator will guide patients to explore emotions they have related to recovery. Patients will be encouraged to process which emotions are more powerful. They will be guided to discuss the emotional reaction significant others in their lives may have to their relapse or recovery. Patients will be assisted in exploring ways to respond to the emotions of others without this contributing to a relapse.  Therapeutic Goals: 1. Patient will identify two or more emotions that lead to a relapse for them 2. Patient will identify two emotions that result when they relapse 3. Patient will identify two emotions related to recovery 4. Patient will demonstrate ability to communicate their needs through discussion and/or role plays   Summary of Patient Progress: Pt was invited to attend group but chose not to attend. CSW will continue to encourage pt to attend group throughout their admission.     Therapeutic Modalities:   Cognitive Behavioral Therapy Solution-Focused Therapy Assertiveness Training Relapse Prevention Therapy  Alden Hipp, MSW, LCSW 07/18/2017 1:55 PM

## 2017-07-18 NOTE — Progress Notes (Signed)
[facility] MD Progress Note  07/18/2017 1:48 PM Benjamin Braun  MRN:  [id number]  Subjective:  Benjamin Braun met with treatment team today. His speech is easier to understand but his thoughts are scrambled. He does have his hearing aid. He denies any problems here or prior to admission and assures me that everybody is lying about him.  Treatment plan. The patient is a on Haldol and Zyprexa. Even when better, there is residual paranoia leading to problems. I will start Clozapine.  Social/disposition. It is unclear if the patient may continue living independently. [facility] team still interested in working with the patient.  Principal Problem: Undifferentiated schizophrenia ([place]) Diagnosis:   Patient Active Problem List   Diagnosis Date Noted  . Undifferentiated schizophrenia ([place]) [F20.3] 11/04/2016    Priority: High  . Noncompliance with treatment [Z91.19] 04/14/2015   Total Time spent with patient: 30 minutes  Past Psychiatric History: schizophrenia.  Past Medical History:  Past Medical History:  Diagnosis Date  . Anxiety   . Generalized anxiety disorder   . Schizophrenia [facility])     Past Surgical History:  Procedure Laterality Date  . TONSILLECTOMY     Family History:  Family History  Family history unknown: Yes   Family Psychiatric  History: none reported. Social History:  Social History   Substance and Sexual Activity  Alcohol Use No  . Alcohol/week: 0.0 oz     Social History   Substance and Sexual Activity  Drug Use No    Social History   Socioeconomic History  . Marital status: Single    Spouse name: None  . Number of children: None  . Years of education: None  . Highest education level: None  Social Needs  . Financial resource strain: None  . Food insecurity - worry: None  . Food insecurity - inability: None  . Transportation needs - medical: None  . Transportation needs - non-medical: None  Occupational History  . None  Tobacco Use  . Smoking  status: Former Smoker    Types: Cigarettes    Last attempt to quit: 06/11/1999    Years since quitting: 18.1  . Smokeless tobacco: Never Used  Substance and Sexual Activity  . Alcohol use: No    Alcohol/week: 0.0 oz  . Drug use: No  . Sexual activity: No  Other Topics Concern  . None  Social History Narrative  . None   Additional Social History:                         Sleep: Fair  Appetite:  Fair  Current Medications: Current Facility-Administered Medications  Medication Dose Route Frequency Provider Last Rate Last Dose  . acetaminophen (TYLENOL) tablet 650 mg  650 mg Oral Q6H PRN Clapacs, [name], MD   650 mg at 07/17/17 1955  . alum & mag hydroxide-simeth (MAALOX/MYLANTA) 200-200-20 MG/5ML suspension 30 mL  30 mL Oral Q4H PRN Clapacs, [name], MD      . cloZAPine (CLOZARIL) tablet 50 mg  50 mg Oral QHS Jenniferann Stuckert B, MD      . divalproex (DEPAKOTE) DR tablet 500 mg  500 mg Oral Q12H Clapacs, [name], MD   500 mg at 07/17/17 1956  . haloperidol (HALDOL) tablet 10 mg  10 mg Oral QHS Carlethia Mesquita B, MD   10 mg at 07/17/17 2114  . haloperidol decanoate (HALDOL DECANOATE) 100 MG/ML injection 100 mg  100 mg Intramuscular Q28 days Ubah Radke  B, MD   100 mg at 07/17/17 0851  . magnesium hydroxide (MILK OF MAGNESIA) suspension 30 mL  30 mL Oral Daily PRN Clapacs, [name], MD      . OLANZapine (ZYPREXA) tablet 15 mg  15 mg Oral QHS Clapacs, [name], MD   15 mg at 07/17/17 2114    Lab Results:  Results for orders placed or performed during the hospital encounter of 07/16/17 (from the past 48 hour(s))  Urine Drug Screen, Qualitative     Status: Abnormal   Collection Time: 07/16/17  6:46 PM  Result Value Ref Range   Tricyclic, Ur Screen POSITIVE (A) NONE DETECTED   Amphetamines, Ur Screen NONE DETECTED NONE DETECTED   MDMA (Ecstasy)Ur Screen NONE DETECTED NONE DETECTED   Cocaine Metabolite,Ur [place] NONE DETECTED NONE DETECTED   Opiate, Ur Screen NONE DETECTED  NONE DETECTED   Phencyclidine (PCP) Ur S NONE DETECTED NONE DETECTED   Cannabinoid 50 Ng, Ur [place] NONE DETECTED NONE DETECTED   Barbiturates, Ur Screen NONE DETECTED NONE DETECTED   Benzodiazepine, Ur Scrn NONE DETECTED NONE DETECTED   Methadone Scn, Ur NONE DETECTED NONE DETECTED    Comment: (NOTE) Tricyclics + metabolites, urine    Cutoff 1000 ng/mL Amphetamines + metabolites, urine  Cutoff 1000 ng/mL MDMA (Ecstasy), urine              Cutoff 500 ng/mL Cocaine Metabolite, urine          Cutoff 300 ng/mL Opiate + metabolites, urine        Cutoff 300 ng/mL Phencyclidine (PCP), urine         Cutoff 25 ng/mL Cannabinoid, urine                 Cutoff 50 ng/mL Barbiturates + metabolites, urine  Cutoff 200 ng/mL Benzodiazepine, urine              Cutoff 200 ng/mL Methadone, urine                   Cutoff 300 ng/mL The urine drug screen provides only a preliminary, unconfirmed analytical test result and should not be used for non-medical purposes. Clinical consideration and professional judgment should be applied to any positive drug screen result due to possible interfering substances. A more specific alternate chemical method must be used in order to obtain a confirmed analytical result. Gas chromatography / mass spectrometry (GC/MS) is the preferred confirmat ory method. Performed at [facility], [address] [place], [place] [address]     Blood Alcohol level:  Lab Results  Component Value Date   [facility] <10 07/15/2017   ETH <10 60/45/4098    Metabolic Disorder Labs: Lab Results  Component Value Date   HGBA1C 5.4 06/19/2017   MPG 108.28 06/19/2017   MPG 111 11/05/2016   No results found for: PROLACTIN Lab Results  Component Value Date   CHOL 240 (H) 06/19/2017   TRIG 290 (H) 06/19/2017   HDL 36 (L) 06/19/2017   CHOLHDL 6.7 06/19/2017   VLDL 58 (H) 06/19/2017   LDLCALC 146 (H) 06/19/2017   LDLCALC 115 (H) 11/05/2016    Physical Findings: AIMS: Facial and  Oral Movements Muscles of Facial Expression: None, normal Lips and Perioral Area: None, normal Jaw: None, normal Tongue: None, normal,Extremity Movements Upper (arms, wrists, hands, fingers): None, normal Lower (legs, knees, ankles, toes): None, normal, Trunk Movements Neck, shoulders, hips: None, normal, Overall Severity Severity of abnormal movements (highest score from questions above): None,  normal Incapacitation due to abnormal movements: None, normal Patient's awareness of abnormal movements (rate only patient's report): No Awareness, Dental Status Current problems with teeth and/or dentures?: Yes(Poor dentition) Does patient usually wear dentures?: No  CIWA:    COWS:     Musculoskeletal: Strength & Muscle Tone: within normal limits Gait & Station: normal Patient leans: N/A  Psychiatric Specialty Exam: Physical Exam  Nursing note and vitals reviewed. Psychiatric: His affect is inappropriate. His speech is rapid and/or pressured and tangential. He is actively hallucinating. Thought content is paranoid and delusional. Cognition and memory are impaired. He expresses impulsivity.    Review of Systems  Neurological: Negative.   Psychiatric/Behavioral: Positive for hallucinations.  All other systems reviewed and are negative.   Blood pressure 108/67, pulse 89, temperature 98.3 F (36.8 C), temperature source Oral, resp. rate 20, height _0  (1.676 m), weight 93.9 kg (207 lb), SpO2 96 %.Body mass index is 33.41 kg/m.  General Appearance: Fairly Groomed  Eye Contact:  Good  Speech:  Pressured  Volume:  Increased  Mood:  Dysphoric  Affect:  Congruent  Thought Process:  Disorganized and Descriptions of Associations: Tangential  Orientation:  Full (Time, Place, and Person)  Thought Content:  Delusions, Hallucinations: Auditory and Paranoid Ideation  Suicidal Thoughts:  No  Homicidal Thoughts:  No  Memory:  Immediate;   Fair Recent;   Fair Remote;   Fair  Judgement:  Poor   Insight:  Lacking  Psychomotor Activity:  Increased  Concentration:  Concentration: Poor and Attention Span: Poor  Recall:  Poor  Fund of Knowledge:  Poor  Language:  Poor  Akathisia:  No  Handed:  Right  AIMS (if indicated):     Assets:  Communication Skills Desire for Improvement Financial Resources/Insurance Housing Physical Health Resilience Social Support  ADL's:  Intact  Cognition:  WNL  Sleep:  Number of Hours: 7     Treatment Plan Summary: Daily contact with patient to assess and evaluate symptoms and progress in treatment and Medication management   Benjamin Braun is a 52 year old male with a history of schizophrenia admitted floridly psychotic in the context of medication noncompliance.  #Psychosis -restart Zyprexa 15 mg nightly -continueHaldol decanoate100 mgmonthly injections -restart Depakote500 mg BID -restart Haldol 10 mg nightly -start Clozapine 50 mg nightly  #Metabilic syndrome monitoring -Lipid panel, TSH and HgbA1C were recently done -EKG, QTc 435  #Disposition -TBE -discharge to his apartment -followup with [facility] teamon outpatient commitment    Orson Slick, MD 07/18/2017, 1:48 PM

## 2017-07-18 NOTE — Tx Team (Signed)
Interdisciplinary Treatment and Diagnostic Plan Update  07/18/2017 Time of Session: 10:40 AM Benjamin Braun MRN: [id number]  Principal Diagnosis: Undifferentiated schizophrenia [facility])  Secondary Diagnoses: Principal Problem:   Undifferentiated schizophrenia ([place]) Active Problems:   Noncompliance with treatment   Current Medications:  Current Facility-Administered Medications  Medication Dose Route Frequency Provider Last Rate Last Dose  . acetaminophen (TYLENOL) tablet 650 mg  650 mg Oral Q6H PRN Clapacs, [name], MD   650 mg at 07/17/17 1955  . alum & mag hydroxide-simeth (MAALOX/MYLANTA) 200-200-20 MG/5ML suspension 30 mL  30 mL Oral Q4H PRN Clapacs, [name], MD      . cloZAPine (CLOZARIL) tablet 50 mg  50 mg Oral QHS Pucilowska, Jolanta B, MD      . divalproex (DEPAKOTE) DR tablet 500 mg  500 mg Oral Q12H Clapacs, [name], MD   500 mg at 07/17/17 1956  . haloperidol (HALDOL) tablet 10 mg  10 mg Oral QHS Pucilowska, Jolanta B, MD   10 mg at 07/17/17 2114  . haloperidol decanoate (HALDOL DECANOATE) 100 MG/ML injection 100 mg  100 mg Intramuscular Q28 days Pucilowska, Jolanta B, MD   100 mg at 07/17/17 0851  . magnesium hydroxide (MILK OF MAGNESIA) suspension 30 mL  30 mL Oral Daily PRN Clapacs, [name], MD      . OLANZapine (ZYPREXA) tablet 15 mg  15 mg Oral QHS Clapacs, [name], MD   15 mg at 07/17/17 2114   PTA Medications: Medications Prior to Admission  Medication Sig Dispense Refill Last Dose  . divalproex (DEPAKOTE) 500 MG DR tablet Take 1 tablet (500 mg total) by mouth every 12 (twelve) hours. 60 tablet 1 07/16/2017 at 1051  . haloperidol decanoate (HALDOL DECANOATE) 100 MG/ML injection Inject 1 mL (100 mg total) into the muscle every 30 (thirty) days. Next injection on 12/09/2016. 1 mL 1 07/16/2017 at Unknown per pt.  Marland Kitchen OLANZapine (ZYPREXA) 15 MG tablet Take 1 tablet (15 mg total) by mouth at bedtime. 30 tablet 1 07/16/2017 at Unknown per pt.    Patient Stressors: Medication change or  noncompliance  Patient Strengths: Ability for insight Average or above average intelligence General fund of knowledge Motivation for treatment/growth  Treatment Modalities: Medication Management, Group therapy, Case management,  1 to 1 session with clinician, Psychoeducation, Recreational therapy.   Physician Treatment Plan for Primary Diagnosis: Undifferentiated schizophrenia ([place]) Long Term Goal(s): Improvement in symptoms so as ready for discharge NA   Short Term Goals: Ability to identify changes in lifestyle to reduce recurrence of condition will improve Ability to verbalize feelings will improve Ability to disclose and discuss suicidal ideas Ability to demonstrate self-control will improve Ability to identify and develop effective coping behaviors will improve Ability to maintain clinical measurements within normal limits will improve Compliance with prescribed medications will improve Ability to identify triggers associated with substance abuse/mental health issues will improve NA  Medication Management: Evaluate patient's response, side effects, and tolerance of medication regimen.  Therapeutic Interventions: 1 to 1 sessions, Unit Group sessions and Medication administration.  Evaluation of Outcomes: Progressing  Physician Treatment Plan for Secondary Diagnosis: Principal Problem:   Undifferentiated schizophrenia ([place]) Active Problems:   Noncompliance with treatment  Long Term Goal(s): Improvement in symptoms so as ready for discharge NA   Short Term Goals: Ability to identify changes in lifestyle to reduce recurrence of condition will improve Ability to verbalize feelings will improve Ability to disclose and discuss suicidal ideas Ability to demonstrate self-control will improve Ability  to identify and develop effective coping behaviors will improve Ability to maintain clinical measurements within normal limits will improve Compliance with prescribed medications  will improve Ability to identify triggers associated with substance abuse/mental health issues will improve NA     Medication Management: Evaluate patient's response, side effects, and tolerance of medication regimen.  Therapeutic Interventions: 1 to 1 sessions, Unit Group sessions and Medication administration.  Evaluation of Outcomes: Progressing   RN Treatment Plan for Primary Diagnosis: Undifferentiated schizophrenia ([place]) Long Term Goal(s): Knowledge of disease and therapeutic regimen to maintain health will improve  Short Term Goals: Ability to demonstrate self-control, Ability to identify and develop effective coping behaviors will improve and Compliance with prescribed medications will improve  Medication Management: RN will administer medications as ordered by provider, will assess and evaluate patient's response and provide education to patient for prescribed medication. RN will report any adverse and/or side effects to prescribing provider.  Therapeutic Interventions: 1 on 1 counseling sessions, Psychoeducation, Medication administration, Evaluate responses to treatment, Monitor vital signs and CBGs as ordered, Perform/monitor CIWA, COWS, AIMS and Fall Risk screenings as ordered, Perform wound care treatments as ordered.  Evaluation of Outcomes: Progressing   LCSW Treatment Plan for Primary Diagnosis: Undifferentiated schizophrenia ([place]) Long Term Goal(s): Safe transition to appropriate next level of care at discharge, Engage patient in therapeutic group addressing interpersonal concerns.  Short Term Goals: Engage patient in aftercare planning with referrals and resources and Increase skills for wellness and recovery  Therapeutic Interventions: Assess for all discharge needs, 1 to 1 time with Social worker, Explore available resources and support systems, Assess for adequacy in community support network, Educate family and significant other(s) on suicide prevention, Complete  Psychosocial Assessment, Interpersonal group therapy.  Evaluation of Outcomes: Progressing   Progress in Treatment: Attending groups: No. Participating in groups: No. Taking medication as prescribed: Yes. Toleration medication: Yes. Family/Significant other contact made: No, will contact:  CSW will contact pt's brother, Louie Casa, per his request. Patient understands diagnosis: No. Discussing patient identified problems/goals with staff: Yes. Medical problems stabilized or resolved: Yes. Denies suicidal/homicidal ideation: Yes. Issues/concerns per patient self-inventory: No. Other: n/a  New problem(s) identified: No, Describe:  No new problems identified  New Short Term/Long Term Goal(s): Benjamin Braun was unable to clearly state a goal. He continues to struggle with a delusional thought pattern.  Discharge Plan or Barriers: Tentative discharge plan is for Jameer to discharge back to his home with ACT services in place.  Reason for Continuation of Hospitalization: Delusions  Hallucinations Medication stabilization  Estimated Length of Stay: 5-7 days  Attendees: Patient: Benjamin Braun 07/18/2017 3:32 PM  Physician: Heide Guile, MD 07/18/2017 3:32 PM  Nursing: Eugenio Hoes, RN 07/18/2017 3:32 PM  RN Care Manager: 07/18/2017 3:32 PM  Social Worker: Derrek Gu, LCSW 07/18/2017 3:32 PM  Recreational Therapist:  07/18/2017 3:32 PM  Other:  07/18/2017 3:32 PM  Other:  07/18/2017 3:32 PM  Other: 07/18/2017 3:32 PM    Scribe for Treatment Team: Devona Konig, LCSW 07/18/2017 3:32 PM

## 2017-07-18 NOTE — BHH Counselor (Signed)
Adult Comprehensive Assessment  Patient ID: Benjamin Braun, male   DOB: Nov 30, 1965, 52 y.o.   MRN: [id number]  Information Source: Information source: Patient(Information was also gathered from medical record due to pt having delusional thinking at times during the assessment interview and having an inability to give coherent answers to questions.)  Current Stressors:  Educational / Learning stressors: None noted Employment / Job issues: Pt does not currently work.  He receives SSDI benefits Family Relationships: Pt receives support from his brother, Benjamin Braun / Lack of resources (include bankruptcy): No issues noted.  Pt receives SSDI benefits Housing / Lack of housing: Housing is stable Physical health (include injuries & life threatening diseases): Pt is hard of hearing and wears hearing aids Social relationships: Pt gets along with one of his neighbors well.  Pt can be paranoid and has called the police several times reporting on his neighbors. Substance abuse: None Bereavement / Loss: No recent reported.  Pt does talk about the death of his step-brother, but it unknown the timeframe of this death  Living/Environment/Situation:  Living Arrangements: Alone Living conditions (as described by patient or guardian): pt is unable to give an answer How long has patient lived in current situation?: pt is unable to give an answer What is atmosphere in current home: Comfortable  Family History:  Marital status: Single Are you sexually active?: No What is your sexual orientation?: Heterosexual Has your sexual activity been affected by drugs, alcohol, medication, or emotional stress?: No Does patient have children?: No  Childhood History:  By whom was/is the patient raised?: Both parents Additional childhood history information: Pt's mother died when he was a child. Pt reported that his mother was murdered by his step-mother. Description of patient's relationship with caregiver when  they were a child: pt is unable to give an answer Patient's description of current relationship with people who raised him/her: pt is unable to give an answer How were you disciplined when you got in trouble as a child/adolescent?: pt is unable to give an answer Does patient have siblings?: Yes Number of Siblings: 1 Description of patient's current relationship with siblings: Pt's brother has been very supportive of him Did patient suffer any verbal/emotional/physical/sexual abuse as a child?: No Did patient suffer from severe childhood neglect?: No Has patient ever been sexually abused/assaulted/raped as an adolescent or adult?: No Was the patient ever a victim of a crime or a disaster?: No Witnessed domestic violence?: No Has patient been effected by domestic violence as an adult?: No  Education:  Highest grade of school patient has completed: Pt received a GED Currently a student?: No Name of school: n/a Learning disability?: No  Employment/Work Situation:   Employment situation: On disability Why is patient on disability: Psychiatric How long has patient been on disability: more than 10 years Patient's job has been impacted by current illness: Yes Describe how patient's job has been impacted: Pt was unable to maintain steady employment due to his mental illness What is the longest time patient has a held a job?: 2 years 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: None If attempted suicide, did drugs/alcohol play a role in this?: No Alcohol/Substance Abuse Treatment Hx: Denies past history If yes, describe treatment: n/a Has alcohol/substance abuse ever caused legal problems?: No  Social Support System:   [place]itage manager System: Poor Describe Community Support System: Pt tends to stay in his home due to his paranoia and psychotic behavior; he has caused problems with neighbors due to him calling the police frequently Type of faith/religion: None identifed How does patient's faith help to cope with current illness?: n/a  Leisure/Recreation:   Leisure and Hobbies: Pt enjoys looking at and caring for his exotic fish  Strengths/Needs:   What things does the patient do well?: Pt did not answer question In what areas does patient struggle / problems for patient: Psychiatric Instability due to non-compliance with medications and outpatient follow-up  Discharge Plan:   Does patient have access to transportation?: Yes(Pt has access to public transportation and assistance from his brother) Plan for no access to transportation at discharge: n/a Will patient be returning to same living situation after discharge?: Yes Currently receiving community mental health services: (pt was referred to ACT services with [facility] at last inpatient discharge.  He will be referred again for ACT services prior to discharge) If no, would patient like referral for services when discharged?: (n/a) Does patient have financial barriers related to discharge medications?: No  Summary/Recommendations:   Summary and Recommendations (to be completed by the evaluator): Pt is a 52 yo male from [place], [place] ([place]) who was admitted IVC for the ER.  Pt has been dx with Schizophrenia. Pt was brought to the ER via the sheriff after being initially taken to the  crisis unit at [facility]. Pt had been making multiple calls to 911. Pt was experiencing delusional behavior, incoherent speech, and psychosis.  Pt has had multiple inpatient admissions with last discharge occurring approximately two weeks ago.  Pt was referred to [facility] for ACT services, but services were never initiated due to pt being paranoid and not signing consent for services.  Pt has been off of his psychotropic medication for two weeks.  Pt continues to exhibit delusional thinking and paranoia.  Recommendation for pt include crisis stabilization, medication management, therapeutic milieu, encouragement of attendance and participation in groups, and development of a comprehensive wellness plan.  Tentative discharge plan is for pt to return back to his home with ACT services.  The ACT Team Leader has agreed to begin working with pt prior to him discharging from the hospital in order to build rapport and trust.  Benjamin Konig, LCSW. 07/18/2017

## 2017-07-18 NOTE — Plan of Care (Signed)
Patient is alert with disorganized thinking. Patient denies SI, HI and AVH. Patient refused morning medication, but did eat his breakfast and wanted to rest. Patient is pleasant and has no complaints this morning. Patient does not interact with other peers, patient stands back in the hall way and watches peers in the day room.Vital signs are within normal limits.Nurse will continue to monitor.  Activity: Will verbalize the importance of balancing activity with adequate rest periods 07/18/2017 1237 - Not Progressing by Geraldo Docker, RN 07/18/2017 1235 - Progressing by Geraldo Docker, RN   Education: Will be free of psychotic symptoms 07/18/2017 1237 - Progressing by Geraldo Docker, RN 07/18/2017 1235 - Progressing by Geraldo Docker, RN Knowledge of the prescribed therapeutic regimen will improve 07/18/2017 1237 - Not Progressing by Geraldo Docker, RN 07/18/2017 1235 - Progressing by Geraldo Docker, RN   Coping: Ability to cope will improve 07/18/2017 1237 - Not Progressing by Geraldo Docker, RN 07/18/2017 1235 - Progressing by Geraldo Docker, RN Ability to verbalize feelings will improve 07/18/2017 1237 - Progressing by Geraldo Docker, RN 07/18/2017 1235 - Progressing by Geraldo Docker, RN

## 2017-07-19 LAB — CBC WITH DIFFERENTIAL/PLATELET
Basophils Absolute: 0.1 10*3/uL (ref 0–0.1)
Basophils Relative: 1 %
EOS PCT: 2 %
Eosinophils Absolute: 0.2 10*3/uL (ref 0–0.7)
HCT: 43.7 % (ref 40.0–52.0)
HEMOGLOBIN: 15 g/dL (ref 13.0–18.0)
LYMPHS ABS: 2.8 10*3/uL (ref 1.0–3.6)
LYMPHS PCT: 34 %
MCH: 29.3 pg (ref 26.0–34.0)
MCHC: 34.4 g/dL (ref 32.0–36.0)
MCV: 84.9 fL (ref 80.0–100.0)
Monocytes Absolute: 0.8 10*3/uL (ref 0.2–1.0)
Monocytes Relative: 10 %
Neutro Abs: 4.4 10*3/uL (ref 1.4–6.5)
Neutrophils Relative %: 53 %
PLATELETS: 258 10*3/uL (ref 150–440)
RBC: 5.14 MIL/uL (ref 4.40–5.90)
RDW: 13.3 % (ref 11.5–14.5)
WBC: 8.2 10*3/uL (ref 3.8–10.6)

## 2017-07-19 NOTE — Consult Note (Signed)
MEDICATION RELATED CONSULT NOTE - INITIAL   Pharmacy Consult for Clozapine labs monitoring and REMs program reporting   Patient Measurements: Height: 5\' 6"  (167.6 cm) Weight: 207 lb (93.9 kg) IBW/kg (Calculated) : 63.8  Vital Signs:   Intake/Output from previous day: 02/08 0701 - 02/09 0700 In: 1320 [P.O.:1320] Out: -  Intake/Output from this shift: No intake/output data recorded.  Labs: Recent Labs    07/19/17 1700  WBC 8.2  HGB 15.0  HCT 43.7  PLT 258   Estimated Creatinine Clearance: 86.4 mL/min (by C-G formula based on SCr of 1.06 mg/dL).  Medical History: Past Medical History:  Diagnosis Date  . Anxiety   . Generalized anxiety disorder   . Schizophrenia [facility])     Assessment: Pharmacy consulted for clozapine lab monitoring and REMS Reporting in 52 yo male with PMH of schizophrenia.   Plan:  2/9 ANC 4400. Lab has been reported to online REMS Program. Patient is eligible to receive clozapine with weekly lab monitoring and reporting.  Next CBC w/Diff 2/16.  Pernell Dupre, PharmD, BCPS Clinical Pharmacist 07/19/2017 7:55 PM

## 2017-07-19 NOTE — Plan of Care (Signed)
Patient is alert and oriented. Patient denies SI, HI. Patient continues to believe his neighbors has killed his family. Patient is pleasant on the unit, and compliant with his medications today. Patient will go to the dayroom with peers, but majority of the time patient is in his room or out in the hallway. Patient's vitals blood pressure 92/67; pulse 91, respirations 16. Medication education provided but reinforcement is needed. RN will continue to monitor. Safety checks continue Q 15 minutes. Activity: Will verbalize the importance of balancing activity with adequate rest periods 07/19/2017 1100 - Progressing by Geraldo Docker, RN   Education: Will be free of psychotic symptoms 07/19/2017 1100 - Progressing by Geraldo Docker, RN Knowledge of the prescribed therapeutic regimen will improve 07/19/2017 1100 - Progressing by Geraldo Docker, RN   Coping: Ability to cope will improve 07/19/2017 1100 - Progressing by Geraldo Docker, RN Ability to verbalize feelings will improve 07/19/2017 1100 - Progressing by Geraldo Docker, RN   Nutritional: Ability to achieve adequate nutritional intake will improve 07/19/2017 1100 - Progressing by Geraldo Docker, RN

## 2017-07-19 NOTE — Plan of Care (Signed)
  Progressing Activity: Will verbalize the importance of balancing activity with adequate rest periods 07/19/2017 2205 - Progressing by Derek Mound, RN Education: Knowledge of the prescribed therapeutic regimen will improve 07/19/2017 2205 - Progressing by Derek Mound, RN Coping: Ability to cope will improve 07/19/2017 2205 - Progressing by Derek Mound, RN Ability to verbalize feelings will improve 07/19/2017 2205 - Progressing by Derek Mound, RN Safety: Ability to redirect hostility and anger into socially appropriate behaviors will improve 07/19/2017 2205 - Progressing by Derek Mound, RN Coping: Level of anxiety will decrease 07/19/2017 2205 - Progressing by Derek Mound, RN

## 2017-07-19 NOTE — [facility] Group Notes (Signed)
LCSW Group Therapy Note   07/19/2017 1:15pm   Type of Therapy and Topic:  Group Therapy:  Trust and Honesty  Participation Level:  Did Not Attend  Description of Group:    In this group patients will be asked to explore the value of being honest.  Patients will be guided to discuss their thoughts, feelings, and behaviors related to honesty and trusting in others. Patients will process together how trust and honesty relate to forming relationships with peers, family members, and self. Each patient will be challenged to identify and express feelings of being vulnerable. Patients will discuss reasons why people are dishonest and identify alternative outcomes if one was truthful (to self or others). This group will be process-oriented, with patients participating in exploration of their own experiences, giving and receiving support, and processing challenge from other group members.   Therapeutic Goals: 1. Patient will identify why honesty is important to relationships and how honesty overall affects relationships.  2. Patient will identify a situation where they lied or were lied too and the  feelings, thought process, and behaviors surrounding the situation 3. Patient will identify the meaning of being vulnerable, how that feels, and how that correlates to being honest with self and others. 4. Patient will identify situations where they could have told the truth, but instead lied and explain reasons of dishonesty.   Summary of Patient Progress: Pt invited to group but did not attend.     Therapeutic Modalities:   Cognitive Behavioral Therapy Solution Focused Therapy Motivational Interviewing Brief Therapy  Anitha Kreiser  CUEBAS-COLON, LCSW 07/19/2017 10:28 AM

## 2017-07-19 NOTE — Progress Notes (Signed)
[facility] MD Progress Note  07/19/2017 8:04 AM Benjamin Braun  MRN:  [id number]  Subjective:  Benjamin Braun is loud in the afternoon but sleeps late in the morning. He is difficult to arouse in part due to hearing loss. He absolutely denies any problems wuth depression, anxiety or psychosis but is arguing with the voices and very paranoid. There are fixed delusions about his neighbors that get hin in trouble with the law especially that he is noncompliant with medications.   Treatment plan. The patient received Haldol decanoate injection on 07/17/2017. He probaby need injection every 3 weeks. We restarted Zyprexa 15 mg and haldol 10 mg nightly. We started Clozapine last night. The patient accepted it. If he continues to agree to take clozapine, I will discontinue Zyprexa and oral Haldol with time.   Social/disposition. Lives independently but so far was too paranoid to work with ACT team.  Principal Problem: Undifferentiated schizophrenia ([place]) Diagnosis:   Patient Active Problem List   Diagnosis Date Noted  . Undifferentiated schizophrenia ([place]) [F20.3] 11/04/2016    Priority: High  . Noncompliance with treatment [Z91.19] 04/14/2015   Total Time spent with patient: 20 minutes  Past Psychiatric History: schizophrenia  Past Medical History:  Past Medical History:  Diagnosis Date  . Anxiety   . Generalized anxiety disorder   . Schizophrenia [facility])     Past Surgical History:  Procedure Laterality Date  . TONSILLECTOMY     Family History:  Family History  Family history unknown: Yes   Family Psychiatric  History: none reported Social History:  Social History   Substance and Sexual Activity  Alcohol Use No  . Alcohol/week: 0.0 oz     Social History   Substance and Sexual Activity  Drug Use No    Social History   Socioeconomic History  . Marital status: Single    Spouse name: None  . Number of children: None  . Years of education: None  . Highest education level: None  Social  Needs  . Financial resource strain: None  . Food insecurity - worry: None  . Food insecurity - inability: None  . Transportation needs - medical: None  . Transportation needs - non-medical: None  Occupational History  . None  Tobacco Use  . Smoking status: Former Smoker    Types: Cigarettes    Last attempt to quit: 06/11/1999    Years since quitting: 18.1  . Smokeless tobacco: Never Used  Substance and Sexual Activity  . Alcohol use: No    Alcohol/week: 0.0 oz  . Drug use: No  . Sexual activity: No  Other Topics Concern  . None  Social History Narrative  . None   Additional Social History:                         Sleep: Fair  Appetite:  Fair  Current Medications: Current Facility-Administered Medications  Medication Dose Route Frequency Provider Last Rate Last Dose  . acetaminophen (TYLENOL) tablet 650 mg  650 mg Oral Q6H PRN Clapacs, [name], MD   650 mg at 07/19/17 0027  . alum & mag hydroxide-simeth (MAALOX/MYLANTA) 200-200-20 MG/5ML suspension 30 mL  30 mL Oral Q4H PRN Clapacs, [name], MD      . cloZAPine (CLOZARIL) tablet 50 mg  50 mg Oral QHS Adrine Hayworth B, MD   50 mg at 07/18/17 2118  . divalproex (DEPAKOTE) DR tablet 500 mg  500 mg Oral Q12H Clapacs, [name], MD  500 mg at 07/18/17 2118  . haloperidol (HALDOL) tablet 10 mg  10 mg Oral QHS Marzetta Lanza B, MD   10 mg at 07/18/17 2118  . haloperidol decanoate (HALDOL DECANOATE) 100 MG/ML injection 100 mg  100 mg Intramuscular Q28 days Zadok Holaway B, MD   100 mg at 07/17/17 0851  . magnesium hydroxide (MILK OF MAGNESIA) suspension 30 mL  30 mL Oral Daily PRN Clapacs, [name], MD      . OLANZapine (ZYPREXA) tablet 15 mg  15 mg Oral QHS Clapacs, [name], MD   15 mg at 07/18/17 2118    Lab Results:  Results for orders placed or performed during the hospital encounter of 07/16/17 (from the past 48 hour(s))  Clozapine (clozaril)     Status: Abnormal   Collection Time: 07/17/17 12:23 PM   Result Value Ref Range   Clozapine Lvl 24 (L) 350 - 650 ng/mL    Comment: (NOTE) This test was developed and its performance characteristics determined by [facility]. It has not been cleared or approved by the Food and Drug Administration.    NorClozapine 56 Not Estab. ng/mL    Comment: (NOTE) This test was developed and its performance characteristics determined by [facility]. It has not been cleared or approved by the Food and Drug Administration.    Total(Cloz+Norcloz) 80 ng/mL    Comment: (NOTE) Patients dosed with 400 mg clozapine daily for 4 weeks were most likely to exhibit a therapeutic effect when the sum of clozapine and norclozapine concentrations were at least 450 ng/mL. Vira Agar, et al. Rexford Maus Consensus Guidelines for Therapeutic Drug Monitoring in Psychiatry: Update 2011, Pharmacopsychiatry Sep 2011; 44(6):195-235.                                Detection Limit = 20 Performed At: [facility] [place], [place] [id number] Rush Farmer MD OE:[id number] Performed at [facility], [address] [place], [place] [address]     Blood Alcohol level:  Lab Results  Component Value Date   [facility] <10 07/15/2017   ETH <10 71/69/6789    Metabolic Disorder Labs: Lab Results  Component Value Date   HGBA1C 5.4 06/19/2017   MPG 108.28 06/19/2017   MPG 111 11/05/2016   No results found for: PROLACTIN Lab Results  Component Value Date   CHOL 240 (H) 06/19/2017   TRIG 290 (H) 06/19/2017   HDL 36 (L) 06/19/2017   CHOLHDL 6.7 06/19/2017   VLDL 58 (H) 06/19/2017   LDLCALC 146 (H) 06/19/2017   LDLCALC 115 (H) 11/05/2016    Physical Findings: AIMS: Facial and Oral Movements Muscles of Facial Expression: None, normal Lips and Perioral Area: None, normal Jaw: None, normal Tongue: None, normal,Extremity Movements Upper (arms, wrists, hands, fingers): None, normal Lower (legs, knees, ankles, toes): None, normal, Trunk  Movements Neck, shoulders, hips: None, normal, Overall Severity Severity of abnormal movements (highest score from questions above): None, normal Incapacitation due to abnormal movements: None, normal Patient's awareness of abnormal movements (rate only patient's report): No Awareness, Dental Status Current problems with teeth and/or dentures?: Yes(Poor dentition) Does patient usually wear dentures?: No  CIWA:    COWS:     Musculoskeletal: Strength & Muscle Tone: within normal limits Gait & Station: normal Patient leans: N/A  Psychiatric Specialty Exam: Physical Exam  Nursing note and vitals reviewed. Psychiatric: His mood appears anxious. His speech is rapid  and/or pressured. He is hyperactive and actively hallucinating. Thought content is delusional. Cognition and memory are normal. He expresses impulsivity.    Review of Systems  Neurological: Negative.   Psychiatric/Behavioral: Positive for hallucinations.  All other systems reviewed and are negative.   Blood pressure 92/67, pulse 91, temperature 98.3 F (36.8 C), temperature source Oral, resp. rate 16, height 5\' 6"  (1.676 m), weight 93.9 kg (207 lb), SpO2 96 %.Body mass index is 33.41 kg/m.  General Appearance: Casual  Eye Contact:  Good  Speech:  Clear and Coherent  Volume:  Increased  Mood:  Euphoric  Affect:  Congruent  Thought Process:  Linear and Descriptions of Associations: Tangential  Orientation:  Full (Time, Place, and Person)  Thought Content:  Delusions, Hallucinations: Auditory and Paranoid Ideation  Suicidal Thoughts:  No  Homicidal Thoughts:  No  Memory:  Immediate;   Fair Recent;   Fair Remote;   Fair  Judgement:  Poor  Insight:  Lacking  Psychomotor Activity:  Normal  Concentration:  Concentration: Fair and Attention Span: Fair  Recall:  [employer] of Knowledge:  Fair  Language:  Fair  Akathisia:  No  Handed:  Right  AIMS (if indicated):     Assets:  Communication Skills Desire for  Improvement Financial Resources/Insurance Housing Physical Health Resilience Social Support  ADL's:  Intact  Cognition:  WNL  Sleep:  Number of Hours: 6.75     Treatment Plan Summary: Daily contact with patient to assess and evaluate symptoms and progress in treatment and Medication management   Benjamin Braun is a 52 year old male with a history of schizophrenia admitted floridly psychotic in the context of medication noncompliance.  #Psychosis -continue Zyprexa 15 mg nightly -continueHaldol decanoate100 mgmonthly injections, next injection on 3/7 -continue Depakote500 mg BID -continue Haldol 10 mg nightly -startClozapinetitration, today 50 mg nightly  #Metabilic syndrome monitoring -Lipid panel, TSH and HgbA1C were recently done -EKG, QTc 435  #Disposition -TBE -discharge to his apartment -followup with [facility] ACT teamon outpatient commitment    Orson Slick, MD 07/19/2017, 8:04 AM

## 2017-07-19 NOTE — Progress Notes (Addendum)
Patient found in day room upon my arrival. Patient is visible and moderately social throughout the evening. Patient is pleasant and affect is animated. Patient displays a sense of humor and the ability to converse normally. Continue to be delusional but pleasantly so. Denies SI, HI, AVH, anxiety, and depression. Reports eating and voiding adequately. Compliant with HS medications and staff direction. Q 15 minute checks maintained. Will continue to monitor throughout the shift. @2305 , patient complains of toothache, given Tylenol with positive results. Patient requests an antibiotic @0342 . States, "I think I need to start taking something to get rid of the infection instead of just taking a bunch of Tylenol." Will endorse to oncoming shift for notification of provider in the am.  Patient slept 7.75 hours. No apparent distress. Will endorse care to oncoming shift.

## 2017-07-20 MED ORDER — CLOZAPINE 100 MG PO TABS
100.0000 mg | ORAL_TABLET | Freq: Every day | ORAL | Status: DC
  Administered 2017-07-20 – 2017-07-21 (×2): 100 mg via ORAL
  Filled 2017-07-20 (×2): qty 1

## 2017-07-20 MED ORDER — AMOXICILLIN 500 MG PO CAPS
500.0000 mg | ORAL_CAPSULE | Freq: Three times a day (TID) | ORAL | Status: DC
  Administered 2017-07-20 – 2017-07-27 (×22): 500 mg via ORAL
  Filled 2017-07-20 (×28): qty 1

## 2017-07-20 NOTE — Progress Notes (Signed)
[facility] MD Progress Note  07/20/2017 8:50 AM Pope Brunty  MRN:  [id number]  Subjective:  Mr. Jarrett Ables complains of tooth ache and abscess. It responded to Tylenol in the past but now seems worse. There is no longer headache that troubled him before. He is paranoid and actively hallucinating but adamantly denies any symptoms.   Treatment plan. We will continue Haldol oral and injectable. He likely nedds higher and/or more frequent dose. Continue Depakote and Clozapine titration. Offer Amocicillin for abscess.  Social/disposition. Discharge to his apartment. Follow up with [facility] ACT team.  Principal Problem: Undifferentiated schizophrenia [facility]) Diagnosis:   Patient Active Problem List   Diagnosis Date Noted  . Undifferentiated schizophrenia ([place]) [F20.3] 11/04/2016    Priority: High  . Noncompliance with treatment [Z91.19] 04/14/2015   Total Time spent with patient: 20 minutes  Past Psychiatric History: schizophrenia  Past Medical History:  Past Medical History:  Diagnosis Date  . Anxiety   . Generalized anxiety disorder   . Schizophrenia [facility])     Past Surgical History:  Procedure Laterality Date  . TONSILLECTOMY     Family History:  Family History  Family history unknown: Yes   Family Psychiatric  History: none reported Social History:  Social History   Substance and Sexual Activity  Alcohol Use No  . Alcohol/week: 0.0 oz     Social History   Substance and Sexual Activity  Drug Use No    Social History   Socioeconomic History  . Marital status: Single    Spouse name: None  . Number of children: None  . Years of education: None  . Highest education level: None  Social Needs  . Financial resource strain: None  . Food insecurity - worry: None  . Food insecurity - inability: None  . Transportation needs - medical: None  . Transportation needs - non-medical: None  Occupational History  . None  Tobacco Use  . Smoking status: Former Smoker   Types: Cigarettes    Last attempt to quit: 06/11/1999    Years since quitting: 18.1  . Smokeless tobacco: Never Used  Substance and Sexual Activity  . Alcohol use: No    Alcohol/week: 0.0 oz  . Drug use: No  . Sexual activity: No  Other Topics Concern  . None  Social History Narrative  . None   Additional Social History:                         Sleep: Fair  Appetite:  Fair  Current Medications: Current Facility-Administered Medications  Medication Dose Route Frequency Provider Last Rate Last Dose  . acetaminophen (TYLENOL) tablet 650 mg  650 mg Oral Q6H PRN Clapacs, [name], MD   650 mg at 07/19/17 2305  . alum & mag hydroxide-simeth (MAALOX/MYLANTA) 200-200-20 MG/5ML suspension 30 mL  30 mL Oral Q4H PRN Clapacs, [name], MD      . amoxicillin (AMOXIL) capsule 500 mg  500 mg Oral Q8H Kire Ferg B, MD      . cloZAPine (CLOZARIL) tablet 50 mg  50 mg Oral QHS Tamitha Norell B, MD   50 mg at 07/19/17 2139  . divalproex (DEPAKOTE) DR tablet 500 mg  500 mg Oral Q12H Clapacs, [name], MD   500 mg at 07/19/17 2139  . haloperidol (HALDOL) tablet 10 mg  10 mg Oral QHS Demani Weyrauch B, MD   10 mg at 07/19/17 2139  . haloperidol decanoate (HALDOL DECANOATE) 100 MG/ML  injection 100 mg  100 mg Intramuscular Q28 days Devora Tortorella B, MD   100 mg at 07/17/17 0851  . magnesium hydroxide (MILK OF MAGNESIA) suspension 30 mL  30 mL Oral Daily PRN Clapacs, [name], MD      . OLANZapine (ZYPREXA) tablet 15 mg  15 mg Oral QHS Clapacs, [name], MD   15 mg at 07/19/17 2139    Lab Results:  Results for orders placed or performed during the hospital encounter of 07/16/17 (from the past 48 hour(s))  CBC with Differential/Platelet     Status: None   Collection Time: 07/19/17  5:00 PM  Result Value Ref Range   WBC 8.2 3.8 - 10.6 K/uL   RBC 5.14 4.40 - 5.90 MIL/uL   Hemoglobin 15.0 13.0 - 18.0 g/dL   HCT 43.7 40.0 - 52.0 %   MCV 84.9 80.0 - 100.0 fL   MCH 29.3 26.0 - 34.0  pg   MCHC 34.4 32.0 - 36.0 g/dL   RDW 13.3 11.5 - 14.5 %   Platelets 258 150 - 440 K/uL   Neutrophils Relative % 53 %   Neutro Abs 4.4 1.4 - 6.5 K/uL   Lymphocytes Relative 34 %   Lymphs Abs 2.8 1.0 - 3.6 K/uL   Monocytes Relative 10 %   Monocytes Absolute 0.8 0.2 - 1.0 K/uL   Eosinophils Relative 2 %   Eosinophils Absolute 0.2 0 - 0.7 K/uL   Basophils Relative 1 %   Basophils Absolute 0.1 0 - 0.1 K/uL    Comment: Performed at [facility], [address] [place], [place] [address]    Blood Alcohol level:  Lab Results  Component Value Date   [facility] <10 07/15/2017   ETH <10 62/83/1517    Metabolic Disorder Labs: Lab Results  Component Value Date   HGBA1C 5.4 06/19/2017   MPG 108.28 06/19/2017   MPG 111 11/05/2016   No results found for: PROLACTIN Lab Results  Component Value Date   CHOL 240 (H) 06/19/2017   TRIG 290 (H) 06/19/2017   HDL 36 (L) 06/19/2017   CHOLHDL 6.7 06/19/2017   VLDL 58 (H) 06/19/2017   LDLCALC 146 (H) 06/19/2017   LDLCALC 115 (H) 11/05/2016    Physical Findings: AIMS: Facial and Oral Movements Muscles of Facial Expression: None, normal Lips and Perioral Area: None, normal Jaw: None, normal Tongue: None, normal,Extremity Movements Upper (arms, wrists, hands, fingers): None, normal Lower (legs, knees, ankles, toes): None, normal, Trunk Movements Neck, shoulders, hips: None, normal, Overall Severity Severity of abnormal movements (highest score from questions above): None, normal Incapacitation due to abnormal movements: None, normal Patient's awareness of abnormal movements (rate only patient's report): No Awareness, Dental Status Current problems with teeth and/or dentures?: Yes(Poor dentition) Does patient usually wear dentures?: No  CIWA:    COWS:     Musculoskeletal: Strength & Muscle Tone: within normal limits Gait & Station: normal Patient leans: N/A  Psychiatric Specialty Exam: Physical Exam  Nursing note and vitals  reviewed. Psychiatric: His speech is normal. His affect is blunt. He is actively hallucinating. Thought content is paranoid and delusional. Cognition and memory are normal. He expresses impulsivity.    Review of Systems  HENT: Positive for hearing loss.        Tooth ache  Neurological: Negative.   Psychiatric/Behavioral: Positive for hallucinations.  All other systems reviewed and are negative.   Blood pressure 92/67, pulse 91, temperature 98.3 F (36.8 C), temperature source Oral, resp. rate 16,  height 5\' 6"  (1.676 m), weight 93.9 kg (207 lb), SpO2 96 %.Body mass index is 33.41 kg/m.  General Appearance: Casual  Eye Contact:  Good  Speech:  Clear and Coherent  Volume:  Normal  Mood:  Dysphoric  Affect:  Congruent  Thought Process:  Irrelevant, Linear and Descriptions of Associations: Tangential  Orientation:  Full (Time, Place, and Person)  Thought Content:  Delusions, Hallucinations: Auditory and Paranoid Ideation  Suicidal Thoughts:  No  Homicidal Thoughts:  No  Memory:  Immediate;   Fair Recent;   Fair Remote;   Fair  Judgement:  Poor  Insight:  Lacking  Psychomotor Activity:  Normal  Concentration:  Concentration: Fair and Attention Span: Fair  Recall:  [employer] of Knowledge:  Fair  Language:  Fair  Akathisia:  No  Handed:  Right  AIMS (if indicated):     Assets:  Communication Skills Desire for Improvement Financial Resources/Insurance Housing Physical Health Resilience Social Support  ADL's:  Intact  Cognition:  WNL  Sleep:  Number of Hours: 7.75     Treatment Plan Summary: Daily contact with patient to assess and evaluate symptoms and progress in treatment and Medication management   Mr. Diebold is a 52 year old male with a history of schizophrenia admitted floridly psychotic in the context of medication noncompliance.  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

## 2017-07-20 NOTE — Plan of Care (Signed)
Patient is alert and oriented to himself, place and time. Patient denies SI, HI and AVH. Patient complains today of toothache, now taking antibiotic to help manage symptoms and tylenol. Patient states," I am not a danger to society or myself so keeping me here is breaking [place] law, y'all  not above [place] law." Patient refused medications earlier but later after lunch came to take medication. Patient has been pleasant to staff and peers. Patient eating meals adequately. Patient has no concerns other than toothache and discharging. Nurse will continue to monitor. Activity: Will verbalize the importance of balancing activity with adequate rest periods 07/20/2017 1312 - Progressing by Geraldo Docker, RN   Education: Will be free of psychotic symptoms 07/20/2017 1312 - Progressing by Geraldo Docker, RN Knowledge of the prescribed therapeutic regimen will improve 07/20/2017 1312 - Progressing by Geraldo Docker, RN   Coping: Ability to cope will improve 07/20/2017 1312 - Progressing by Geraldo Docker, RN Ability to verbalize feelings will improve 07/20/2017 1312 - Progressing by Geraldo Docker, RN   Nutritional: Ability to achieve adequate nutritional intake will improve 07/20/2017 1312 - Progressing by Geraldo Docker, RN

## 2017-07-20 NOTE — [facility] Group Notes (Signed)
LCSW Group Therapy Note 07/20/2017 1:15pm  Type of Therapy and Topic: Group Therapy: Feelings Around Returning Home & Establishing a Supportive Framework and Supporting Oneself When Supports Not Available  Participation Level: Did Not Attend  Description of Group:  Patients first processed thoughts and feelings about upcoming discharge. These included fears of upcoming changes, lack of change, new living environments, judgements and expectations from others and overall stigma of mental health issues. The group then discussed the definition of a supportive framework, what that looks and feels like, and how do to discern it from an unhealthy non-supportive network. The group identified different types of supports as well as what to do when your family/friends are less than helpful or unavailable  Therapeutic Goals  1. Patient will identify one healthy supportive network that they can use at discharge. 2. Patient will identify one factor of a supportive framework and how to tell it from an unhealthy network. 3. Patient able to identify one coping skill to use when they do not have positive supports from others. 4. Patient will demonstrate ability to communicate their needs through discussion and/or role plays.  Summary of Patient Progress:  Pt did not attend group.   Therapeutic Modalities Cognitive Behavioral Therapy Motivational Interviewing   Shunna Mikaelian  CUEBAS-COLON, LCSW 07/20/2017 11:32 AM

## 2017-07-20 NOTE — Plan of Care (Signed)
  Progressing Activity: Will verbalize the importance of balancing activity with adequate rest periods 07/20/2017 2339 - Progressing by Corinna Capra, RN Education: Will be free of psychotic symptoms 07/20/2017 2339 - Progressing by Corinna Capra, RN Knowledge of the prescribed therapeutic regimen will improve 07/20/2017 2339 - Progressing by Corinna Capra, RN Coping: Ability to cope will improve 07/20/2017 2339 - Progressing by Corinna Capra, RN Ability to verbalize feelings will improve 07/20/2017 2339 - Progressing by Corinna Capra, RN Nutritional: Ability to achieve adequate nutritional intake will improve 07/20/2017 2339 - Progressing by Corinna Capra, RN Safety: Ability to redirect hostility and anger into socially appropriate behaviors will improve 07/20/2017 2339 - Progressing by Corinna Capra, RN Ability to remain free from injury will improve 07/20/2017 2339 - Progressing by Corinna Capra, RN Self-Care: Ability to participate in self-care as condition permits will improve 07/20/2017 2339 - Progressing by Corinna Capra, RN Self-Concept: Ability to verbalize positive feelings about self will improve 07/20/2017 2339 - Progressing by Corinna Capra, RN Education: Ability to state activities that reduce stress will improve 07/20/2017 2339 - Progressing by Corinna Capra, RN Self-Concept: Level of anxiety will decrease 07/20/2017 2339 - Progressing by Corinna Capra, RN Ability to modify response to factors that promote anxiety will improve 07/20/2017 2339 - Progressing by Corinna Capra, RN Education: Knowledge of [facility] Education information/materials will improve 07/20/2017 2339 - Progressing by Corinna Capra, RN Coping: Level of anxiety will decrease 07/20/2017 2339 - Progressing by Corinna Capra, RN Pain Managment: General experience of comfort will improve 07/20/2017 2339 - Progressing by Corinna Capra, RN

## 2017-07-21 NOTE — Progress Notes (Signed)
[facility] MD Progress Note  07/21/2017 5:05 PM Benjamin Braun  MRN:  [id number]  Subjective:    Benjamin Braun is very paranoid. He was supposed to meet with his ACT team but this morning, he started accusing them of tryingto kill him. We called the meeting off.   Treatment plan. Continue Haldol oral and injections, continue Clozapine titration and Depakote for psychosis and mood stabilization.  Social/disposition. The patient will return to his apartment. Follow up TBE.  Principal Problem: Undifferentiated schizophrenia ([place]) Diagnosis:   Patient Active Problem List   Diagnosis Date Noted  . Undifferentiated schizophrenia ([place]) [F20.3] 11/04/2016    Priority: High  . Noncompliance with treatment [Z91.19] 04/14/2015   Total Time spent with patient: 20 minutes  Past Psychiatric History: schizophrenia  Past Medical History:  Past Medical History:  Diagnosis Date  . Anxiety   . Generalized anxiety disorder   . Schizophrenia [facility])     Past Surgical History:  Procedure Laterality Date  . TONSILLECTOMY     Family History:  Family History  Family history unknown: Yes   Family Psychiatric  History: none reported Social History:  Social History   Substance and Sexual Activity  Alcohol Use No  . Alcohol/week: 0.0 oz     Social History   Substance and Sexual Activity  Drug Use No    Social History   Socioeconomic History  . Marital status: Single    Spouse name: None  . Number of children: None  . Years of education: None  . Highest education level: None  Social Needs  . Financial resource strain: None  . Food insecurity - worry: None  . Food insecurity - inability: None  . Transportation needs - medical: None  . Transportation needs - non-medical: None  Occupational History  . None  Tobacco Use  . Smoking status: Former Smoker    Types: Cigarettes    Last attempt to quit: 06/11/1999    Years since quitting: 18.1  . Smokeless tobacco: Never Used  Substance and  Sexual Activity  . Alcohol use: No    Alcohol/week: 0.0 oz  . Drug use: No  . Sexual activity: No  Other Topics Concern  . None  Social History Narrative  . None   Additional Social History:                         Sleep: Fair  Appetite:  Fair  Current Medications: Current Facility-Administered Medications  Medication Dose Route Frequency Provider Last Rate Last Dose  . acetaminophen (TYLENOL) tablet 650 mg  650 mg Oral Q6H PRN Clapacs, [name], MD   650 mg at 07/19/17 2305  . alum & mag hydroxide-simeth (MAALOX/MYLANTA) 200-200-20 MG/5ML suspension 30 mL  30 mL Oral Q4H PRN Clapacs, [name], MD      . amoxicillin (AMOXIL) capsule 500 mg  500 mg Oral Q8H Jodie Leiner B, MD   500 mg at 07/21/17 0619  . cloZAPine (CLOZARIL) tablet 100 mg  100 mg Oral QHS Kirin Pastorino B, MD   100 mg at 07/20/17 2121  . divalproex (DEPAKOTE) DR tablet 500 mg  500 mg Oral Q12H Clapacs, [name], MD   Stopped at 07/21/17 0840  . haloperidol (HALDOL) tablet 10 mg  10 mg Oral QHS Raesean Bartoletti B, MD   10 mg at 07/20/17 2121  . haloperidol decanoate (HALDOL DECANOATE) 100 MG/ML injection 100 mg  100 mg Intramuscular Q28 days Jazzma Neidhardt, Wardell Honour, MD  100 mg at 07/17/17 0851  . magnesium hydroxide (MILK OF MAGNESIA) suspension 30 mL  30 mL Oral Daily PRN Clapacs, [name], MD        Lab Results: No results found for this or any previous visit (from the past 48 hour(s)).  Blood Alcohol level:  Lab Results  Component Value Date   ETH <10 07/15/2017   ETH <10 96/78/9381    Metabolic Disorder Labs: Lab Results  Component Value Date   HGBA1C 5.4 06/19/2017   MPG 108.28 06/19/2017   MPG 111 11/05/2016   No results found for: PROLACTIN Lab Results  Component Value Date   CHOL 240 (H) 06/19/2017   TRIG 290 (H) 06/19/2017   HDL 36 (L) 06/19/2017   CHOLHDL 6.7 06/19/2017   VLDL 58 (H) 06/19/2017   LDLCALC 146 (H) 06/19/2017   LDLCALC 115 (H) 11/05/2016    Physical  Findings: AIMS: Facial and Oral Movements Muscles of Facial Expression: None, normal Lips and Perioral Area: None, normal Jaw: None, normal Tongue: None, normal,Extremity Movements Upper (arms, wrists, hands, fingers): None, normal Lower (legs, knees, ankles, toes): None, normal, Trunk Movements Neck, shoulders, hips: None, normal, Overall Severity Severity of abnormal movements (highest score from questions above): None, normal Incapacitation due to abnormal movements: None, normal Patient's awareness of abnormal movements (rate only patient's report): No Awareness, Dental Status Current problems with teeth and/or dentures?: Yes(Poor dentition) Does patient usually wear dentures?: No  CIWA:    COWS:     Musculoskeletal: Strength & Muscle Tone: within normal limits Gait & Station: normal Patient leans: N/A  Psychiatric Specialty Exam: Physical Exam  Nursing note and vitals reviewed. Psychiatric: His affect is angry. His speech is rapid and/or pressured. He is actively hallucinating. Thought content is paranoid and delusional. Cognition and memory are normal. He expresses impulsivity.    Review of Systems  Neurological: Negative.   Psychiatric/Behavioral: Positive for hallucinations.  All other systems reviewed and are negative.   Blood pressure 110/81, pulse 94, temperature 98.1 F (36.7 C), temperature source Oral, resp. rate 16, height 5\' 6"  (1.676 m), weight 93.9 kg (207 lb), SpO2 96 %.Body mass index is 33.41 kg/m.  General Appearance: Casual  Eye Contact:  Good  Speech:  Slurred  Volume:  Increased  Mood:  Dysphoric and Irritable  Affect:  Congruent and Inappropriate  Thought Process:  Disorganized and Descriptions of Associations: Loose  Orientation:  Full (Time, Place, and Person)  Thought Content:  Delusions, Hallucinations: Auditory and Paranoid Ideation  Suicidal Thoughts:  No  Homicidal Thoughts:  No  Memory:  Immediate;   Fair Recent;   Fair Remote;   Fair   Judgement:  Poor  Insight:  Lacking  Psychomotor Activity:  Normal  Concentration:  Concentration: Fair and Attention Span: Fair  Recall:  [employer] of Knowledge:  Fair  Language:  Fair  Akathisia:  No  Handed:  Right  AIMS (if indicated):     Assets:  Communication Skills Desire for Improvement Financial Resources/Insurance Housing Physical Health Resilience Social Support  ADL's:  Intact  Cognition:  WNL  Sleep:  Number of Hours: 7.15     Treatment Plan Summary: Daily contact with patient to assess and evaluate symptoms and progress in treatment and Medication management   Benjamin Braun is a 52 year old male with a history of schizophrenia admitted floridly psychotic in the context of medication noncompliance.  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

## 2017-07-21 NOTE — Plan of Care (Signed)
Patient is alert to self. Patient continues to have grandeur delusions of neighbors hurting his family. Patient refused morning medication but did come to take antibiotic later. Patient continues to have disorganized thinking and flight of ideas. Nurse informed patient of needing urine sample. Specimen cup placed in patients room. Activity: Will verbalize the importance of balancing activity with adequate rest periods 07/21/2017 1759 - Progressing by Geraldo Docker, RN   Education: Will be free of psychotic symptoms 07/21/2017 1759 - Progressing by Geraldo Docker, RN Knowledge of the prescribed therapeutic regimen will improve 07/21/2017 1759 - Not Progressing by Geraldo Docker, RN   Coping: Ability to cope will improve 07/21/2017 1759 - Not Progressing by Geraldo Docker, RN Ability to verbalize feelings will improve 07/21/2017 1759 - Not Progressing by Geraldo Docker, RN  Nurse will continue to monitor patient. Safety checks will continue Q 15 minutes.

## 2017-07-21 NOTE — [facility] Group Notes (Signed)
07/21/2017  Time: 0930   Type of Therapy and Topic:  Group Therapy:  Overcoming Obstacles   Participation Level:  Did Not Attend   Description of Group:   In this group patients will be encouraged to explore what they see as obstacles to their own wellness and recovery. They will be guided to discuss their thoughts, feelings, and behaviors related to these obstacles. The group will process together ways to cope with barriers, with attention given to specific choices patients can make. Each patient will be challenged to identify changes they are motivated to make in order to overcome their obstacles. This group will be process-oriented, with patients participating in exploration of their own experiences, giving and receiving support, and processing challenge from other group members.   Therapeutic Goals: 1. Patient will identify personal and current obstacles as they relate to admission. 2. Patient will identify barriers that currently interfere with their wellness or overcoming obstacles.  3. Patient will identify feelings, thought process and behaviors related to these barriers. 4. Patient will identify two changes they are willing to make to overcome these obstacles:      Summary of Patient Progress  Pt was invited to attend group but chose not to attend. CSW will continue to encourage pt to attend group throughout their admission.    Therapeutic Modalities:   Cognitive Behavioral Therapy Solution Focused Therapy Motivational Interviewing Relapse Prevention Therapy  Alden Hipp, MSW, LCSW 07/21/2017 10:45 AM

## 2017-07-22 MED ORDER — CLOZAPINE 25 MG PO TABS
150.0000 mg | ORAL_TABLET | Freq: Every day | ORAL | Status: DC
  Administered 2017-07-22 – 2017-07-23 (×2): 150 mg via ORAL
  Filled 2017-07-22 (×3): qty 1

## 2017-07-22 MED ORDER — HALOPERIDOL DECANOATE 100 MG/ML IM SOLN: 100.0000 mg | INTRAMUSCULAR | Status: DC

## 2017-07-22 NOTE — [facility] Group Notes (Signed)
07/22/2017 1PM  Type of Therapy/Topic:  Group Therapy:  Feelings about Diagnosis  Participation Level:  Did Not Attend   Description of Group:   This group will allow patients to explore their thoughts and feelings about diagnoses they have received. Patients will be guided to explore their level of understanding and acceptance of these diagnoses. Facilitator will encourage patients to process their thoughts and feelings about the reactions of others to their diagnosis and will guide patients in identifying ways to discuss their diagnosis with significant others in their lives. This group will be process-oriented, with patients participating in exploration of their own experiences, giving and receiving support, and processing challenge from other group members.   Therapeutic Goals: 1. Patient will demonstrate understanding of diagnosis as evidenced by identifying two or more symptoms of the disorder 2. Patient will be able to express two feelings regarding the diagnosis 3. Patient will demonstrate their ability to communicate their needs through discussion and/or role play  Summary of Patient Progress: Pt was invited to attend group but chose not to attend. CSW will continue to encourage pt to attend group throughout their admission.    Therapeutic Modalities:   Cognitive Behavioral Therapy Brief Therapy  Alden Hipp, MSW, LCSW 07/22/2017 1:56 PM

## 2017-07-22 NOTE — Progress Notes (Addendum)
Patient found in bed upon my arrival. Patient is neither visible nor social this evening. Reports bilateral flank pain, given Tylenol with positive results. Continues to be delusional regarding police and neighbors. Continues to believe God speaks to him. Denies SI, HI, AVH. Affect is worried. Patient reports that he met with the ACT Team 3 days ago and that he never heard back from them. Patient would like to follow up on that tomorrow. Reports eating and voiding adequately. Compliant with HS medications and staff direction. Q 15 minute checks maintained. Will continue to monitor throughout the shift. Patient slept 6.5 hours. No apparent distress. Will endorse care to oncoming shift.

## 2017-07-22 NOTE — Progress Notes (Signed)
[facility] MD Progress Note  07/22/2017 1:35 PM Benjamin Braun  MRN:  [id number]  Subjective:    Benjamin Braun is still paranoid, delusional and hallucinating. He has no insight into mental illness and is unreasonable.  Treatment plan. Continue Depakote, Haldol oral and injectable, and Clozapine titration.  Social/disposition. Discharge to his apartment. Follow up with ACT team.   Principal Problem: Undifferentiated schizophrenia ([place]) Diagnosis:   Patient Active Problem List   Diagnosis Date Noted  . Undifferentiated schizophrenia ([place]) [F20.3] 11/04/2016    Priority: High  . Noncompliance with treatment [Z91.19] 04/14/2015   Total Time spent with patient: 20 minutes  Past Psychiatric History: schizophrenia  Past Medical History:  Past Medical History:  Diagnosis Date  . Anxiety   . Generalized anxiety disorder   . Schizophrenia [facility])     Past Surgical History:  Procedure Laterality Date  . TONSILLECTOMY     Family History:  Family History  Family history unknown: Yes   Family Psychiatric  History: none reported Social History:  Social History   Substance and Sexual Activity  Alcohol Use No  . Alcohol/week: 0.0 oz     Social History   Substance and Sexual Activity  Drug Use No    Social History   Socioeconomic History  . Marital status: Single    Spouse name: None  . Number of children: None  . Years of education: None  . Highest education level: None  Social Needs  . Financial resource strain: None  . Food insecurity - worry: None  . Food insecurity - inability: None  . Transportation needs - medical: None  . Transportation needs - non-medical: None  Occupational History  . None  Tobacco Use  . Smoking status: Former Smoker    Types: Cigarettes    Last attempt to quit: 06/11/1999    Years since quitting: 18.1  . Smokeless tobacco: Never Used  Substance and Sexual Activity  . Alcohol use: No    Alcohol/week: 0.0 oz  . Drug use: No  . Sexual  activity: No  Other Topics Concern  . None  Social History Narrative  . None   Additional Social History:                         Sleep: Fair  Appetite:  Fair  Current Medications: Current Facility-Administered Medications  Medication Dose Route Frequency Provider Last Rate Last Dose  . acetaminophen (TYLENOL) tablet 650 mg  650 mg Oral Q6H PRN Clapacs, [name], MD   650 mg at 07/19/17 2305  . alum & mag hydroxide-simeth (MAALOX/MYLANTA) 200-200-20 MG/5ML suspension 30 mL  30 mL Oral Q4H PRN Clapacs, [name], MD      . amoxicillin (AMOXIL) capsule 500 mg  500 mg Oral Q8H Bristyl Mclees B, MD   500 mg at 07/22/17 0603  . cloZAPine (CLOZARIL) tablet 100 mg  100 mg Oral QHS Tonilynn Bieker B, MD   100 mg at 07/21/17 2139  . divalproex (DEPAKOTE) DR tablet 500 mg  500 mg Oral Q12H Clapacs, [name], MD   500 mg at 07/22/17 0811  . haloperidol (HALDOL) tablet 10 mg  10 mg Oral QHS Gal Feldhaus B, MD   10 mg at 07/21/17 2139  . haloperidol decanoate (HALDOL DECANOATE) 100 MG/ML injection 100 mg  100 mg Intramuscular Q28 days Renesmay Nesbitt B, MD   100 mg at 07/17/17 0851  . magnesium hydroxide (MILK OF MAGNESIA) suspension 30 mL  30 mL Oral Daily PRN Clapacs, [name], MD        Lab Results: No results found for this or any previous visit (from the past 48 hour(s)).  Blood Alcohol level:  Lab Results  Component Value Date   ETH <10 07/15/2017   ETH <10 13/24/4010    Metabolic Disorder Labs: Lab Results  Component Value Date   HGBA1C 5.4 06/19/2017   MPG 108.28 06/19/2017   MPG 111 11/05/2016   No results found for: PROLACTIN Lab Results  Component Value Date   CHOL 240 (H) 06/19/2017   TRIG 290 (H) 06/19/2017   HDL 36 (L) 06/19/2017   CHOLHDL 6.7 06/19/2017   VLDL 58 (H) 06/19/2017   LDLCALC 146 (H) 06/19/2017   LDLCALC 115 (H) 11/05/2016    Physical Findings: AIMS: Facial and Oral Movements Muscles of Facial Expression: None, normal Lips  and Perioral Area: None, normal Jaw: None, normal Tongue: None, normal,Extremity Movements Upper (arms, wrists, hands, fingers): None, normal Lower (legs, knees, ankles, toes): None, normal, Trunk Movements Neck, shoulders, hips: None, normal, Overall Severity Severity of abnormal movements (highest score from questions above): None, normal Incapacitation due to abnormal movements: None, normal Patient's awareness of abnormal movements (rate only patient's report): No Awareness, Dental Status Current problems with teeth and/or dentures?: Yes(Poor dentition) Does patient usually wear dentures?: No  CIWA:    COWS:     Musculoskeletal: Strength & Muscle Tone: within normal limits Gait & Station: normal Patient leans: N/A  Psychiatric Specialty Exam: Physical Exam  Nursing note and vitals reviewed. Psychiatric: His affect is blunt. His speech is rapid and/or pressured. He is actively hallucinating. Thought content is paranoid and delusional. Cognition and memory are normal. He expresses impulsivity.    Review of Systems  Neurological: Negative.   Psychiatric/Behavioral: Positive for hallucinations.  All other systems reviewed and are negative.   Blood pressure 110/81, pulse 94, temperature 98.1 F (36.7 C), temperature source Oral, resp. rate 16, height 5\' 6"  (1.676 m), weight 93.9 kg (207 lb), SpO2 96 %.Body mass index is 33.41 kg/m.  General Appearance: Disheveled  Eye Contact:  Good  Speech:  Clear and Coherent  Volume:  Increased  Mood:  Dysphoric  Affect:  Flat  Thought Process:  Disorganized, Irrelevant and Descriptions of Associations: Loose  Orientation:  Full (Time, Place, and Person)  Thought Content:  Delusions, Hallucinations: Auditory and Paranoid Ideation  Suicidal Thoughts:  No  Homicidal Thoughts:  No  Memory:  Immediate;   Fair Recent;   Fair Remote;   Fair  Judgement:  Poor  Insight:  Lacking  Psychomotor Activity:  Normal  Concentration:  Concentration:  Fair and Attention Span: Fair  Recall:  [employer] of Knowledge:  Fair  Language:  Fair  Akathisia:  No  Handed:  Right  AIMS (if indicated):     Assets:  Communication Skills Desire for Improvement Financial Resources/Insurance Housing Physical Health Resilience Social Support  ADL's:  Intact  Cognition:  WNL  Sleep:  Number of Hours: 7.45     Treatment Plan Summary: Daily contact with patient to assess and evaluate symptoms and progress in treatment and Medication management   Benjamin Braun is a 52 year old male with a history of schizophrenia admitted floridly psychotic in the context of medication noncompliance.  #Psychosis -discontinueZyprexa  -continueHaldol decanoate100 mgmonthly injections, next injection on 3/7 -continueDepakote500 mg BID -continueHaldol 10 mg nightly -continue Clozapine titration, 150 mg today   #Tooth abscess -Amoxicillin 500 mg TID  #  Metabolic syndrome monitoring -Lipid panel, TSH and HgbA1C were recently done -EKG, QTc 435  #Disposition -TBE -discharge to his apartment -followup with [facility] teamon outpatient commitment       Orson Slick, MD 07/22/2017, 1:35 PM

## 2017-07-22 NOTE — Plan of Care (Signed)
  Progressing Coping: Ability to cope will improve 07/22/2017 2208 - Progressing by Derek Mound, RN Self-Care: Ability to participate in self-care as condition permits will improve 07/22/2017 2208 - Progressing by Derek Mound, RN   Not Progressing Education: Will be free of psychotic symptoms 07/22/2017 2208 - Not Progressing by Derek Mound, RN Knowledge of the prescribed therapeutic regimen will improve 07/22/2017 2208 - Not Progressing by Derek Mound, RN Coping: Ability to verbalize feelings will improve 07/22/2017 2208 - Not Progressing by Derek Mound, RN

## 2017-07-22 NOTE — Plan of Care (Signed)
Attending  unit programing . Retreats to room  following group.  Limites time wearing his hearing aid . Continues  to talk to self . Limits verbalizations of feelings  . No anger outburst  Has the ability to verbalize positive feeling about self. Voice of less anxiety   Progressing Activity: Will verbalize the importance of balancing activity with adequate rest periods 07/22/2017 1621 - Progressing by Leodis Liverpool, RN Education: Will be free of psychotic symptoms 07/22/2017 1621 - Progressing by Leodis Liverpool, RN Knowledge of the prescribed therapeutic regimen will improve 07/22/2017 1621 - Progressing by Leodis Liverpool, RN Coping: Ability to cope will improve 07/22/2017 1621 - Progressing by Leodis Liverpool, RN Ability to verbalize feelings will improve 07/22/2017 1621 - Progressing by Leodis Liverpool, RN Nutritional: Ability to achieve adequate nutritional intake will improve 07/22/2017 1621 - Progressing by Leodis Liverpool, RN Safety: Ability to redirect hostility and anger into socially appropriate behaviors will improve 07/22/2017 1621 - Progressing by Leodis Liverpool, RN Ability to remain free from injury will improve 07/22/2017 1621 - Progressing by Leodis Liverpool, RN Self-Care: Ability to participate in self-care as condition permits will improve 07/22/2017 1621 - Progressing by Leodis Liverpool, RN Self-Concept: Ability to verbalize positive feelings about self will improve 07/22/2017 1621 - Progressing by Leodis Liverpool, RN Education: Ability to state activities that reduce stress will improve 07/22/2017 1621 - Progressing by Leodis Liverpool, RN Self-Concept: Level of anxiety will decrease 07/22/2017 1621 - Progressing by Leodis Liverpool, RN Ability to modify response to factors that promote anxiety will improve 07/22/2017 1621 - Progressing by Leodis Liverpool, RN Education: Knowledge of [place] Education information/materials will improve 07/22/2017 1621 -  Progressing by Leodis Liverpool, RN

## 2017-07-22 NOTE — Plan of Care (Signed)
Pt was very paranoid this afternoon. Pt believed that "someone was in his room making a weapon to kill him" upon going in the room with patient and showing him no one was there he decided to go to day room. Pt was fixated on another client in the facility stating "he was trying to hurt him and stick something in his ear". These were all delusional thinking. Pt is medication complaint. Pt remains on Q 15 mins safety rounds. Will cont to monitor pt.  Progressing Activity: Will verbalize the importance of balancing activity with adequate rest periods 07/22/2017 0237 - Progressing by Corinna Capra, RN Education: Will be free of psychotic symptoms 07/22/2017 0237 - Progressing by Corinna Capra, RN Knowledge of the prescribed therapeutic regimen will improve 07/22/2017 0237 - Progressing by Corinna Capra, RN Coping: Ability to cope will improve 07/22/2017 0237 - Progressing by Corinna Capra, RN Ability to verbalize feelings will improve 07/22/2017 0237 - Progressing by Corinna Capra, RN Nutritional: Ability to achieve adequate nutritional intake will improve 07/22/2017 0237 - Progressing by Corinna Capra, RN Safety: Ability to redirect hostility and anger into socially appropriate behaviors will improve 07/22/2017 0237 - Progressing by Corinna Capra, RN Ability to remain free from injury will improve 07/22/2017 0237 - Progressing by Corinna Capra, RN Self-Care: Ability to participate in self-care as condition permits will improve 07/22/2017 0237 - Progressing by Corinna Capra, RN Self-Concept: Ability to verbalize positive feelings about self will improve 07/22/2017 0237 - Progressing by Corinna Capra, RN Education: Ability to state activities that reduce stress will improve 07/22/2017 0237 - Progressing by Corinna Capra, RN Self-Concept: Level of anxiety will decrease 07/22/2017 0237 - Progressing by Corinna Capra, RN Ability to modify response to factors that promote  anxiety will improve 07/22/2017 0237 - Progressing by Corinna Capra, RN Education: Knowledge of [place] Education information/materials will improve 07/22/2017 0237 - Progressing by Corinna Capra, RN Coping: Level of anxiety will decrease 07/22/2017 0237 - Progressing by Corinna Capra, RN Pain Managment: General experience of comfort will improve 07/22/2017 0237 - Progressing by Corinna Capra, RN

## 2017-07-23 NOTE — [facility] Group Notes (Signed)
[facility] Group Notes:  (Nursing/MHT/Case Management/Adjunct)  Date:  07/23/2017  Time:  4:53 AM  Type of Therapy:  Psychoeducational Skills  Participation Level:  Did Not Attend    Summary of Progress/Problems:  Reece Agar 07/23/2017, 4:53 AM

## 2017-07-23 NOTE — Plan of Care (Signed)
  Progressing Activity: Will verbalize the importance of balancing activity with adequate rest periods 07/23/2017 2216 - Progressing by Derek Mound, RN Self-Care: Ability to participate in self-care as condition permits will improve 07/23/2017 2216 - Progressing by Derek Mound, RN Self-Concept: Level of anxiety will decrease 07/23/2017 2216 - Progressing by Derek Mound, RN Ability to modify response to factors that promote anxiety will improve 07/23/2017 2216 - Progressing by Derek Mound, RN

## 2017-07-23 NOTE — [facility] Group Notes (Signed)
  07/23/2017  Time: 1:00PM  Type of Therapy/Topic:  Group Therapy:  Emotion Regulation  Participation Level:  Did Not Attend   Description of Group:    The purpose of this group is to assist patients in learning to regulate negative emotions and experience positive emotions. Patients will be guided to discuss ways in which they have been vulnerable to their negative emotions. These vulnerabilities will be juxtaposed with experiences of positive emotions or situations, and patients will be challenged to use positive emotions to combat negative ones. Special emphasis will be placed on coping with negative emotions in conflict situations, and patients will process healthy conflict resolution skills.  Therapeutic Goals: 1. Patient will identify two positive emotions or experiences to reflect on in order to balance out negative emotions 2. Patient will label two or more emotions that they find the most difficult to experience 3. Patient will demonstrate positive conflict resolution skills through discussion and/or role plays  Summary of Patient Progress: Pt was invited to attend group but chose not to attend. CSW will continue to encourage pt to attend group throughout their admission.    Therapeutic Modalities:   Cognitive Behavioral Therapy Feelings Identification Dialectical Behavioral Therapy  Alden Hipp, MSW, LCSW 07/23/2017 1:59 PM

## 2017-07-23 NOTE — Plan of Care (Signed)
Patient not attending unit programing . Remains to talk to himself . Compliant with medication. Using coping skills . Patient limited on verbalizing feeling . No injures or safety concerns . Able to  participate  in  self care . No positive feeling  vent about self .  Progressing Activity: Will verbalize the importance of balancing activity with adequate rest periods 07/23/2017 1921 - Progressing by Leodis Liverpool, RN Education: Will be free of psychotic symptoms 07/23/2017 1921 - Progressing by Leodis Liverpool, RN Knowledge of the prescribed therapeutic regimen will improve 07/23/2017 1921 - Progressing by Leodis Liverpool, RN Coping: Ability to cope will improve 07/23/2017 1921 - Progressing by Leodis Liverpool, RN Ability to verbalize feelings will improve 07/23/2017 1921 - Progressing by Leodis Liverpool, RN Nutritional: Ability to achieve adequate nutritional intake will improve 07/23/2017 1921 - Progressing by Leodis Liverpool, RN Safety: Ability to redirect hostility and anger into socially appropriate behaviors will improve 07/23/2017 1921 - Progressing by Leodis Liverpool, RN Ability to remain free from injury will improve 07/23/2017 1921 - Progressing by Leodis Liverpool, RN Self-Care: Ability to participate in self-care as condition permits will improve 07/23/2017 1921 - Progressing by Leodis Liverpool, RN Self-Concept: Ability to verbalize positive feelings about self will improve 07/23/2017 1921 - Progressing by Leodis Liverpool, RN Education: Ability to state activities that reduce stress will improve 07/23/2017 1921 - Progressing by Leodis Liverpool, RN Self-Concept: Level of anxiety will decrease 07/23/2017 1921 - Progressing by Leodis Liverpool, RN Ability to modify response to factors that promote anxiety will improve 07/23/2017 1921 - Progressing by Leodis Liverpool, RN Education: Knowledge of [place] Education information/materials will improve 07/23/2017 1921 -  Progressing by Leodis Liverpool, RN

## 2017-07-23 NOTE — Progress Notes (Addendum)
Patient found asleep in bed upon my arrival. Neither visible nor social this evening. Woke patient for snack and medications. Cooperative with assessment. Reports he is dropping things and experiencing drowsiness throughout the day. Denies SI, HI, AVH, depression, and anxiety. Denies pain.  Compliant with HS medication and staff direction. Q 15 minute checks maintained. Will continue to monitor throughout the shift.  Patient slept 8 hours. No apparent distress. Will endorse care to oncoming shift.

## 2017-07-23 NOTE — Progress Notes (Signed)
[facility] MD Progress Note  07/23/2017 1:23 PM Jamiere Gulas  MRN:  [id number]  Subjective:   Mr. Shorey is paranoid, delusional and angry. He demands discharge as he feels his neighbors are robbing him and he needs to call the police. No somatic complaints.  Treatment plan. Continue Haldol oral and injectable and Depakote. Continue Clozapine titration.  Social disposition. Discharge to his apartment. Follow up with [facility] if better.  Principal Problem: Undifferentiated schizophrenia ([place]) Diagnosis:   Patient Active Problem List   Diagnosis Date Noted  . Undifferentiated schizophrenia ([place]) [F20.3] 11/04/2016    Priority: High  . Noncompliance with treatment [Z91.19] 04/14/2015   Total Time spent with patient: 20 minutes  Past Psychiatric History: schizophrenia  Past Medical History:  Past Medical History:  Diagnosis Date  . Anxiety   . Generalized anxiety disorder   . Schizophrenia [facility])     Past Surgical History:  Procedure Laterality Date  . TONSILLECTOMY     Family History:  Family History  Family history unknown: Yes   Family Psychiatric  History: none reported Social History:  Social History   Substance and Sexual Activity  Alcohol Use No  . Alcohol/week: 0.0 oz     Social History   Substance and Sexual Activity  Drug Use No    Social History   Socioeconomic History  . Marital status: Single    Spouse name: None  . Number of children: None  . Years of education: None  . Highest education level: None  Social Needs  . Financial resource strain: None  . Food insecurity - worry: None  . Food insecurity - inability: None  . Transportation needs - medical: None  . Transportation needs - non-medical: None  Occupational History  . None  Tobacco Use  . Smoking status: Former Smoker    Types: Cigarettes    Last attempt to quit: 06/11/1999    Years since quitting: 18.1  . Smokeless tobacco: Never Used  Substance and Sexual Activity  .  Alcohol use: No    Alcohol/week: 0.0 oz  . Drug use: No  . Sexual activity: No  Other Topics Concern  . None  Social History Narrative  . None   Additional Social History:                         Sleep: Fair  Appetite:  Fair  Current Medications: Current Facility-Administered Medications  Medication Dose Route Frequency Provider Last Rate Last Dose  . acetaminophen (TYLENOL) tablet 650 mg  650 mg Oral Q6H PRN Clapacs, [name], MD   650 mg at 07/22/17 2054  . alum & mag hydroxide-simeth (MAALOX/MYLANTA) 200-200-20 MG/5ML suspension 30 mL  30 mL Oral Q4H PRN Clapacs, [name], MD      . amoxicillin (AMOXIL) capsule 500 mg  500 mg Oral Q8H Katerina Zurn B, MD   500 mg at 07/23/17 0602  . cloZAPine (CLOZARIL) tablet 150 mg  150 mg Oral QHS Nolin Grell B, MD   150 mg at 07/22/17 2049  . divalproex (DEPAKOTE) DR tablet 500 mg  500 mg Oral Q12H Clapacs, [name], MD   500 mg at 07/23/17 6269  . haloperidol (HALDOL) tablet 10 mg  10 mg Oral QHS Kallen Delatorre B, MD   10 mg at 07/22/17 2049  . [START ON 08/07/2017] haloperidol decanoate (HALDOL DECANOATE) 100 MG/ML injection 100 mg  100 mg Intramuscular Q21 days Averianna Brugger B, MD      .  magnesium hydroxide (MILK OF MAGNESIA) suspension 30 mL  30 mL Oral Daily PRN Clapacs, [name], MD        Lab Results: No results found for this or any previous visit (from the past 48 hour(s)).  Blood Alcohol level:  Lab Results  Component Value Date   ETH <10 07/15/2017   ETH <10 85/88/5027    Metabolic Disorder Labs: Lab Results  Component Value Date   HGBA1C 5.4 06/19/2017   MPG 108.28 06/19/2017   MPG 111 11/05/2016   No results found for: PROLACTIN Lab Results  Component Value Date   CHOL 240 (H) 06/19/2017   TRIG 290 (H) 06/19/2017   HDL 36 (L) 06/19/2017   CHOLHDL 6.7 06/19/2017   VLDL 58 (H) 06/19/2017   LDLCALC 146 (H) 06/19/2017   LDLCALC 115 (H) 11/05/2016    Physical Findings: AIMS: Facial and  Oral Movements Muscles of Facial Expression: None, normal Lips and Perioral Area: None, normal Jaw: None, normal Tongue: None, normal,Extremity Movements Upper (arms, wrists, hands, fingers): None, normal Lower (legs, knees, ankles, toes): None, normal, Trunk Movements Neck, shoulders, hips: None, normal, Overall Severity Severity of abnormal movements (highest score from questions above): None, normal Incapacitation due to abnormal movements: None, normal Patient's awareness of abnormal movements (rate only patient's report): No Awareness, Dental Status Current problems with teeth and/or dentures?: Yes(Poor dentition) Does patient usually wear dentures?: No  CIWA:    COWS:     Musculoskeletal: Strength & Muscle Tone: within normal limits Gait & Station: normal Patient leans: N/A  Psychiatric Specialty Exam: Physical Exam  Nursing note and vitals reviewed. Psychiatric: His affect is angry. His speech is rapid and/or pressured. He is actively hallucinating. Thought content is paranoid and delusional. Cognition and memory are impaired. He expresses impulsivity.    Review of Systems  Neurological: Negative.   Psychiatric/Behavioral: Positive for hallucinations.  All other systems reviewed and are negative.   Blood pressure 107/68, pulse 96, temperature 98.1 F (36.7 C), temperature source Oral, resp. rate 20, height 5\' 6"  (1.676 m), weight 93.9 kg (207 lb), SpO2 96 %.Body mass index is 33.41 kg/m.  General Appearance: Casual  Eye Contact:  Good  Speech:  Pressured  Volume:  Increased  Mood:  Dysphoric and Irritable  Affect:  Congruent  Thought Process:  Goal Directed and Descriptions of Associations: Intact  Orientation:  Full (Time, Place, and Person)  Thought Content:  Delusions, Hallucinations: Auditory and Paranoid Ideation  Suicidal Thoughts:  No  Homicidal Thoughts:  No  Memory:  Immediate;   Fair Recent;   Fair Remote;   Fair  Judgement:  Poor  Insight:  Lacking   Psychomotor Activity:  Increased  Concentration:  Concentration: Fair and Attention Span: Fair  Recall:  [employer] of Knowledge:  Fair  Language:  Fair  Akathisia:  No  Handed:  Right  AIMS (if indicated):     Assets:  Communication Skills Desire for Improvement Financial Resources/Insurance Housing Physical Health Resilience Social Support  ADL's:  Intact  Cognition:  WNL  Sleep:  Number of Hours: 6.5     Treatment Plan Summary: Daily contact with patient to assess and evaluate symptoms and progress in treatment and Medication management   Mr. Kincaid is a 52 year old male with a history of schizophrenia admitted floridly psychotic in the context of medication noncompliance.  #Psychosis -discontinueZyprexa  -continueHaldol decanoate100 mgmonthly injections, next injection on 3/7 -continueDepakote500 mg BID -continueHaldol 10 mg nightly -continue Clozapine titration, 150 mg today   #  Toothabscess -Amoxicillin 016 mg TID  #Metabolic syndrome monitoring -Lipid panel, TSH and HgbA1C were recently done -EKG, QTc 435  #Disposition -TBE -discharge to his apartment -followup with [facility] teamon outpatient commitment     Orson Slick, MD 07/23/2017, 1:23 PM

## 2017-07-23 NOTE — Tx Team (Signed)
Interdisciplinary Treatment and Diagnostic Plan Update  07/23/2017 Time of Session: 10:40 AM Benjamin Braun MRN: [id number]  Principal Diagnosis: Undifferentiated schizophrenia [facility])  Secondary Diagnoses: Principal Problem:   Undifferentiated schizophrenia ([place]) Active Problems:   Noncompliance with treatment   Current Medications:  Current Facility-Administered Medications  Medication Dose Route Frequency Provider Last Rate Last Dose  . acetaminophen (TYLENOL) tablet 650 mg  650 mg Oral Q6H PRN Clapacs, [name], MD   650 mg at 07/22/17 2054  . alum & mag hydroxide-simeth (MAALOX/MYLANTA) 200-200-20 MG/5ML suspension 30 mL  30 mL Oral Q4H PRN Clapacs, [name], MD      . amoxicillin (AMOXIL) capsule 500 mg  500 mg Oral Q8H Pucilowska, Jolanta B, MD   500 mg at 07/23/17 1433  . cloZAPine (CLOZARIL) tablet 150 mg  150 mg Oral QHS Pucilowska, Jolanta B, MD   150 mg at 07/22/17 2049  . divalproex (DEPAKOTE) DR tablet 500 mg  500 mg Oral Q12H Clapacs, [name], MD   500 mg at 07/23/17 4270  . haloperidol (HALDOL) tablet 10 mg  10 mg Oral QHS Pucilowska, Jolanta B, MD   10 mg at 07/22/17 2049  . [START ON 08/07/2017] haloperidol decanoate (HALDOL DECANOATE) 100 MG/ML injection 100 mg  100 mg Intramuscular Q21 days Pucilowska, Jolanta B, MD      . magnesium hydroxide (MILK OF MAGNESIA) suspension 30 mL  30 mL Oral Daily PRN Clapacs, [name], MD       PTA Medications: Medications Prior to Admission  Medication Sig Dispense Refill Last Dose  . divalproex (DEPAKOTE) 500 MG DR tablet Take 1 tablet (500 mg total) by mouth every 12 (twelve) hours. 60 tablet 1 07/16/2017 at 1051  . haloperidol decanoate (HALDOL DECANOATE) 100 MG/ML injection Inject 1 mL (100 mg total) into the muscle every 30 (thirty) days. Next injection on 12/09/2016. 1 mL 1 07/16/2017 at Unknown per pt.  Marland Kitchen OLANZapine (ZYPREXA) 15 MG tablet Take 1 tablet (15 mg total) by mouth at bedtime. 30 tablet 1 07/16/2017 at Unknown per pt.    Patient  Stressors: Medication change or noncompliance  Patient Strengths: Ability for insight Average or above average intelligence General fund of knowledge Motivation for treatment/growth  Treatment Modalities: Medication Management, Group therapy, Case management,  1 to 1 session with clinician, Psychoeducation, Recreational therapy.   Physician Treatment Plan for Primary Diagnosis: Undifferentiated schizophrenia ([place]) Long Term Goal(s): Improvement in symptoms so as ready for discharge NA   Short Term Goals: Ability to identify changes in lifestyle to reduce recurrence of condition will improve Ability to verbalize feelings will improve Ability to disclose and discuss suicidal ideas Ability to demonstrate self-control will improve Ability to identify and develop effective coping behaviors will improve Ability to maintain clinical measurements within normal limits will improve Compliance with prescribed medications will improve Ability to identify triggers associated with substance abuse/mental health issues will improve NA  Medication Management: Evaluate patient's response, side effects, and tolerance of medication regimen.  Therapeutic Interventions: 1 to 1 sessions, Unit Group sessions and Medication administration.  Evaluation of Outcomes: Progressing  Physician Treatment Plan for Secondary Diagnosis: Principal Problem:   Undifferentiated schizophrenia ([place]) Active Problems:   Noncompliance with treatment  Long Term Goal(s): Improvement in symptoms so as ready for discharge NA   Short Term Goals: Ability to identify changes in lifestyle to reduce recurrence of condition will improve Ability to verbalize feelings will improve Ability to disclose and discuss suicidal ideas Ability to demonstrate self-control  will improve Ability to identify and develop effective coping behaviors will improve Ability to maintain clinical measurements within normal limits will  improve Compliance with prescribed medications will improve Ability to identify triggers associated with substance abuse/mental health issues will improve NA     Medication Management: Evaluate patient's response, side effects, and tolerance of medication regimen.  Therapeutic Interventions: 1 to 1 sessions, Unit Group sessions and Medication administration.  Evaluation of Outcomes: Progressing   RN Treatment Plan for Primary Diagnosis: Undifferentiated schizophrenia ([place]) Long Term Goal(s): Knowledge of disease and therapeutic regimen to maintain health will improve  Short Term Goals: Ability to demonstrate self-control, Ability to identify and develop effective coping behaviors will improve and Compliance with prescribed medications will improve  Medication Management: RN will administer medications as ordered by provider, will assess and evaluate patient's response and provide education to patient for prescribed medication. RN will report any adverse and/or side effects to prescribing provider.  Therapeutic Interventions: 1 on 1 counseling sessions, Psychoeducation, Medication administration, Evaluate responses to treatment, Monitor vital signs and CBGs as ordered, Perform/monitor CIWA, COWS, AIMS and Fall Risk screenings as ordered, Perform wound care treatments as ordered.  Evaluation of Outcomes: Progressing   LCSW Treatment Plan for Primary Diagnosis: Undifferentiated schizophrenia ([place]) Long Term Goal(s): Safe transition to appropriate next level of care at discharge, Engage patient in therapeutic group addressing interpersonal concerns.  Short Term Goals: Engage patient in aftercare planning with referrals and resources and Increase skills for wellness and recovery  Therapeutic Interventions: Assess for all discharge needs, 1 to 1 time with Social worker, Explore available resources and support systems, Assess for adequacy in community support network, Educate family and  significant other(s) on suicide prevention, Complete Psychosocial Assessment, Interpersonal group therapy.  Evaluation of Outcomes: Progressing   Progress in Treatment: Attending groups: No. Participating in groups: No. Taking medication as prescribed: Yes. Toleration medication: Yes. Family/Significant other contact made: No, will contact:  CSW will contact pt's brother, Benjamin Braun, per his request. Patient understands diagnosis: No. Discussing patient identified problems/goals with staff: Yes. Medical problems stabilized or resolved: Yes. Denies suicidal/homicidal ideation: Yes. Issues/concerns per patient self-inventory: No. Other: n/a  New problem(s) identified: No, Describe:  No new problems identified  New Short Term/Long Term Goal(s): Benjamin Braun was unable to clearly state a goal. He continues to struggle with a delusional thought pattern.  Discharge Plan or Barriers: Tentative discharge plan is for Amedeo to discharge back to his home with ACT services in place.  Reason for Continuation of Hospitalization: Delusions  Hallucinations Medication stabilization  Estimated Length of Stay: 5-7 days  Attendees: Patient: Benjamin Braun 07/23/2017 5:27 PM  Physician: Orson Slick, MD 07/23/2017 5:27 PM  Nursing: Polly Cobia, RN 07/23/2017 5:27 PM  RN Care Manager: 07/23/2017 5:27 PM  Social Worker: Dossie Arbour, [place] 07/23/2017 5:27 PM  Recreational Therapist:  07/23/2017 5:27 PM  Other:  07/23/2017 5:27 PM  Other:  07/23/2017 5:27 PM  Other: 07/23/2017 5:27 PM    Scribe for Treatment Team: August Saucer, LCSW 07/23/2017 5:27 PM

## 2017-07-24 MED ORDER — CLOZAPINE 100 MG PO TABS
200.0000 mg | ORAL_TABLET | Freq: Every day | ORAL | Status: DC
  Administered 2017-07-24: 200 mg via ORAL
  Filled 2017-07-24: qty 2

## 2017-07-24 NOTE — Progress Notes (Signed)
Received Benjamin Braun this am to wake him for breakfast, he was OOB in the milieu for his meals, he was compliant with his morning medications. Afterwards he returned to bed.  He was noted OOB in the milieu at intervals and for his meals. He denied all of the psychiatric symptoms.

## 2017-07-24 NOTE — [facility] Group Notes (Signed)
LCSW Group Therapy Note 07/24/2017 9:00 AM  Type of Therapy and Topic:  Group Therapy:  Setting Goals  Participation Level:  Did Not Attend  Description of Group: In this process group, patients discussed using strengths to work toward goals and address challenges.  Patients identified two positive things about themselves and one goal they were working on.  Patients were given the opportunity to share openly and support each other's plan for self-empowerment.  The group discussed the value of gratitude and were encouraged to have a daily reflection of positive characteristics or circumstances.  Patients were encouraged to identify a plan to utilize their strengths to work on current challenges and goals.  Therapeutic Goals 1. Patient will verbalize personal strengths/positive qualities and relate how these can assist with achieving desired personal goals 2. Patients will verbalize affirmation of peers plans for personal change and goal setting 3. Patients will explore the value of gratitude and positive focus as related to successful achievement of goals 4. Patients will verbalize a plan for regular reinforcement of personal positive qualities and circumstances.  Summary of Patient Progress:       Therapeutic Modalities Cognitive Behavioral Therapy Motivational Interviewing    Devona Konig, LCSW 07/24/2017 11:19 AM

## 2017-07-24 NOTE — Progress Notes (Signed)
[facility] MD Progress Note  07/24/2017 2:04 PM Benjamin Braun  MRN:  [id number]  Subjective:    Benjamin Braun is still very paranoid and turns angry when confronted about his accusations of neighbors and police. He wants to go home to find his glasses. When angry at me, he does not put his hearing aid on and conversation is very difficult. He denies any problems. He accepts medications and tolerates them well.  Treatment plan. Continue oral and injectable Haldol, Depakote and Clozapine taper.  Social/disposition. He has apartment and a chance to follow up with ACT team if stable.  Principal Problem: Undifferentiated schizophrenia ([place]) Diagnosis:   Patient Active Problem List   Diagnosis Date Noted  . 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 Noncompliance with treatment [Z91.19] 04/14/2015   Total Time spent with patient: 20 minutes  Past Psychiatric History: schizophrenia  Past Medical History:  Past Medical History:  Diagnosis Date  . Anxiety   . Generalized anxiety disorder   . Schizophrenia [facility])     Past Surgical History:  Procedure Laterality Date  . TONSILLECTOMY     Family History:  Family History  Family history unknown: Yes   Family Psychiatric  History: none reported Social History:  Social History   Substance and Sexual Activity  Alcohol Use No  . Alcohol/week: 0.0 oz     Social History   Substance and Sexual Activity  Drug Use No    Social History   Socioeconomic History  . Marital status: Single    Spouse name: None  . Number of children: None  . Years of education: None  . Highest education level: None  Social Needs  . Financial resource strain: None  . Food insecurity - worry: None  . Food insecurity - inability: None  . Transportation needs - medical: None  . Transportation needs - non-medical: None  Occupational History  . None  Tobacco Use  . Smoking status: Former Smoker    Types: Cigarettes   Last attempt to quit: 06/11/1999    Years since quitting: 18.1  . Smokeless tobacco: Never Used  Substance and Sexual Activity  . Alcohol use: No    Alcohol/week: 0.0 oz  . Drug use: No  . Sexual activity: No  Other Topics Concern  . None  Social History Narrative  . None   Additional Social History:                         Sleep: Fair  Appetite:  Fair  Current Medications: Current Facility-Administered Medications  Medication Dose Route Frequency Provider Last Rate Last Dose  . acetaminophen (TYLENOL) tablet 650 mg  650 mg Oral Q6H PRN Clapacs, [name], MD   650 mg at 07/22/17 2054  . alum & mag hydroxide-simeth (MAALOX/MYLANTA) 200-200-20 MG/5ML suspension 30 mL  30 mL Oral Q4H PRN Clapacs, [name], MD      . amoxicillin (AMOXIL) capsule 500 mg  500 mg Oral Q8H Jennife Zaucha B, MD   500 mg at 07/24/17 0609  . cloZAPine (CLOZARIL) tablet 200 mg  200 mg Oral QHS Cylinda Santoli B, MD      . divalproex (DEPAKOTE) DR tablet 500 mg  500 mg Oral Q12H Clapacs, [name], MD   500 mg at 07/24/17 0807  . haloperidol (HALDOL) tablet 10 mg  10 mg Oral QHS Lavaughn Bisig B, MD   10 mg at 07/23/17 2128  . [START  ON 08/07/2017] haloperidol decanoate (HALDOL DECANOATE) 100 MG/ML injection 100 mg  100 mg Intramuscular Q21 days Deliyah Muckle B, MD      . magnesium hydroxide (MILK OF MAGNESIA) suspension 30 mL  30 mL Oral Daily PRN Clapacs, [name], MD        Lab Results: No results found for this or any previous visit (from the past 48 hour(s)).  Blood Alcohol level:  Lab Results  Component Value Date   ETH <10 07/15/2017   ETH <10 24/40/1027    Metabolic Disorder Labs: Lab Results  Component Value Date   HGBA1C 5.4 06/19/2017   MPG 108.28 06/19/2017   MPG 111 11/05/2016   No results found for: PROLACTIN Lab Results  Component Value Date   CHOL 240 (H) 06/19/2017   TRIG 290 (H) 06/19/2017   HDL 36 (L) 06/19/2017   CHOLHDL 6.7 06/19/2017   VLDL 58 (H)  06/19/2017   LDLCALC 146 (H) 06/19/2017   LDLCALC 115 (H) 11/05/2016    Physical Findings: AIMS: Facial and Oral Movements Muscles of Facial Expression: None, normal Lips and Perioral Area: None, normal Jaw: None, normal Tongue: None, normal,Extremity Movements Upper (arms, wrists, hands, fingers): None, normal Lower (legs, knees, ankles, toes): None, normal, Trunk Movements Neck, shoulders, hips: None, normal, Overall Severity Severity of abnormal movements (highest score from questions above): None, normal Incapacitation due to abnormal movements: None, normal Patient's awareness of abnormal movements (rate only patient's report): No Awareness, Dental Status Current problems with teeth and/or dentures?: Yes(Poor dentition) Does patient usually wear dentures?: No  CIWA:    COWS:     Musculoskeletal: Strength & Muscle Tone: within normal limits Gait & Station: normal Patient leans: N/A  Psychiatric Specialty Exam: Physical Exam  Nursing note and vitals reviewed. Psychiatric: His affect is blunt. His speech is delayed. He is slowed, withdrawn and actively hallucinating. Thought content is paranoid and delusional. Cognition and memory are impaired. He expresses impulsivity.    Review of Systems  Neurological: Negative.   Psychiatric/Behavioral: Positive for hallucinations.  All other systems reviewed and are negative.   Blood pressure 112/71, pulse 98, temperature 98.1 F (36.7 C), temperature source Oral, resp. rate 18, height 5\' 6"  (1.676 m), weight 93.9 kg (207 lb), SpO2 96 %.Body mass index is 33.41 kg/m.  General Appearance: Casual  Eye Contact:  Good  Speech:  Pressured  Volume:  Increased  Mood:  Angry and Irritable  Affect:  Congruent  Thought Process:  Disorganized and Descriptions of Associations: Loose  Orientation:  Full (Time, Place, and Person)  Thought Content:  Delusions, Hallucinations: Auditory and Paranoid Ideation  Suicidal Thoughts:  No  Homicidal  Thoughts:  No  Memory:  Immediate;   Fair Recent;   Fair Remote;   Fair  Judgement:  Poor  Insight:  Lacking  Psychomotor Activity:  Normal  Concentration:  Concentration: Fair and Attention Span: Fair  Recall:  [employer] of Knowledge:  Fair  Language:  Fair  Akathisia:  No  Handed:  Right  AIMS (if indicated):     Assets:  Communication Skills Desire for Improvement Financial Resources/Insurance Housing Physical Health Resilience Social Support  ADL's:  Intact  Cognition:  WNL  Sleep:  Number of Hours: 8     Treatment Plan Summary: Daily contact with patient to assess and evaluate symptoms and progress in treatment and Medication management   Mr. Chiarelli is a 52 year old male with a history of schizophrenia admitted floridly psychotic in the context of  medication noncompliance.  #Psychosis -discontinueZyprexa  -continueHaldol decanoate100 mgmonthly injections, next injection on 3/7 -continueDepakote500 mg BID -continueHaldol 10 mg nightly -continue Clozapine titration, 200 mg today  #Toothabscess -Amoxicillin 245 mg TID  #Metabolic syndrome monitoring -Lipid panel, TSH and HgbA1C were recently done -EKG, QTc 435  #Disposition -TBE -discharge to his apartment -followup with [facility] teamon outpatient commitment    Orson Slick, MD 07/24/2017, 2:04 PM

## 2017-07-24 NOTE — Progress Notes (Signed)
Patient ID: Benjamin Braun, male   DOB: [birth year], 52 y.o.   MRN: [id number] CSW attempted to contact pt's brother, Juancarlos Crescenzo at [phone], in an attempt to complete suicide prevention education.  A male answered by phone (would not give her name) and stated that this was no longer [name] number and preceded to hang up the telephone.

## 2017-07-24 NOTE — [facility] Group Notes (Signed)
[place] Group Notes:  (Nursing/MHT/Case Management/Adjunct)  Date:  07/24/2017  Time:  10:35 PM  Type of Therapy:  Group Therapy  Participation Level:  Active  Participation Quality:  Appropriate  Affect:  Appropriate  Cognitive:  Alert  Insight:  Good  Engagement in Group:  Engaged  Modes of Intervention:  Support  Summary of Progress/Problems:  Benjamin Braun 07/24/2017, 10:35 PM

## 2017-07-24 NOTE — [facility] Group Notes (Signed)
07/24/2017 1PM  Type of Therapy/Topic:  Group Therapy:  Balance in Life  Participation Level:  Did Not Attend  Description of Group:   This group will address the concept of balance and how it feels and looks when one is unbalanced. Patients will be encouraged to process areas in their lives that are out of balance and identify reasons for remaining unbalanced. Facilitators will guide patients in utilizing problem-solving interventions to address and correct the stressor making their life unbalanced. Understanding and applying boundaries will be explored and addressed for obtaining and maintaining a balanced life. Patients will be encouraged to explore ways to assertively make their unbalanced needs known to significant others in their lives, using other group members and facilitator for support and feedback.  Therapeutic Goals: 1. Patient will identify two or more emotions or situations they have that consume much of in their lives. 2. Patient will identify signs/triggers that life has become out of balance:  3. Patient will identify two ways to set boundaries in order to achieve balance in their lives:  4. Patient will demonstrate ability to communicate their needs through discussion and/or role plays  Summary of Patient Progress: Patient was encouraged and invited to attend group. Patient did not attend group. Social worker will continue to encourage group participation in the future.         Therapeutic Modalities:   Cognitive Behavioral Therapy Solution-Focused Therapy Assertiveness Training  Darin Engels, [place]

## 2017-07-25 MED ORDER — CLOZAPINE 100 MG PO TABS
200.0000 mg | ORAL_TABLET | Freq: Every day | ORAL | Status: AC
  Administered 2017-07-25: 200 mg via ORAL
  Filled 2017-07-25: qty 2

## 2017-07-25 MED ORDER — CLOZAPINE 25 MG PO TABS
250.0000 mg | ORAL_TABLET | Freq: Every day | ORAL | Status: DC
  Administered 2017-07-25 – 2017-07-26 (×2): 250 mg via ORAL
  Filled 2017-07-25 (×3): qty 2

## 2017-07-25 NOTE — Progress Notes (Signed)
[facility] MD Progress Note  07/25/2017 11:40 AM Benjamin Braun  MRN:  [id number]  Subjective:   Benjamin Braun is still paranoid, delusional and hallucinating. He is angry with me for keeping him in the hospital. This is his second admission in a month. He was unable to establish care with any providers including ACT team. He seems to tolerated Clozapine well but his progress is very slow.   Treatment plan. He is on oral and injectable Haldol and depakote. Started on Clozapine titration getting 250 mg on Sat and Sun.  Social/disposition. Discharge to his apartment. Will not allow ACT team.  Principal Problem: Undifferentiated schizophrenia ([place]) Diagnosis:   Patient Active Problem List   Diagnosis Date Noted  . Undifferentiated schizophrenia ([place]) [F20.3] 11/04/2016    Priority: High  . Noncompliance with treatment [Z91.19] 04/14/2015   Total Time spent with patient: 20 minutes  Past Psychiatric History: schizophrenia.  Past Medical History:  Past Medical History:  Diagnosis Date  . Anxiety   . Generalized anxiety disorder   . Schizophrenia [facility])     Past Surgical History:  Procedure Laterality Date  . TONSILLECTOMY     Family History:  Family History  Family history unknown: Yes   Family Psychiatric  History: none reported Social History:  Social History   Substance and Sexual Activity  Alcohol Use No  . Alcohol/week: 0.0 oz     Social History   Substance and Sexual Activity  Drug Use No    Social History   Socioeconomic History  . Marital status: Single    Spouse name: None  . Number of children: None  . Years of education: None  . Highest education level: None  Social Needs  . Financial resource strain: None  . Food insecurity - worry: None  . Food insecurity - inability: None  . Transportation needs - medical: None  . Transportation needs - non-medical: None  Occupational History  . None  Tobacco Use  . Smoking status: Former Smoker    Types:  Cigarettes    Last attempt to quit: 06/11/1999    Years since quitting: 18.1  . Smokeless tobacco: Never Used  Substance and Sexual Activity  . Alcohol use: No    Alcohol/week: 0.0 oz  . Drug use: No  . Sexual activity: No  Other Topics Concern  . None  Social History Narrative  . None   Additional Social History:                         Sleep: Fair  Appetite:  Fair  Current Medications: Current Facility-Administered Medications  Medication Dose Route Frequency Provider Last Rate Last Dose  . acetaminophen (TYLENOL) tablet 650 mg  650 mg Oral Q6H PRN Clapacs, [name], MD   650 mg at 07/22/17 2054  . alum & mag hydroxide-simeth (MAALOX/MYLANTA) 200-200-20 MG/5ML suspension 30 mL  30 mL Oral Q4H PRN Clapacs, [name], MD      . amoxicillin (AMOXIL) capsule 500 mg  500 mg Oral Q8H Jazziel Fitzsimmons B, MD   500 mg at 07/25/17 0625  . cloZAPine (CLOZARIL) tablet 200 mg  200 mg Oral QHS Allyce Bochicchio B, MD      . cloZAPine (CLOZARIL) tablet 250 mg  250 mg Oral QHS Stockton Nunley B, MD      . divalproex (DEPAKOTE) DR tablet 500 mg  500 mg Oral Q12H Clapacs, [name], MD   500 mg at 07/25/17 2751  .  haloperidol (HALDOL) tablet 10 mg  10 mg Oral QHS Jaylin Benzel B, MD   10 mg at 07/24/17 2115  . [START ON 08/07/2017] haloperidol decanoate (HALDOL DECANOATE) 100 MG/ML injection 100 mg  100 mg Intramuscular Q21 days Philomina Leon B, MD      . magnesium hydroxide (MILK OF MAGNESIA) suspension 30 mL  30 mL Oral Daily PRN Clapacs, [name], MD        Lab Results: No results found for this or any previous visit (from the past 48 hour(s)).  Blood Alcohol level:  Lab Results  Component Value Date   ETH <10 07/15/2017   ETH <10 16/03/9603    Metabolic Disorder Labs: Lab Results  Component Value Date   HGBA1C 5.4 06/19/2017   MPG 108.28 06/19/2017   MPG 111 11/05/2016   No results found for: PROLACTIN Lab Results  Component Value Date   CHOL 240 (H)  06/19/2017   TRIG 290 (H) 06/19/2017   HDL 36 (L) 06/19/2017   CHOLHDL 6.7 06/19/2017   VLDL 58 (H) 06/19/2017   LDLCALC 146 (H) 06/19/2017   LDLCALC 115 (H) 11/05/2016    Physical Findings: AIMS: Facial and Oral Movements Muscles of Facial Expression: None, normal Lips and Perioral Area: None, normal Jaw: None, normal Tongue: None, normal,Extremity Movements Upper (arms, wrists, hands, fingers): None, normal Lower (legs, knees, ankles, toes): None, normal, Trunk Movements Neck, shoulders, hips: None, normal, Overall Severity Severity of abnormal movements (highest score from questions above): None, normal Incapacitation due to abnormal movements: None, normal Patient's awareness of abnormal movements (rate only patient's report): No Awareness, Dental Status Current problems with teeth and/or dentures?: Yes(Poor dentition) Does patient usually wear dentures?: No  CIWA:    COWS:     Musculoskeletal: Strength & Muscle Tone: within normal limits Gait & Station: normal Patient leans: N/A  Psychiatric Specialty Exam: Physical Exam  Nursing note and vitals reviewed. Psychiatric: His affect is angry and inappropriate. His speech is rapid and/or pressured. He is hyperactive. Thought content is paranoid and delusional. Cognition and memory are impaired. He expresses impulsivity.    Review of Systems  Neurological: Negative.   Psychiatric/Behavioral: Positive for hallucinations.  All other systems reviewed and are negative.   Blood pressure 105/75, pulse 96, temperature 97.9 F (36.6 C), temperature source Oral, resp. rate 18, height 5\' 6"  (1.676 m), weight 93.9 kg (207 lb), SpO2 97 %.Body mass index is 33.41 kg/m.  General Appearance: Casual  Eye Contact:  Good  Speech:  Pressured and Slurred  Volume:  Increased  Mood:  Angry  Affect:  Congruent  Thought Process:  Irrelevant  Orientation:  Full (Time, Place, and Person)  Thought Content:  Delusions, Hallucinations: Auditory  and Paranoid Ideation  Suicidal Thoughts:  No  Homicidal Thoughts:  No  Memory:  Immediate;   Fair Recent;   Fair Remote;   Fair  Judgement:  Poor  Insight:  Lacking  Psychomotor Activity:  Normal  Concentration:  Concentration: Fair and Attention Span: Fair  Recall:  [employer] of Knowledge:  Fair  Language:  Fair  Akathisia:  No  Handed:  Right  AIMS (if indicated):     Assets:  Communication Skills Desire for Improvement Financial Resources/Insurance Housing Physical Health Resilience Social Support  ADL's:  Intact  Cognition:  WNL  Sleep:  Number of Hours: 8     Treatment Plan Summary: Daily contact with patient to assess and evaluate symptoms and progress in treatment and Medication management  Mr. Blanton is a 52 year old male with a history of schizophrenia admitted floridly psychotic in the context of medication noncompliance.  #Psychosis -discontinueZyprexa  -continueHaldol decanoate100 mgmonthly injections, next injection on 3/7 -continueDepakote500 mg BID -continueHaldol 10 mg nightly -continue Clozapine titration, 200 mg today, 250 mg on the weekend  #Toothabscess -Amoxicillin 759 mg TID  #Metabolic syndrome monitoring -Lipid panel, TSH and HgbA1C were recently done -EKG, QTc 435  #Disposition -TBE -discharge to his apartment -followup with [facility] teamon outpatient commitment    Orson Slick, MD 07/25/2017, 11:40 AM

## 2017-07-25 NOTE — B[facility] Group Notes (Signed)
LCSW Group Therapy Note  07/25/2017 1:00 PM  Type of Therapy and Topic:  Group Therapy:  Feelings around Relapse and Recovery  Participation Level:  Did Not Attend   Description of Group:    Patients in this group will discuss emotions they experience before and after a relapse. They will process how experiencing these feelings, or avoidance of experiencing them, relates to having a relapse. Facilitator will guide patients to explore emotions they have related to recovery. Patients will be encouraged to process which emotions are more powerful. They will be guided to discuss the emotional reaction significant others in their lives may have to their relapse or recovery. Patients will be assisted in exploring ways to respond to the emotions of others without this contributing to a relapse.  Therapeutic Goals: 1. Patient will identify two or more emotions that lead to a relapse for them 2. Patient will identify two emotions that result when they relapse 3. Patient will identify two emotions related to recovery 4. Patient will demonstrate ability to communicate their needs through discussion and/or role plays   Summary of Patient Progress:     Therapeutic Modalities:   Cognitive Behavioral Therapy Solution-Focused Therapy Assertiveness Training Relapse Prevention Therapy   Devona Konig, LCSW 07/25/2017 2:06 PM

## 2017-07-25 NOTE — Plan of Care (Signed)
Patient is alert to and oriented to self, situation and place. Patient denies SI, HI and AVH but continues to be paranoid about his neighbors hurting his family. Patient does not attend groups; isolates to his room most of the time; patient comes outt

## 2017-07-25 NOTE — Progress Notes (Signed)

## 2017-07-26 LAB — CBC WITH DIFFERENTIAL/PLATELET
Basophils Absolute: 0.1 10*3/uL (ref 0–0.1)
Basophils Relative: 1 %
EOS PCT: 1 %
Eosinophils Absolute: 0.1 10*3/uL (ref 0–0.7)
HCT: 44.5 % (ref 40.0–52.0)
Hemoglobin: 14.9 g/dL (ref 13.0–18.0)
LYMPHS ABS: 2 10*3/uL (ref 1.0–3.6)
LYMPHS PCT: 28 %
MCH: 28.8 pg (ref 26.0–34.0)
MCHC: 33.6 g/dL (ref 32.0–36.0)
MCV: 85.6 fL (ref 80.0–100.0)
Monocytes Absolute: 0.6 10*3/uL (ref 0.2–1.0)
Monocytes Relative: 8 %
Neutro Abs: 4.6 10*3/uL (ref 1.4–6.5)
Neutrophils Relative %: 62 %
PLATELETS: 229 10*3/uL (ref 150–440)
RBC: 5.2 MIL/uL (ref 4.40–5.90)
RDW: 13.1 % (ref 11.5–14.5)
WBC: 7.4 10*3/uL (ref 3.8–10.6)

## 2017-07-26 NOTE — Progress Notes (Signed)
D: Patient denies SI/HI/AVH. Patient verbally contracts for safety. Patient is calm, cooperative and pleasant. Patient remains in room this shift. Patient has complaint of toe pain.  A: Patient was assessed by this nurse. Patient was oriented to unit. Patient's safety was maintained on unit. Q x 15 minute observation checks were completed for safety. Patient care plan was reviewed. Patient was offered support and encouragement. Patient was encourage to attend groups, participate in unit activities and continue with plan of care.   R: Patient has no complaints of pain at this time. Patient is receptive to treatment and safety maintained on unit.

## 2017-07-26 NOTE — Plan of Care (Signed)
Pt slept until night time medication. Pt was awaken and stated,"I think my medication is to strong". Pt could not elaborate more on the subject. Pt offer snack. Pt still isolated to self. Pt remains on Q 15 mins safety rounds. Will cont to monitor pt.  Progressing Activity: Will verbalize the importance of balancing activity with adequate rest periods 07/26/2017 2305 - Progressing by Corinna Capra, RN Education: Will be free of psychotic symptoms 07/26/2017 2305 - Progressing by Corinna Capra, RN Knowledge of the prescribed therapeutic regimen will improve 07/26/2017 2305 - Progressing by Corinna Capra, RN Coping: Ability to cope will improve 07/26/2017 2305 - Progressing by Corinna Capra, RN Ability to verbalize feelings will improve 07/26/2017 2305 - Progressing by Corinna Capra, RN Nutritional: Ability to achieve adequate nutritional intake will improve 07/26/2017 2305 - Progressing by Corinna Capra, RN Safety: Ability to redirect hostility and anger into socially appropriate behaviors will improve 07/26/2017 2305 - Progressing by Corinna Capra, RN Ability to remain free from injury will improve 07/26/2017 2305 - Progressing by Corinna Capra, RN Self-Care: Ability to participate in self-care as condition permits will improve 07/26/2017 2305 - Progressing by Corinna Capra, RN Self-Concept: Ability to verbalize positive feelings about self will improve 07/26/2017 2305 - Progressing by Corinna Capra, RN Education: Ability to state activities that reduce stress will improve 07/26/2017 2305 - Progressing by Corinna Capra, RN Self-Concept: Level of anxiety will decrease 07/26/2017 2305 - Progressing by Corinna Capra, RN Ability to modify response to factors that promote anxiety will improve 07/26/2017 2305 - Progressing by Corinna Capra, RN Education: Knowledge of [place] Education information/materials will improve 07/26/2017 2305 - Progressing by Corinna Capra, RN

## 2017-07-26 NOTE — Consult Note (Signed)
MEDICATION RELATED CONSULT NOTE - INITIAL   Pharmacy Consult for Clozapine labs monitoring and REMs program reporting   Patient Measurements: Height: 5\' 6"  (167.6 cm) Weight: 207 lb (93.9 kg) IBW/kg (Calculated) : 63.8  Vital Signs: Temp: 98 F (36.7 C) (02/16 0726) Temp Source: Oral (02/16 0726) BP: 115/78 (02/16 0726) Pulse Rate: 103 (02/16 0726) Intake/Output from previous day: 02/15 0701 - 02/16 0700 In: 960 [P.O.:960] Out: -  Intake/Output from this shift: No intake/output data recorded.  Labs: Recent Labs    07/26/17 1155  WBC 7.4  HGB 14.9  HCT 44.5  PLT 229   Estimated Creatinine Clearance: 86.4 mL/min (by C-G formula based on SCr of 1.06 mg/dL).  Medical History: Past Medical History:  Diagnosis Date  . Anxiety   . Generalized anxiety disorder   . Schizophrenia [facility])     Assessment: Pharmacy consulted for clozapine lab monitoring and REMS Reporting in 52 yo male with PMH of schizophrenia.   Plan:  [facility] 4200. Will recheck cbc w diff on 2/22.   Larene Beach, PharmD, BCPS Clinical Pharmacist 07/26/2017 1:58 PM

## 2017-07-26 NOTE — [facility] Group Notes (Signed)
LCSW Group Therapy Note  07/26/2017 1:15pm  Type of Therapy and Topic:  Group Therapy:  Fears and Unhealthy Coping Skills  Participation Level:  Did Not Attend   Description of Group:  The focus of this group was to discuss some of the prevalent fears that patients experience, and to identify the commonalities among group members.  An exercise was used to initiate the discussion, followed by writing on the white board a group-generated list of unhealthy coping and healthy coping techniques to deal with each fear.    Therapeutic Goals: 1. Patient will identify and describe 3 fears they experience 2. Patient will identify one positive coping strategy for each fear they experience 3. Patient will respond empathically to peers statements regarding fears they experience  Summary of Patient Progress:      Therapeutic Modalities Cognitive Behavioral Therapy Motivational Interviewing  [place], LCSW 07/26/2017 11:41 AM

## 2017-07-26 NOTE — [facility] Group Notes (Signed)
[place] Group Notes:  (Nursing/MHT/Case Management/Adjunct)  Date:  07/26/2017  Time:  6:19 AM  Type of Therapy:  Psychoeducational Skills  Participation Level:  Did Not Attend  Summary of Progress/Problems:  Reece Agar 07/26/2017, 6:19 AM

## 2017-07-26 NOTE — Plan of Care (Signed)
  Progressing Activity: Will verbalize the importance of balancing activity with adequate rest periods 07/26/2017 1112 - Progressing by Jacinto Halim, RN Education: Will be free of psychotic symptoms 07/26/2017 1112 - Progressing by Jacinto Halim, RN Knowledge of the prescribed therapeutic regimen will improve 07/26/2017 1112 - Progressing by Jacinto Halim, RN Coping: Ability to cope will improve 07/26/2017 1112 - Progressing by Jacinto Halim, RN Ability to verbalize feelings will improve 07/26/2017 1112 - Progressing by Jacinto Halim, RN Nutritional: Ability to achieve adequate nutritional intake will improve 07/26/2017 1112 - Progressing by Jacinto Halim, RN Safety: Ability to redirect hostility and anger into socially appropriate behaviors will improve 07/26/2017 1112 - Progressing by Jacinto Halim, RN Ability to remain free from injury will improve 07/26/2017 1112 - Progressing by Jacinto Halim, RN Self-Care: Ability to participate in self-care as condition permits will improve 07/26/2017 1112 - Progressing by Jacinto Halim, RN Self-Concept: Ability to verbalize positive feelings about self will improve 07/26/2017 1112 - Progressing by Jacinto Halim, RN Education: Ability to state activities that reduce stress will improve 07/26/2017 1112 - Progressing by Jacinto Halim, RN Self-Concept: Level of anxiety will decrease 07/26/2017 1112 - Progressing by Jacinto Halim, RN Ability to modify response to factors that promote anxiety will improve 07/26/2017 1112 - Progressing by Jacinto Halim, RN Education: Knowledge of [place] Education information/materials will improve 07/26/2017 1112 - Progressing by Jacinto Halim, RN

## 2017-07-26 NOTE — Progress Notes (Signed)
Patient denies SI/HI/AVH. Patient verbally contracts for safety. Patient is calm, cooperative and pleasant. Patient remains in room for the majority of this shift. Patient complained of toe pain. Compliant with medications and meals. Milieu remains safe with q 15 minute safety checks.

## 2017-07-26 NOTE — Progress Notes (Signed)
[facility] MD Progress Note  07/26/2017 2:48 PM Benjamin Braun  MRN:  [id number] Subjective: Follow-up for 52 year old man with schizophrenia.  Patient has no new complaints today.  Mostly stays withdrawn into himself.  Disorganized speech and appears paranoid.  Not acting out violently however.  Tolerating medicine well. Principal Problem: Undifferentiated schizophrenia ([place]) Diagnosis:   Patient Active Problem List   Diagnosis Date Noted  . Undifferentiated schizophrenia ([place]) [F20.3] 11/04/2016  . Noncompliance with treatment [Z91.19] 04/14/2015   Total Time spent with patient: 20 minutes  Past Psychiatric History: Patient has a long history of documented schizophrenia with multiple hospitalizations because of his paranoid behavior and noncompliance  Past Medical History:  Past Medical History:  Diagnosis Date  . Anxiety   . Generalized anxiety disorder   . Schizophrenia [facility])     Past Surgical History:  Procedure Laterality Date  . TONSILLECTOMY     Family History:  Family History  Family history unknown: Yes   Family Psychiatric  History: See previous note Social History:  Social History   Substance and Sexual Activity  Alcohol Use No  . Alcohol/week: 0.0 oz     Social History   Substance and Sexual Activity  Drug Use No    Social History   Socioeconomic History  . Marital status: Single    Spouse name: None  . Number of children: None  . Years of education: None  . Highest education level: None  Social Needs  . Financial resource strain: None  . Food insecurity - worry: None  . Food insecurity - inability: None  . Transportation needs - medical: None  . Transportation needs - non-medical: None  Occupational History  . None  Tobacco Use  . Smoking status: Former Smoker    Types: Cigarettes    Last attempt to quit: 06/11/1999    Years since quitting: 18.1  . Smokeless tobacco: Never Used  Substance and Sexual Activity  . Alcohol use: No   Alcohol/week: 0.0 oz  . Drug use: No  . Sexual activity: No  Other Topics Concern  . None  Social History Narrative  . None   Additional Social History:                         Sleep: Fair  Appetite:  Fair  Current Medications: Current Facility-Administered Medications  Medication Dose Route Frequency Provider Last Rate Last Dose  . acetaminophen (TYLENOL) tablet 650 mg  650 mg Oral Q6H PRN Abdinasir Spadafore, [name], MD   650 mg at 07/25/17 2135  . alum & mag hydroxide-simeth (MAALOX/MYLANTA) 200-200-20 MG/5ML suspension 30 mL  30 mL Oral Q4H PRN Ima Hafner T, MD      . amoxicillin (AMOXIL) capsule 500 mg  500 mg Oral Q8H Pucilowska, Jolanta B, MD   500 mg at 07/26/17 1446  . cloZAPine (CLOZARIL) tablet 250 mg  250 mg Oral QHS Pucilowska, Jolanta B, MD   250 mg at 07/25/17 2146  . divalproex (DEPAKOTE) DR tablet 500 mg  500 mg Oral Q12H Jacobie Stamey T, MD   500 mg at 07/26/17 0843  . haloperidol (HALDOL) tablet 10 mg  10 mg Oral QHS Pucilowska, Jolanta B, MD   10 mg at 07/25/17 2135  . [START ON 08/07/2017] haloperidol decanoate (HALDOL DECANOATE) 100 MG/ML injection 100 mg  100 mg Intramuscular Q21 days Pucilowska, Jolanta B, MD      . magnesium hydroxide (MILK OF MAGNESIA) suspension 30 mL  30  mL Oral Daily PRN Gennie Dib, [name], MD        Lab Results:  Results for orders placed or performed during the hospital encounter of 07/16/17 (from the past 48 hour(s))  CBC with Differential/Platelet     Status: None   Collection Time: 07/26/17 11:55 AM  Result Value Ref Range   WBC 7.4 3.8 - 10.6 K/uL   RBC 5.20 4.40 - 5.90 MIL/uL   Hemoglobin 14.9 13.0 - 18.0 g/dL   HCT 44.5 40.0 - 52.0 %   MCV 85.6 80.0 - 100.0 fL   MCH 28.8 26.0 - 34.0 pg   MCHC 33.6 32.0 - 36.0 g/dL   RDW 13.1 11.5 - 14.5 %   Platelets 229 150 - 440 K/uL   Neutrophils Relative % 62 %   Neutro Abs 4.6 1.4 - 6.5 K/uL   Lymphocytes Relative 28 %   Lymphs Abs 2.0 1.0 - 3.6 K/uL   Monocytes Relative 8 %    Monocytes Absolute 0.6 0.2 - 1.0 K/uL   Eosinophils Relative 1 %   Eosinophils Absolute 0.1 0 - 0.7 K/uL   Basophils Relative 1 %   Basophils Absolute 0.1 0 - 0.1 K/uL    Comment: Performed at [facility], [address] [place], [place] [address]    Blood Alcohol level:  Lab Results  Component Value Date   [facility] <10 07/15/2017   ETH <10 81/82/9937    Metabolic Disorder Labs: Lab Results  Component Value Date   HGBA1C 5.4 06/19/2017   MPG 108.28 06/19/2017   MPG 111 11/05/2016   No results found for: PROLACTIN Lab Results  Component Value Date   CHOL 240 (H) 06/19/2017   TRIG 290 (H) 06/19/2017   HDL 36 (L) 06/19/2017   CHOLHDL 6.7 06/19/2017   VLDL 58 (H) 06/19/2017   LDLCALC 146 (H) 06/19/2017   LDLCALC 115 (H) 11/05/2016    Physical Findings: AIMS: Facial and Oral Movements Muscles of Facial Expression: None, normal Lips and Perioral Area: None, normal Jaw: None, normal Tongue: None, normal,Extremity Movements Upper (arms, wrists, hands, fingers): None, normal Lower (legs, knees, ankles, toes): None, normal, Trunk Movements Neck, shoulders, hips: None, normal, Overall Severity Severity of abnormal movements (highest score from questions above): None, normal Incapacitation due to abnormal movements: None, normal Patient's awareness of abnormal movements (rate only patient's report): No Awareness, Dental Status Current problems with teeth and/or dentures?: Yes(Poor dentition) Does patient usually wear dentures?: No  CIWA:    COWS:     Musculoskeletal: Strength & Muscle Tone: within normal limits Gait & Station: normal Patient leans: N/A  Psychiatric Specialty Exam: Physical Exam  Nursing note and vitals reviewed. Constitutional: He appears well-developed and well-nourished.  HENT:  Head: Normocephalic and atraumatic.  Eyes: Conjunctivae are normal. Pupils are equal, round, and reactive to light.  Neck: Normal range of motion.  Cardiovascular:  Regular rhythm and normal heart sounds.  Respiratory: Effort normal. No respiratory distress.  GI: Soft.  Musculoskeletal: Normal range of motion.  Neurological: He is alert.  Skin: Skin is warm and dry.  Psychiatric: His affect is blunt. His speech is delayed. He is slowed and withdrawn. Thought content is paranoid. He expresses impulsivity. He expresses no homicidal and no suicidal ideation. He exhibits abnormal recent memory.    Review of Systems  Constitutional: Negative.   HENT: Negative.   Eyes: Negative.   Respiratory: Negative.   Cardiovascular: Negative.   Gastrointestinal: Negative.   Musculoskeletal: Negative.   Skin:  Negative.   Neurological: Negative.   Psychiatric/Behavioral: Positive for hallucinations and memory loss. Negative for depression, substance abuse and suicidal ideas. The patient is not nervous/anxious and does not have insomnia.     Blood pressure 115/78, pulse (!) 103, temperature 98 F (36.7 C), temperature source Oral, resp. rate 18, height 5\' 6"  (1.676 m), weight 93.9 kg (207 lb), SpO2 96 %.Body mass index is 33.41 kg/m.  General Appearance: Disheveled  Eye Contact:  Good  Speech:  Clear and Coherent  Volume:  Decreased  Mood:  Euthymic  Affect:  Constricted  Thought Process:  Disorganized  Orientation:  Full (Time, Place, and Person)  Thought Content:  Illogical  Suicidal Thoughts:  No  Homicidal Thoughts:  No  Memory:  Immediate;   Fair Recent;   Fair Remote;   Fair  Judgement:  Impaired  Insight:  Shallow  Psychomotor Activity:  Decreased  Concentration:  Concentration: Poor  Recall:  [employer] of Knowledge:  Fair  Language:  Fair  Akathisia:  No  Handed:  Right  AIMS (if indicated):     Assets:  Desire for Improvement Resilience  ADL's:  Impaired  Cognition:  Impaired,  Mild  Sleep:  Number of Hours: 7.45     Treatment Plan Summary: Plan Patient appears to be stable and not actually aggressive although still having psychotic  symptoms and it is unclear how he would function outside the hospital.  Treatment team continues to work on finding appropriate disposition.  No change to medicine for today.  Alethia Berthold, MD 07/26/2017, 2:48 PM

## 2017-07-27 NOTE — [facility] Group Notes (Signed)
LCSW Group Therapy Note 07/27/2017 1:15pm  Type of Therapy and Topic: Group Therapy: Feelings Around Returning Home & Establishing a Supportive Framework and Supporting Oneself When Supports Not Available  Participation Level: Did Not Attend  Description of Group:  Patients first processed thoughts and feelings about upcoming discharge. These included fears of upcoming changes, lack of change, new living environments, judgements and expectations from others and overall stigma of mental health issues. The group then discussed the definition of a supportive framework, what that looks and feels like, and how do to discern it from an unhealthy non-supportive network. The group identified different types of supports as well as what to do when your family/friends are less than helpful or unavailable  Therapeutic Goals  1. Patient will identify one healthy supportive network that they can use at discharge. 2. Patient will identify one factor of a supportive framework and how to tell it from an unhealthy network. 3. Patient able to identify one coping skill to use when they do not have positive supports from others. 4. Patient will demonstrate ability to communicate their needs through discussion and/or role plays.  Summary of Patient Progress:   Therapeutic Modalities Cognitive Behavioral Therapy Motivational Interviewing   Benjamin Braun  CUEBAS-COLON, LCSW 07/27/2017 12:44 PM

## 2017-07-27 NOTE — Plan of Care (Signed)
D: Pt denies SI/HI. Pt is pleasant and cooperative. Pt remains reclusive to his room. He is taking his medications. Patient denies A/V hallucinations.  A: Pt was offered support and encouragement. Pt was given scheduled medications. Pt was encourage to attend groups. Q 15 minute checks were done for safety.  R:Patient interacts  well with peers and staff. Pt is taking medication. Pt has no complaints.Pt receptive to treatment and safety maintained on unit.

## 2017-07-27 NOTE — Progress Notes (Signed)
[facility] MD Progress Note  07/27/2017 1:49 PM Benjamin Braun  MRN:  [id number] Subjective: Follow-up patient with schizophrenia.  No new complaint today.  Patient stays sedated much of the time.  Stays in bed a lot.  Has not been agitated or aggressive. Principal Problem: Undifferentiated schizophrenia ([place]) Diagnosis:   Patient Active Problem List   Diagnosis Date Noted  . Undifferentiated schizophrenia ([place]) [F20.3] 11/04/2016  . Noncompliance with treatment [Z91.19] 04/14/2015   Total Time spent with patient: 20 minutes  Past Psychiatric History: History of schizophrenia with a long history of noncompliance  Past Medical History:  Past Medical History:  Diagnosis Date  . Anxiety   . Generalized anxiety disorder   . Schizophrenia [facility])     Past Surgical History:  Procedure Laterality Date  . TONSILLECTOMY     Family History:  Family History  Family history unknown: Yes   Family Psychiatric  History: None Social History:  Social History   Substance and Sexual Activity  Alcohol Use No  . Alcohol/week: 0.0 oz     Social History   Substance and Sexual Activity  Drug Use No    Social History   Socioeconomic History  . Marital status: Single    Spouse name: None  . Number of children: None  . Years of education: None  . Highest education level: None  Social Needs  . Financial resource strain: None  . Food insecurity - worry: None  . Food insecurity - inability: None  . Transportation needs - medical: None  . Transportation needs - non-medical: None  Occupational History  . None  Tobacco Use  . Smoking status: Former Smoker    Types: Cigarettes    Last attempt to quit: 06/11/1999    Years since quitting: 18.1  . Smokeless tobacco: Never Used  Substance and Sexual Activity  . Alcohol use: No    Alcohol/week: 0.0 oz  . Drug use: No  . Sexual activity: No  Other Topics Concern  . None  Social History Narrative  . None   Additional Social History:                          Sleep: Fair  Appetite:  Fair  Current Medications: Current Facility-Administered Medications  Medication Dose Route Frequency Provider Last Rate Last Dose  . acetaminophen (TYLENOL) tablet 650 mg  650 mg Oral Q6H PRN Clapacs, [name], MD   650 mg at 07/25/17 2135  . alum & mag hydroxide-simeth (MAALOX/MYLANTA) 200-200-20 MG/5ML suspension 30 mL  30 mL Oral Q4H PRN Clapacs, [name], MD      . amoxicillin (AMOXIL) capsule 500 mg  500 mg Oral Q8H Pucilowska, Jolanta B, MD   500 mg at 07/27/17 0657  . cloZAPine (CLOZARIL) tablet 250 mg  250 mg Oral QHS Pucilowska, Jolanta B, MD   250 mg at 07/26/17 2109  . divalproex (DEPAKOTE) DR tablet 500 mg  500 mg Oral Q12H Clapacs, [name], MD   500 mg at 07/27/17 0900  . haloperidol (HALDOL) tablet 10 mg  10 mg Oral QHS Pucilowska, Jolanta B, MD   10 mg at 07/26/17 2109  . [START ON 08/07/2017] haloperidol decanoate (HALDOL DECANOATE) 100 MG/ML injection 100 mg  100 mg Intramuscular Q21 days Pucilowska, Jolanta B, MD      . magnesium hydroxide (MILK OF MAGNESIA) suspension 30 mL  30 mL Oral Daily PRN Clapacs, [name], MD  Lab Results:  Results for orders placed or performed during the hospital encounter of 07/16/17 (from the past 48 hour(s))  CBC with Differential/Platelet     Status: None   Collection Time: 07/26/17 11:55 AM  Result Value Ref Range   WBC 7.4 3.8 - 10.6 K/uL   RBC 5.20 4.40 - 5.90 MIL/uL   Hemoglobin 14.9 13.0 - 18.0 g/dL   HCT 44.5 40.0 - 52.0 %   MCV 85.6 80.0 - 100.0 fL   MCH 28.8 26.0 - 34.0 pg   MCHC 33.6 32.0 - 36.0 g/dL   RDW 13.1 11.5 - 14.5 %   Platelets 229 150 - 440 K/uL   Neutrophils Relative % 62 %   Neutro Abs 4.6 1.4 - 6.5 K/uL   Lymphocytes Relative 28 %   Lymphs Abs 2.0 1.0 - 3.6 K/uL   Monocytes Relative 8 %   Monocytes Absolute 0.6 0.2 - 1.0 K/uL   Eosinophils Relative 1 %   Eosinophils Absolute 0.1 0 - 0.7 K/uL   Basophils Relative 1 %   Basophils Absolute 0.1 0 - 0.1 K/uL     Comment: Performed at [facility], [address] [place], [place] [address]    Blood Alcohol level:  Lab Results  Component Value Date   [facility] <10 07/15/2017   ETH <10 09/32/3557    Metabolic Disorder Labs: Lab Results  Component Value Date   HGBA1C 5.4 06/19/2017   MPG 108.28 06/19/2017   MPG 111 11/05/2016   No results found for: PROLACTIN Lab Results  Component Value Date   CHOL 240 (H) 06/19/2017   TRIG 290 (H) 06/19/2017   HDL 36 (L) 06/19/2017   CHOLHDL 6.7 06/19/2017   VLDL 58 (H) 06/19/2017   LDLCALC 146 (H) 06/19/2017   LDLCALC 115 (H) 11/05/2016    Physical Findings: AIMS: Facial and Oral Movements Muscles of Facial Expression: None, normal Lips and Perioral Area: None, normal Jaw: None, normal Tongue: None, normal,Extremity Movements Upper (arms, wrists, hands, fingers): None, normal Lower (legs, knees, ankles, toes): None, normal, Trunk Movements Neck, shoulders, hips: None, normal, Overall Severity Severity of abnormal movements (highest score from questions above): None, normal Incapacitation due to abnormal movements: None, normal Patient's awareness of abnormal movements (rate only patient's report): No Awareness, Dental Status Current problems with teeth and/or dentures?: Yes(Poor dentition) Does patient usually wear dentures?: No  CIWA:    COWS:     Musculoskeletal: Strength & Muscle Tone: within normal limits Gait & Station: normal Patient leans: N/A  Psychiatric Specialty Exam: Physical Exam  Nursing note and vitals reviewed. Constitutional: He appears well-developed and well-nourished.  HENT:  Head: Normocephalic and atraumatic.  Eyes: Conjunctivae are normal. Pupils are equal, round, and reactive to light.  Neck: Normal range of motion.  Cardiovascular: Normal heart sounds.  Respiratory: Effort normal.  GI: Soft.  Musculoskeletal: Normal range of motion.  Neurological: He is alert.  Skin: Skin is warm and dry.   Psychiatric: He has a normal mood and affect. His behavior is normal. Judgment and thought content normal.    Review of Systems  Constitutional: Negative.   HENT: Negative.   Eyes: Negative.   Respiratory: Negative.   Cardiovascular: Negative.   Gastrointestinal: Negative.   Musculoskeletal: Negative.   Skin: Negative.   Neurological: Negative.   Psychiatric/Behavioral: Negative.     Blood pressure 124/77, pulse (!) 111, temperature 98.4 F (36.9 C), resp. rate 18, height 5\' 6"  (1.676 m), weight 93.9 kg (207 lb), SpO2  96 %.Body mass index is 33.41 kg/m.  General Appearance: Casual  Eye Contact:  Minimal  Speech:  Slow  Volume:  Decreased  Mood:  Euthymic  Affect:  Constricted  Thought Process:  Goal Directed  Orientation:  Full (Time, Place, and Person)  Thought Content:  Logical  Suicidal Thoughts:  No  Homicidal Thoughts:  No  Memory:  Immediate;   Fair Recent;   Fair Remote;   Fair  Judgement:  Fair  Insight:  Fair  Psychomotor Activity:  Decreased  Concentration:  Concentration: Fair  Recall:  [employer] of Knowledge:  Fair  Language:  Fair  Akathisia:  No  Handed:  Right  AIMS (if indicated):     Assets:  Desire for Improvement Housing Physical Health Resilience  ADL's:  Intact  Cognition:  Impaired,  Mild  Sleep:  Number of Hours: 7     Treatment Plan Summary: Daily contact with patient to assess and evaluate symptoms and progress in treatment, Medication management and Plan Patient was schizophrenia.  Mildly blunted.  Not agitated not aggressive not expressing overt psychosis.  Still has some problems with insight.  Supportive counseling no change to medicine.  Alethia Berthold, MD 07/27/2017, 1:49 PM

## 2017-07-27 NOTE — [facility] Group Notes (Signed)
[place] Group Notes:  (Nursing/MHT/Case Management/Adjunct)  Date:  07/27/2017  Time:  7:55 AM  Type of Therapy:  Psychoeducational Skills  Participation Level:  Did Not Attend  Summary of Progress/Problems:  Benjamin Braun 07/27/2017, 7:55 AM

## 2017-07-27 NOTE — Plan of Care (Signed)
Pt has been isolative to room. Pt is medication compliant at this time. Pt denis AH/VH/SI/HI and pain at this time. However, throughout the conversation pt was delusional speaking about "God and the highwaysNurse, mental health unclear what patient was collaborating on. Pt was encourage to come out room and interact with peers. Pt refuse. Writer explain to patient some of the goals he needs to be working on. Pt remains on Q 15 mins safety rounds.  Progressing Activity: Will verbalize the importance of balancing activity with adequate rest periods 07/27/2017 2217 - Progressing by Corinna Capra, RN Education: Will be free of psychotic symptoms 07/27/2017 2217 - Progressing by Corinna Capra, RN Knowledge of the prescribed therapeutic regimen will improve 07/27/2017 2217 - Progressing by Corinna Capra, RN Coping: Ability to cope will improve 07/27/2017 2217 - Progressing by Corinna Capra, RN Ability to verbalize feelings will improve 07/27/2017 2217 - Progressing by Corinna Capra, RN Nutritional: Ability to achieve adequate nutritional intake will improve 07/27/2017 2217 - Progressing by Corinna Capra, RN Safety: Ability to redirect hostility and anger into socially appropriate behaviors will improve 07/27/2017 2217 - Progressing by Corinna Capra, RN Ability to remain free from injury will improve 07/27/2017 2217 - Progressing by Corinna Capra, RN Self-Care: Ability to participate in self-care as condition permits will improve 07/27/2017 2217 - Progressing by Corinna Capra, RN Self-Concept: Ability to verbalize positive feelings about self will improve 07/27/2017 2217 - Progressing by Corinna Capra, RN Education: Ability to state activities that reduce stress will improve 07/27/2017 2217 - Progressing by Corinna Capra, RN Self-Concept: Level of anxiety will decrease 07/27/2017 2217 - Progressing by Corinna Capra, RN Ability to modify response to factors that promote anxiety will  improve 07/27/2017 2217 - Progressing by Corinna Capra, RN Education: Knowledge of [place] Education information/materials will improve 07/27/2017 2217 - Progressing by Corinna Capra, RN

## 2017-07-28 MED ORDER — CLOZAPINE 100 MG PO TABS
300.0000 mg | ORAL_TABLET | Freq: Every day | ORAL | Status: DC
  Filled 2017-07-28: qty 12

## 2017-07-28 NOTE — Progress Notes (Addendum)
[facility] MD Progress Note  07/28/2017 1:36 PM Benjamin Braun  MRN:  [id number]  Subjective:   Benjamin Braun is still paranoid and delusional. He is unable to participate in discharge planning or engage with follow up care. He has no complaints. He accepts and tolerates medications well.  Treatment plan. We continue Depakote, Haldol and Clozapine titration. Today 300 mg nightly.  Social/disposition. He will return to his apartment. Follow up TBE.  Principal Problem: Undifferentiated schizophrenia ([place]) Diagnosis:   Patient Active Problem List   Diagnosis Date Noted  . Undifferentiated schizophrenia ([place]) [F20.3] 11/04/2016    Priority: High  . Noncompliance with treatment [Z91.19] 04/14/2015   Total Time spent with patient: 20 minutes  Past Psychiatric History: schizophrenia  Past Medical History:  Past Medical History:  Diagnosis Date  . Anxiety   . Generalized anxiety disorder   . Schizophrenia [facility])     Past Surgical History:  Procedure Laterality Date  . TONSILLECTOMY     Family History:  Family History  Family history unknown: Yes   Family Psychiatric  History: none reported Social History:  Social History   Substance and Sexual Activity  Alcohol Use No  . Alcohol/week: 0.0 oz     Social History   Substance and Sexual Activity  Drug Use No    Social History   Socioeconomic History  . Marital status: Single    Spouse name: None  . Number of children: None  . Years of education: None  . Highest education level: None  Social Needs  . Financial resource strain: None  . Food insecurity - worry: None  . Food insecurity - inability: None  . Transportation needs - medical: None  . Transportation needs - non-medical: None  Occupational History  . None  Tobacco Use  . Smoking status: Former Smoker    Types: Cigarettes    Last attempt to quit: 06/11/1999    Years since quitting: 18.1  . Smokeless tobacco: Never Used  Substance and Sexual Activity  .  Alcohol use: No    Alcohol/week: 0.0 oz  . Drug use: No  . Sexual activity: No  Other Topics Concern  . None  Social History Narrative  . None   Additional Social History:                         Sleep: Fair  Appetite:  Fair  Current Medications: Current Facility-Administered Medications  Medication Dose Route Frequency Provider Last Rate Last Dose  . acetaminophen (TYLENOL) tablet 650 mg  650 mg Oral Q6H PRN Clapacs, [name], MD   650 mg at 07/25/17 2135  . alum & mag hydroxide-simeth (MAALOX/MYLANTA) 200-200-20 MG/5ML suspension 30 mL  30 mL Oral Q4H PRN Clapacs, [name], MD      . cloZAPine (CLOZARIL) tablet 300 mg  300 mg Oral QHS Ola Raap B, MD      . divalproex (DEPAKOTE) DR tablet 500 mg  500 mg Oral Q12H Clapacs, [name], MD   500 mg at 07/27/17 2100  . haloperidol (HALDOL) tablet 10 mg  10 mg Oral QHS Iasha Mccalister B, MD   10 mg at 07/27/17 2153  . [START ON 08/07/2017] haloperidol decanoate (HALDOL DECANOATE) 100 MG/ML injection 100 mg  100 mg Intramuscular Q21 days Antione Obar B, MD      . magnesium hydroxide (MILK OF MAGNESIA) suspension 30 mL  30 mL Oral Daily PRN Clapacs, [name], MD  Lab Results: No results found for this or any previous visit (from the past 48 hour(s)).  Blood Alcohol level:  Lab Results  Component Value Date   ETH <10 07/15/2017   ETH <10 19/62/2297    Metabolic Disorder Labs: Lab Results  Component Value Date   HGBA1C 5.4 06/19/2017   MPG 108.28 06/19/2017   MPG 111 11/05/2016   No results found for: PROLACTIN Lab Results  Component Value Date   CHOL 240 (H) 06/19/2017   TRIG 290 (H) 06/19/2017   HDL 36 (L) 06/19/2017   CHOLHDL 6.7 06/19/2017   VLDL 58 (H) 06/19/2017   LDLCALC 146 (H) 06/19/2017   LDLCALC 115 (H) 11/05/2016    Physical Findings: AIMS: Facial and Oral Movements Muscles of Facial Expression: None, normal Lips and Perioral Area: None, normal Jaw: None, normal Tongue: None,  normal,Extremity Movements Upper (arms, wrists, hands, fingers): None, normal Lower (legs, knees, ankles, toes): None, normal, Trunk Movements Neck, shoulders, hips: None, normal, Overall Severity Severity of abnormal movements (highest score from questions above): None, normal Incapacitation due to abnormal movements: None, normal Patient's awareness of abnormal movements (rate only patient's report): No Awareness, Dental Status Current problems with teeth and/or dentures?: Yes(Poor dentition) Does patient usually wear dentures?: No  CIWA:    COWS:     Musculoskeletal: Strength & Muscle Tone: within normal limits Gait & Station: normal Patient leans: N/A  Psychiatric Specialty Exam: Physical Exam  Nursing note and vitals reviewed. Psychiatric: His affect is blunt. His speech is rapid and/or pressured. He is slowed, withdrawn and actively hallucinating. Thought content is paranoid and delusional. Cognition and memory are normal. He expresses impulsivity.    Review of Systems  Neurological: Negative.   Psychiatric/Behavioral: Positive for hallucinations.  All other systems reviewed and are negative.   Blood pressure 124/77, pulse (!) 111, temperature 98.4 F (36.9 C), resp. rate 18, height 5\' 6"  (1.676 m), weight 93.9 kg (207 lb), SpO2 96 %.Body mass index is 33.41 kg/m.  General Appearance: Disheveled  Eye Contact:  Fair  Speech:  Slurred  Volume:  Increased  Mood:  Dysphoric  Affect:  Congruent  Thought Process:  Goal Directed and Descriptions of Associations: Intact  Orientation:  Full (Time, Place, and Person)  Thought Content:  WDL  Suicidal Thoughts:  No  Homicidal Thoughts:  No  Memory:  Immediate;   Fair Recent;   Fair Remote;   Fair  Judgement:  Poor  Insight:  Lacking  Psychomotor Activity:  Decreased  Concentration:  Concentration: Fair and Attention Span: Fair  Recall:  [employer] of Knowledge:  Fair  Language:  Fair  Akathisia:  No  Handed:  Right   AIMS (if indicated):     Assets:  Communication Skills Desire for Improvement Financial Resources/Insurance Housing Physical Health Resilience Social Support  ADL's:  Intact  Cognition:  WNL  Sleep:  Number of Hours: 7.3     Treatment Plan Summary: Daily contact with patient to assess and evaluate symptoms and progress in treatment and Medication management   Benjamin Braun is a 52 year old male with a history of schizophrenia admitted floridly psychotic in the context of medication noncompliance.  #Psychosis -discontinueZyprexa  -continueHaldol decanoate100 mgmonthly injections, next injection on 3/7 -continueDepakote500 mg BID. VPA level was 76 on 2/16 -continueHaldol 10 mg nightly -continue Clozapine titration,300 mg today on 2/18  #Toothabscess -discontinue Amoxicillin  #Metabolic syndrome monitoring -Lipid panel, TSH and HgbA1C were recently done -EKG, QTc 435  #Disposition -TBE -discharge to  his apartment -followup with [facility] outpatient commitment  I certify that the services received since the previous certification/recertification were and continue to be medically necessary as the treatment provided can be reasonably expected to improve the patient's condition; the medical record documents that the services furnished were intensive treatment services or their equivalent services, and this patient continues to need, on a daily basis, active treatment furnished directly by or requiring the supervision of inpatient psychiatric personnel.     Orson Slick, MD 07/28/2017, 1:36 PM

## 2017-07-28 NOTE — Plan of Care (Signed)
Patient states that he didn't sleep too well last night, which is why he has been sleeping in most of the morning into the afternoon. Patient denies SI/HI/AVH at this time. Patient verbalizes understanding of the general information that has been provided to him and has not voiced any further questions or concerns at this time. Patient has the ability to cope as well as achieve adequate nutrition. Patient rates his depression level a "2/10" and his anxiety level a "7/10" stating that it's because "I was hoping to go home, I have a dog that I take care of". Patient has the ability to redirect anger and participates in self-care as condition permits. Patient has remained free from injury and is safe on the unit at this time.

## 2017-07-28 NOTE — [facility] Group Notes (Signed)
LCSW Group Therapy Note   07/28/2017 1:00pm   Type of Therapy and Topic:  Group Therapy:  Overcoming Obstacles   Participation Level:  Did Not Attend   Description of Group:    In this group patients will be encouraged to explore what they see as obstacles to their own wellness and recovery. They will be guided to discuss their thoughts, feelings, and behaviors related to these obstacles. The group will process together ways to cope with barriers, with attention given to specific choices patients can make. Each patient will be challenged to identify changes they are motivated to make in order to overcome their obstacles. This group will be process-oriented, with patients participating in exploration of their own experiences as well as giving and receiving support and challenge from other group members.   Therapeutic Goals: 1. Patient will identify personal and current obstacles as they relate to admission. 2. Patient will identify barriers that currently interfere with their wellness or overcoming obstacles.  3. Patient will identify feelings, thought process and behaviors related to these barriers. 4. Patient will identify two changes they are willing to make to overcome these obstacles:      Summary of Patient Progress      Therapeutic Modalities:   Cognitive Behavioral Therapy Solution Focused Therapy Motivational Interviewing Relapse Prevention Therapy  August Saucer, LCSW 07/28/2017 3:15 PM

## 2017-07-28 NOTE — Progress Notes (Signed)
D- Patient alert and oriented. Patient presents in a pleasant mood on assessment. Patient reports a depression level of "2/10" and an anxiety level of "7/10" stating it's because "I was hoping to go home, I have a dog at home that I take care of". Patient denies SI, HI, AVH, and pain at this time. Patient has no stated goal for today.  A- Support and encouragement provided.  Routine safety checks conducted every 15 minutes.  Patient informed to notify staff with problems or concerns.  R- Patient contracts for safety at this time. Patient compliant with treatment plan. Patient receptive, calm, and cooperative. Patient interacts well with others on the unit.  Patient remains safe at this time.

## 2017-07-29 MED ORDER — CLOZAPINE 100 MG PO TABS
200.0000 mg | ORAL_TABLET | Freq: Every day | ORAL | Status: DC
  Administered 2017-07-29 – 2017-07-30 (×2): 200 mg via ORAL
  Filled 2017-07-29 (×2): qty 2

## 2017-07-29 NOTE — [facility] Group Notes (Signed)
  07/29/2017  Time: 0900  Type of Therapy and Topic:  Group Therapy:  Setting Goals Participation Level:  Did Not Attend  Description of Group: In this process group, patients discussed using strengths to work toward goals and address challenges.  Patients identified two positive things about themselves and one goal they were working on.  Patients were given the opportunity to share openly and support each other's plan for self-empowerment.  The group discussed the value of gratitude and were encouraged to have a daily reflection of positive characteristics or circumstances.  Patients were encouraged to identify a plan to utilize their strengths to work on current challenges and goals.  Therapeutic Goals 1. Patient will verbalize personal strengths/positive qualities and relate how these can assist with achieving desired personal goals 2. Patients will verbalize affirmation of peers plans for personal change and goal setting 3. Patients will explore the value of gratitude and positive focus as related to successful achievement of goals 4. Patients will verbalize a plan for regular reinforcement of personal positive qualities and circumstances.  Summary of Patient Progress: Pt was invited to attend group but chose not to attend. CSW will continue to encourage pt to attend group throughout their admission.     Therapeutic Modalities Cognitive Behavioral Therapy Motivational Interviewing  Alden Hipp, MSW, LCSW 07/29/2017 9:39 AM

## 2017-07-29 NOTE — Progress Notes (Signed)
[facility] MD Progress Note  07/29/2017 12:57 PM Benjamin Braun  MRN:  [id number]  Subjective:  Benjamin Braun seems better today. For the first time he is not arguing with me about being fine and about discharge. He worries about his dog who is in the care of his neighbor. He seems less paranoid. He is still mostly withdrawn to his room and does not interact with peers or staff. He feels "tired". Last night, he was not given medications as he was too sleepy.  Treatment [plan. We will continue Haldol decanoate, discontinue oral haldol, continue Depkote and lower Clozapine to 200 mg due to sedation. He may require higher dose of injectable Haldol. He will meet with prospective ACT team before discharge.  Social/disposition. He will return home to his apartment. Follow up hopefully with [facility].  Principal Problem: Undifferentiated schizophrenia [facility]) Diagnosis:   Patient Active Problem List   Diagnosis Date Noted  . Undifferentiated schizophrenia ([place]) [F20.3] 11/04/2016    Priority: High  . Noncompliance with treatment [Z91.19] 04/14/2015   Total Time spent with patient: 30 minutes  Past Psychiatric History: schizophrenia  Past Medical History:  Past Medical History:  Diagnosis Date  . Anxiety   . Generalized anxiety disorder   . Schizophrenia [facility])     Past Surgical History:  Procedure Laterality Date  . TONSILLECTOMY     Family History:  Family History  Family history unknown: Yes   Family Psychiatric  History: none reported Social History:  Social History   Substance and Sexual Activity  Alcohol Use No  . Alcohol/week: 0.0 oz     Social History   Substance and Sexual Activity  Drug Use No    Social History   Socioeconomic History  . Marital status: Single    Spouse name: None  . Number of children: None  . Years of education: None  . Highest education level: None  Social Needs  . Financial resource strain: None  . Food insecurity - worry: None   . Food insecurity - inability: None  . Transportation needs - medical: None  . Transportation needs - non-medical: None  Occupational History  . None  Tobacco Use  . Smoking status: Former Smoker    Types: Cigarettes    Last attempt to quit: 06/11/1999    Years since quitting: 18.1  . Smokeless tobacco: Never Used  Substance and Sexual Activity  . Alcohol use: No    Alcohol/week: 0.0 oz  . Drug use: No  . Sexual activity: No  Other Topics Concern  . None  Social History Narrative  . None   Additional Social History:                         Sleep: Fair  Appetite:  Fair  Current Medications: Current Facility-Administered Medications  Medication Dose Route Frequency Provider Last Rate Last Dose  . acetaminophen (TYLENOL) tablet 650 mg  650 mg Oral Q6H PRN Clapacs, [name], MD   650 mg at 07/25/17 2135  . alum & mag hydroxide-simeth (MAALOX/MYLANTA) 200-200-20 MG/5ML suspension 30 mL  30 mL Oral Q4H PRN Clapacs, [name], MD      . cloZAPine (CLOZARIL) tablet 200 mg  200 mg Oral QHS Rennae Ferraiolo B, MD      . divalproex (DEPAKOTE) DR tablet 500 mg  500 mg Oral Q12H Clapacs, [name], MD   500 mg at 07/29/17 0842  . [START ON 08/07/2017] haloperidol decanoate (  HALDOL DECANOATE) 100 MG/ML injection 100 mg  100 mg Intramuscular Q21 days Grae Leathers B, MD      . magnesium hydroxide (MILK OF MAGNESIA) suspension 30 mL  30 mL Oral Daily PRN Clapacs, [name], MD        Lab Results: No results found for this or any previous visit (from the past 48 hour(s)).  Blood Alcohol level:  Lab Results  Component Value Date   ETH <10 07/15/2017   ETH <10 16/03/9603    Metabolic Disorder Labs: Lab Results  Component Value Date   HGBA1C 5.4 06/19/2017   MPG 108.28 06/19/2017   MPG 111 11/05/2016   No results found for: PROLACTIN Lab Results  Component Value Date   CHOL 240 (H) 06/19/2017   TRIG 290 (H) 06/19/2017   HDL 36 (L) 06/19/2017   CHOLHDL 6.7 06/19/2017    VLDL 58 (H) 06/19/2017   LDLCALC 146 (H) 06/19/2017   LDLCALC 115 (H) 11/05/2016    Physical Findings: AIMS: Facial and Oral Movements Muscles of Facial Expression: None, normal Lips and Perioral Area: None, normal Jaw: None, normal Tongue: None, normal,Extremity Movements Upper (arms, wrists, hands, fingers): None, normal Lower (legs, knees, ankles, toes): None, normal, Trunk Movements Neck, shoulders, hips: None, normal, Overall Severity Severity of abnormal movements (highest score from questions above): None, normal Incapacitation due to abnormal movements: None, normal Patient's awareness of abnormal movements (rate only patient's report): No Awareness, Dental Status Current problems with teeth and/or dentures?: Yes(Poor dentition) Does patient usually wear dentures?: No  CIWA:    COWS:     Musculoskeletal: Strength & Muscle Tone: within normal limits Gait & Station: normal Patient leans: N/A  Psychiatric Specialty Exam: Physical Exam  Nursing note and vitals reviewed. Psychiatric: His speech is normal. His affect is blunt. He is withdrawn. Thought content is paranoid and delusional. Cognition and memory are normal. He expresses impulsivity.    Review of Systems  Neurological: Negative.   Psychiatric/Behavioral: Positive for hallucinations.  All other systems reviewed and are negative.   Blood pressure 104/71, pulse (!) 103, temperature 98.4 F (36.9 C), temperature source Oral, resp. rate 18, height 5\' 6"  (1.676 m), weight 93.9 kg (207 lb), SpO2 96 %.Body mass index is 33.41 kg/m.  General Appearance: Casual  Eye Contact:  Good  Speech:  Slurred  Volume:  Normal  Mood:  Dysphoric  Affect:  Congruent  Thought Process:  Goal Directed and Descriptions of Associations: Intact  Orientation:  Full (Time, Place, and Person)  Thought Content:  Delusions and Paranoid Ideation  Suicidal Thoughts:  No  Homicidal Thoughts:  No  Memory:  Immediate;   Fair Recent;    Fair Remote;   Fair  Judgement:  Poor  Insight:  Lacking  Psychomotor Activity:  Psychomotor Retardation  Concentration:  Concentration: Fair and Attention Span: Fair  Recall:  [employer] of Knowledge:  Fair  Language:  Fair  Akathisia:  No  Handed:  Right  AIMS (if indicated):     Assets:  Communication Skills Desire for Improvement Financial Resources/Insurance Housing Physical Health Resilience Social Support  ADL's:  Intact  Cognition:  WNL  Sleep:  Number of Hours: 8     Treatment Plan Summary: Daily contact with patient to assess and evaluate symptoms and progress in treatment and Medication management   Benjamin Braun is a 52 year old male with a history of schizophrenia admitted floridly psychotic in the context of medication noncompliance.  #Psychosis -continueHaldol decanoate100 mgmonthly injections, next  injection on 3/7 -continueDepakote500 mg BID. VPA level was 76 on 2/16 -discontinueHaldol 10 mg nightly -lower Clozapine to 200 mg nightly due to sedatiion  #Toothabscess -discontinue Amoxicillin  #Metabolic syndrome monitoring -Lipid panel, TSH and HgbA1C were recently done -EKG, QTc 435  #Disposition -TBE -discharge to his apartment -followup with [facility]on outpatient commitment    Orson Slick, MD 07/29/2017, 12:57 PM

## 2017-07-29 NOTE — [facility] Group Notes (Signed)
[facility] Group Notes:  (Nursing/MHT/Case Management/Adjunct)  Date:  07/29/2017  Time:  9:40 PM  Type of Therapy:  Group Therapy  Participation Level:  Minimal  Participation Quality:  Standing in hall not in day room.   Nehemiah Settle 07/29/2017, 9:40 PM

## 2017-07-29 NOTE — [facility] Group Notes (Signed)
07/29/2017 1PM  Type of Therapy/Topic:  Group Therapy:  Feelings about Diagnosis  Participation Level:  Did Not Attend   Description of Group:   This group will allow patients to explore their thoughts and feelings about diagnoses they have received. Patients will be guided to explore their level of understanding and acceptance of these diagnoses. Facilitator will encourage patients to process their thoughts and feelings about the reactions of others to their diagnosis and will guide patients in identifying ways to discuss their diagnosis with significant others in their lives. This group will be process-oriented, with patients participating in exploration of their own experiences, giving and receiving support, and processing challenge from other group members.   Therapeutic Goals: 1. Patient will demonstrate understanding of diagnosis as evidenced by identifying two or more symptoms of the disorder 2. Patient will be able to express two feelings regarding the diagnosis 3. Patient will demonstrate their ability to communicate their needs through discussion and/or role play  Summary of Patient Progress: Patient was encouraged and invited to attend group. Patient did not attend group. Social worker will continue to encourage group participation in the future.        Therapeutic Modalities:   Cognitive Behavioral Therapy Brief Therapy Feelings Identification    Darin Engels, Marlinda Mike 07/29/2017 2:41 PM

## 2017-07-29 NOTE — Progress Notes (Signed)
D: Pt was up earlier, but went to sleep and was too lethargic to take night medications A: Pt was offered support and encouragement.  Pt was encourage to attend groups. Q 15 minute checks were done for safety.  R: safety maintained on unit.

## 2017-07-29 NOTE — Plan of Care (Addendum)
Patient found on phone upon my arrival. Patient is visible but not social throughout the evening. Denies pain. Denies SI, HI, AVH. Reports eating and voiding adequately. Compliant with HS medications and staff direction. Q 15 minute checks maintained. Will continue to monitor throughout the shift. Patient slept 8 hours. No apparent distress. Will endorse care to oncoming shift.   Progressing Coping: Ability to cope will improve 07/29/2017 2215 - Progressing by Derek Mound, RN Nutritional: Ability to achieve adequate nutritional intake will improve 07/29/2017 2215 - Progressing by Derek Mound, RN Safety: Ability to remain free from injury will improve 07/29/2017 2215 - Progressing by Derek Mound, RN Self-Concept: Ability to verbalize positive feelings about self will improve 07/29/2017 2215 - Progressing by Derek Mound, RN Self-Concept: Level of anxiety will decrease 07/29/2017 2215 - Progressing by Derek Mound, RN

## 2017-07-29 NOTE — Progress Notes (Signed)
D- Patient alert and oriented. Patient presents in a pleasant mood on assessment. Patient isolates to his throughout the day sleeping. Patient is also a little agitated because he wants to go home. Patient stated "am I a danger to myself or anybody else, why aren't they letting me go home". Patient states that he hears voices "I hear them but I don't have a memory of what they're saying. The best thing to do is ignore them, so you won't get drawn into the fight". Patient also stated "shut the gates and windows of heaven". Patient denies SI, HI, and visual hallucinations at this time. Patient also denies any depression/anxiety at this time.  A- Scheduled medications administered to patient, per MD orders. Support and encouragement provided.  Routine safety checks conducted every 15 minutes.  Patient informed to notify staff with problems or concerns.  R- No adverse drug reactions noted. Patient contracts for safety at this time. Patient compliant with medications and treatment plan. Patient receptive, calm, and cooperative. Patient interacts well with others on the unit.  Patient remains safe at this time.

## 2017-07-29 NOTE — Tx Team (Signed)
Interdisciplinary Treatment and Diagnostic Plan Update  07/28/2017 Time of Session: 10:40 AM Benjamin Braun MRN: [id number]  Principal Diagnosis: Undifferentiated schizophrenia [facility])  Secondary Diagnoses: Principal Problem:   Undifferentiated schizophrenia ([place]) Active Problems:   Noncompliance with treatment   Current Medications:  Current Facility-Administered Medications  Medication Dose Route Frequency Provider Last Rate Last Dose  . acetaminophen (TYLENOL) tablet 650 mg  650 mg Oral Q6H PRN Clapacs, [name], MD   650 mg at 07/25/17 2135  . alum & mag hydroxide-simeth (MAALOX/MYLANTA) 200-200-20 MG/5ML suspension 30 mL  30 mL Oral Q4H PRN Clapacs, [name], MD      . cloZAPine (CLOZARIL) tablet 200 mg  200 mg Oral QHS Pucilowska, Jolanta B, MD      . divalproex (DEPAKOTE) DR tablet 500 mg  500 mg Oral Q12H Clapacs, [name], MD   500 mg at 07/29/17 0842  . [START ON 08/07/2017] haloperidol decanoate (HALDOL DECANOATE) 100 MG/ML injection 100 mg  100 mg Intramuscular Q21 days Pucilowska, Jolanta B, MD      . magnesium hydroxide (MILK OF MAGNESIA) suspension 30 mL  30 mL Oral Daily PRN Clapacs, [name], MD       PTA Medications: Medications Prior to Admission  Medication Sig Dispense Refill Last Dose  . divalproex (DEPAKOTE) 500 MG DR tablet Take 1 tablet (500 mg total) by mouth every 12 (twelve) hours. 60 tablet 1 07/16/2017 at 1051  . haloperidol decanoate (HALDOL DECANOATE) 100 MG/ML injection Inject 1 mL (100 mg total) into the muscle every 30 (thirty) days. Next injection on 12/09/2016. 1 mL 1 07/16/2017 at Unknown per pt.  Marland Kitchen OLANZapine (ZYPREXA) 15 MG tablet Take 1 tablet (15 mg total) by mouth at bedtime. 30 tablet 1 07/16/2017 at Unknown per pt.    Patient Stressors: Medication change or noncompliance  Patient Strengths: Ability for insight Average or above average intelligence General fund of knowledge Motivation for treatment/growth  Treatment Modalities: Medication Management,  Group therapy, Case management,  1 to 1 session with clinician, Psychoeducation, Recreational therapy.   Physician Treatment Plan for Primary Diagnosis: Undifferentiated schizophrenia ([place]) Long Term Goal(s): Improvement in symptoms so as ready for discharge NA   Short Term Goals: Ability to identify changes in lifestyle to reduce recurrence of condition will improve Ability to verbalize feelings will improve Ability to disclose and discuss suicidal ideas Ability to demonstrate self-control will improve Ability to identify and develop effective coping behaviors will improve Ability to maintain clinical measurements within normal limits will improve Compliance with prescribed medications will improve Ability to identify triggers associated with substance abuse/mental health issues will improve NA  Medication Management: Evaluate patient's response, side effects, and tolerance of medication regimen.  Therapeutic Interventions: 1 to 1 sessions, Unit Group sessions and Medication administration.  Evaluation of Outcomes: Progressing  Physician Treatment Plan for Secondary Diagnosis: Principal Problem:   Undifferentiated schizophrenia ([place]) Active Problems:   Noncompliance with treatment  Long Term Goal(s): Improvement in symptoms so as ready for discharge NA   Short Term Goals: Ability to identify changes in lifestyle to reduce recurrence of condition will improve Ability to verbalize feelings will improve Ability to disclose and discuss suicidal ideas Ability to demonstrate self-control will improve Ability to identify and develop effective coping behaviors will improve Ability to maintain clinical measurements within normal limits will improve Compliance with prescribed medications will improve Ability to identify triggers associated with substance abuse/mental health issues will improve NA     Medication Management: Evaluate patient's  response, side effects, and tolerance of  medication regimen.  Therapeutic Interventions: 1 to 1 sessions, Unit Group sessions and Medication administration.  Evaluation of Outcomes: Progressing   RN Treatment Plan for Primary Diagnosis: Undifferentiated schizophrenia ([place]) Long Term Goal(s): Knowledge of disease and therapeutic regimen to maintain health will improve  Short Term Goals: Ability to demonstrate self-control, Ability to identify and develop effective coping behaviors will improve and Compliance with prescribed medications will improve  Medication Management: RN will administer medications as ordered by provider, will assess and evaluate patient's response and provide education to patient for prescribed medication. RN will report any adverse and/or side effects to prescribing provider.  Therapeutic Interventions: 1 on 1 counseling sessions, Psychoeducation, Medication administration, Evaluate responses to treatment, Monitor vital signs and CBGs as ordered, Perform/monitor CIWA, COWS, AIMS and Fall Risk screenings as ordered, Perform wound care treatments as ordered.  Evaluation of Outcomes: Progressing   LCSW Treatment Plan for Primary Diagnosis: Undifferentiated schizophrenia ([place]) Long Term Goal(s): Safe transition to appropriate next level of care at discharge, Engage patient in therapeutic group addressing interpersonal concerns.  Short Term Goals: Engage patient in aftercare planning with referrals and resources and Increase skills for wellness and recovery  Therapeutic Interventions: Assess for all discharge needs, 1 to 1 time with Social worker, Explore available resources and support systems, Assess for adequacy in community support network, Educate family and significant other(s) on suicide prevention, Complete Psychosocial Assessment, Interpersonal group therapy.  Evaluation of Outcomes: Progressing   Progress in Treatment: Attending groups: No. Participating in groups: No. Taking medication as  prescribed: Yes. Toleration medication: Yes. Family/Significant other contact made: No, will contact:  CSW will contact pt's brother, Benjamin Braun, per his request. Patient understands diagnosis: No. Discussing patient identified problems/goals with staff: Yes. Medical problems stabilized or resolved: Yes. Denies suicidal/homicidal ideation: Yes. Issues/concerns per patient self-inventory: No. Other: n/a  New problem(s) identified: No, Describe:  No new problems identified  New Short Term/Long Term Goal(s): Merril was unable to clearly state a goal. He continues to struggle with a delusional thought pattern.  Discharge Plan or Barriers: Tentative discharge plan is for Tarvis to discharge back to his home with ACT services in place.  Reason for Continuation of Hospitalization: Delusions  Hallucinations Medication stabilization  Estimated Length of Stay: 5-7 days  Attendees: Patient:   Physician: Orson Slick, MD 07/28/2017  Nursing: Polly Cobia, RN 07/28/2017  RN Care Manager:   Social Worker: Dossie Arbour, LCSW 07/28/2017  Recreational Therapist:    Other:    Other:    Other:     Scribe for Treatment Team: August Saucer, LCSW 07/29/2017 5:01 PM

## 2017-07-29 NOTE — Plan of Care (Signed)
Patient reports that he didn't sleep well last night. Patient denies SI/HI and visual hallucinations at this time. Patient also denies any signs/symptoms of depression/anxiety at this time. Patient endorses auditory hallucinations stating "I don't have a memory of what they're saying, the best thing to do with voices is ignore them, you won't get drawn into the fight". Patient verbalizes understanding of the general information that has been provided to him. Patient questions /concerns addressed and answered. Patient has been in compliance with his therapeutic regimen thus far. Patient has achieved adequate nutrition while on the unit by eating his meals. Patient has the ability to redirect hostility into appropriate behaviors and has remained free from injury on the unit. Patient has asked this Probation officer for supplies in order for him to participate in self-care needs. Patient states that he is ready to go home stating "am I a danger to myself or anybody else, why won't they let me go home". Patient is safe on the unit at this time.

## 2017-07-30 NOTE — [facility] Group Notes (Signed)
[facility] Group Notes:  (Nursing/MHT/Case Management/Adjunct)  Date:  07/30/2017  Time:  2:47 PM  Type of Therapy:  Psychoeducational Skills  Participation Level:  Did Not Attend  Adela Lank [facility] 07/30/2017, 2:47 PM

## 2017-07-30 NOTE — Plan of Care (Addendum)
Patient found in day room upon my arrival. Patient is visible but not social with peers. Reports feeling tired throughout the day. Educated patient that there is a period of time necessary to adjust to the medication and that he may feel tired until the medication reaches a therapeutic level. Patient verbalized understanding. Teaching is more difficult due to patient's hearing impairment. Denies SI, HI, AVH. Denies pain. Reports eating, sleeping, and voiding adequately. Compliant with HS medications and staff direction. Q 15 minute checks maintained. Will continue to monitor throughout the shift.  Patient slept 7.5 hours. No apparent distress. Will endorse care to oncoming shift.   Progressing Education: Knowledge of the prescribed therapeutic regimen will improve 07/30/2017 2129 - Progressing by Derek Mound, RN Coping: Ability to cope will improve 07/30/2017 2129 - Progressing by Derek Mound, RN Nutritional: Ability to achieve adequate nutritional intake will improve 07/30/2017 2129 - Progressing by Derek Mound, RN Safety: Ability to remain free from injury will improve 07/30/2017 2129 - Progressing by Derek Mound, RN Self-Concept: Level of anxiety will decrease 07/30/2017 2129 - Progressing by Derek Mound, RN

## 2017-07-30 NOTE — Progress Notes (Signed)
D: Patient did not attend any of the group activity  This shift . Isolates to his room  Patient comes out for meals and snack .Thought process remained  Altered . Continue  to observed to talk to himself   Word salad  when spoken  To patient  Bathing  encourage  By staff Sleep hard  Difficult to wake up No pain concerns .Limited Interacting with peers and staff.   A: Encourage patient participation with unit programming . Instruction  Given on  Medication , verbalize understanding.  R: Voice no other concerns. Staff continue to monitor

## 2017-07-30 NOTE — Plan of Care (Signed)
Patient verbalizing understanding of  information received   Some  evidence  of use of coping skills  observed . No safety concerns voiced . Less anxiety Progressing Education: Knowledge of the prescribed therapeutic regimen will improve 07/30/2017 1842 - Progressing by Leodis Liverpool, RN Coping: Ability to cope will improve 07/30/2017 1842 - Progressing by Leodis Liverpool, RN Ability to verbalize feelings will improve 07/30/2017 1842 - Progressing by Leodis Liverpool, RN Nutritional: Ability to achieve adequate nutritional intake will improve 07/30/2017 1842 - Progressing by Leodis Liverpool, RN Safety: Ability to redirect hostility and anger into socially appropriate behaviors will improve 07/30/2017 1842 - Progressing by Leodis Liverpool, RN Ability to remain free from injury will improve 07/30/2017 1842 - Progressing by Leodis Liverpool, RN Self-Concept: Ability to verbalize positive feelings about self will improve 07/30/2017 1842 - Progressing by Leodis Liverpool, RN Self-Concept: Level of anxiety will decrease 07/30/2017 1842 - Progressing by Leodis Liverpool, RN Education: Knowledge of [facility] Education information/materials will improve 07/30/2017 1842 - Progressing by Leodis Liverpool, RN

## 2017-07-30 NOTE — [facility] Group Notes (Signed)
LCSW Group Therapy Note  07/30/2017 1:00 pm  Type of Therapy/Topic:  Group Therapy:  Emotion Regulation  Participation Level:  Did Not Attend   Description of Group:    The purpose of this group is to assist patients in learning to regulate negative emotions and experience positive emotions. Patients will be guided to discuss ways in which they have been vulnerable to their negative emotions. These vulnerabilities will be juxtaposed with experiences of positive emotions or situations, and patients will be challenged to use positive emotions to combat negative ones. Special emphasis will be placed on coping with negative emotions in conflict situations, and patients will process healthy conflict resolution skills.  Therapeutic Goals: 1. Patient will identify two positive emotions or experiences to reflect on in order to balance out negative emotions 2. Patient will label two or more emotions that they find the most difficult to experience 3. Patient will demonstrate positive conflict resolution skills through discussion and/or role plays  Summary of Patient Progress:       Therapeutic Modalities:   Cognitive Behavioral Therapy Feelings Identification Dialectical Behavioral Therapy

## 2017-07-30 NOTE — Progress Notes (Signed)
[facility] MD Progress Note  07/30/2017 1:21 PM Benjamin Braun  MRN:  [id number]  Subjective:  [name] has no complaints except feeling tired. He did take Clozapine last night. Denies any psychotic symptoms but is still paranoid. He knows that he will not be discharged unless he agrees to follow up in the community.  We will ask [employer] team leader for assessment in the hospital as he was not able to connwct with them from home.  Treatment plan. We continue Depakote, Haldol injections and Clozapine titration. Will go to 250 mg tomorrow.  Social/disposition. Discharge to his apartment. Follow up TBE.  Principal Problem: Undifferentiated schizophrenia ([place]) Diagnosis:   Patient Active Problem List   Diagnosis Date Noted  . Undifferentiated schizophrenia ([place]) [F20.3] 11/04/2016    Priority: High  . Noncompliance with treatment [Z91.19] 04/14/2015   Total Time spent with patient: 20 minutes  Past Psychiatric History: schizophrenia.  Past Medical History:  Past Medical History:  Diagnosis Date  . Anxiety   . Generalized anxiety disorder   . Schizophrenia [facility])     Past Surgical History:  Procedure Laterality Date  . TONSILLECTOMY     Family History:  Family History  Family history unknown: Yes   Family Psychiatric  History: none reported Social History:  Social History   Substance and Sexual Activity  Alcohol Use No  . Alcohol/week: 0.0 oz     Social History   Substance and Sexual Activity  Drug Use No    Social History   Socioeconomic History  . Marital status: Single    Spouse name: None  . Number of children: None  . Years of education: None  . Highest education level: None  Social Needs  . Financial resource strain: None  . Food insecurity - worry: None  . Food insecurity - inability: None  . Transportation needs - medical: None  . Transportation needs - non-medical: None  Occupational History  . None  Tobacco Use  . Smoking status: Former  Smoker    Types: Cigarettes    Last attempt to quit: 06/11/1999    Years since quitting: 18.1  . Smokeless tobacco: Never Used  Substance and Sexual Activity  . Alcohol use: No    Alcohol/week: 0.0 oz  . Drug use: No  . Sexual activity: No  Other Topics Concern  . None  Social History Narrative  . None   Additional Social History:                         Sleep: Fair  Appetite:  Poor  Current Medications: Current Facility-Administered Medications  Medication Dose Route Frequency Provider Last Rate Last Dose  . acetaminophen (TYLENOL) tablet 650 mg  650 mg Oral Q6H PRN Clapacs, [name], MD   650 mg at 07/25/17 2135  . alum & mag hydroxide-simeth (MAALOX/MYLANTA) 200-200-20 MG/5ML suspension 30 mL  30 mL Oral Q4H PRN Clapacs, [name], MD      . cloZAPine (CLOZARIL) tablet 200 mg  200 mg Oral QHS Tc Kapusta B, MD   200 mg at 07/29/17 2126  . divalproex (DEPAKOTE) DR tablet 500 mg  500 mg Oral Q12H Clapacs, [name], MD   500 mg at 07/30/17 0740  . [START ON 08/07/2017] haloperidol decanoate (HALDOL DECANOATE) 100 MG/ML injection 100 mg  100 mg Intramuscular Q21 days Daylan Boggess B, MD      . magnesium hydroxide (MILK OF MAGNESIA) suspension 30 mL  30  mL Oral Daily PRN Clapacs, [name], MD        Lab Results: No results found for this or any previous visit (from the past 48 hour(s)).  Blood Alcohol level:  Lab Results  Component Value Date   ETH <10 07/15/2017   ETH <10 42/59/5638    Metabolic Disorder Labs: Lab Results  Component Value Date   HGBA1C 5.4 06/19/2017   MPG 108.28 06/19/2017   MPG 111 11/05/2016   No results found for: PROLACTIN Lab Results  Component Value Date   CHOL 240 (H) 06/19/2017   TRIG 290 (H) 06/19/2017   HDL 36 (L) 06/19/2017   CHOLHDL 6.7 06/19/2017   VLDL 58 (H) 06/19/2017   LDLCALC 146 (H) 06/19/2017   LDLCALC 115 (H) 11/05/2016    Physical Findings: AIMS: Facial and Oral Movements Muscles of Facial Expression:  None, normal Lips and Perioral Area: None, normal Jaw: None, normal Tongue: None, normal,Extremity Movements Upper (arms, wrists, hands, fingers): None, normal Lower (legs, knees, ankles, toes): None, normal, Trunk Movements Neck, shoulders, hips: None, normal, Overall Severity Severity of abnormal movements (highest score from questions above): None, normal Incapacitation due to abnormal movements: None, normal Patient's awareness of abnormal movements (rate only patient's report): No Awareness, Dental Status Current problems with teeth and/or dentures?: Yes(Poor dentition) Does patient usually wear dentures?: No  CIWA:    COWS:     Musculoskeletal: Strength & Muscle Tone: within normal limits Gait & Station: normal Patient leans: N/A  Psychiatric Specialty Exam: Physical Exam  Nursing note and vitals reviewed. Psychiatric: His speech is normal. His affect is blunt. He is withdrawn. Thought content is paranoid and delusional. Cognition and memory are normal. He expresses impulsivity.    Review of Systems  Neurological: Negative.   Psychiatric/Behavioral: Positive for hallucinations.  All other systems reviewed and are negative.   Blood pressure 105/66, pulse 91, temperature 98 F (36.7 C), temperature source Oral, resp. rate 18, height 5\' 6"  (1.676 m), weight 93.9 kg (207 lb), SpO2 96 %.Body mass index is 33.41 kg/m.  General Appearance: Casual  Eye Contact:  Good  Speech:  Slurred  Volume:  Normal  Mood:  Dysphoric  Affect:  Flat  Thought Process:  Goal Directed and Descriptions of Associations: Intact  Orientation:  Full (Time, Place, and Person)  Thought Content:  Delusions and Paranoid Ideation  Suicidal Thoughts:  No  Homicidal Thoughts:  No  Memory:  Immediate;   Fair Recent;   Fair Remote;   Fair  Judgement:  Poor  Insight:  Lacking  Psychomotor Activity:  Psychomotor Retardation  Concentration:  Concentration: Fair and Attention Span: Fair  Recall:  [employer] of Knowledge:  Fair  Language:  Fair  Akathisia:  No  Handed:  Right  AIMS (if indicated):     Assets:  Communication Skills Desire for Improvement Financial Resources/Insurance Housing Physical Health Resilience Social Support  ADL's:  Intact  Cognition:  WNL  Sleep:  Number of Hours: 8     Treatment Plan Summary: Daily contact with patient to assess and evaluate symptoms and progress in treatment and Medication management   Mr. Rolfe is a 52 year old male with a history of schizophrenia admitted floridly psychotic in the context of medication noncompliance.  #Psychosis -continueHaldol decanoate100 mgmonthly injections, next injection on 3/7 -continueDepakote500 mg BID. VPA level was 76 on 2/16 -discontinueHaldol 10 mg nightly -continue Clozapine 200 mg nightly tonight  #Toothabscess -Amoxicillin course completed  #Metabolic syndrome monitoring -Lipid panel, TSH  and HgbA1C were recently done -EKG, QTc 435  #Disposition -TBE -discharge to his apartment -followup with [facility] teamon outpatient commitment    Orson Slick, MD 07/30/2017, 1:21 PM

## 2017-07-31 MED ORDER — CLOZAPINE 25 MG PO TABS
250.0000 mg | ORAL_TABLET | Freq: Every day | ORAL | Status: DC
  Administered 2017-07-31: 250 mg via ORAL
  Filled 2017-07-31: qty 2

## 2017-07-31 NOTE — Progress Notes (Signed)
Received Benjamin Braun this AM in his room, he refused breakfast, but got up for lunch. He is not attending the group therapy sessions. He rated his depression and anxiety 2/10 and hopelessness 2/10.He is focus on going home and anxious to talk with the doctor.

## 2017-07-31 NOTE — Plan of Care (Addendum)
Patient found in bed upon my arrival. Patient is visible but remains isolated from peers. Denies pain. Denies SI, HI, AVH. Reports eating, sleeping, and voiding adequately. Patient is minimally verbal during assessment. Reports slight depression. Affect remains constricted. Compliant with HS medications and staff direction. Q 15 minute checks maintained. Will continue to monitor throughout the shift.  Patient slept 8 hours. No apparent distress. Will endorse care to oncoming shift.   Progressing Education: Knowledge of the prescribed therapeutic regimen will improve 07/31/2017 2211 - Progressing by Derek Mound, RN Coping: Ability to cope will improve 07/31/2017 2211 - Progressing by Derek Mound, RN Nutritional: Ability to achieve adequate nutritional intake will improve 07/31/2017 2211 - Progressing by Derek Mound, RN Self-Care: Ability to participate in self-care as condition permits will improve 07/31/2017 2211 - Progressing by Derek Mound, RN Self-Concept: Level of anxiety will decrease 07/31/2017 2211 - Progressing by Derek Mound, RN

## 2017-07-31 NOTE — Progress Notes (Signed)
[facility] MD Progress Note  07/31/2017 6:24 PM Benjamin Braun  MRN:  [id number]  Subjective:   [name] has no complaints. He wants to meet with Benjamin Braun representative tomorrow to establish care. He has no complaints. Surprisingly, he is out of his room and somewhat social. This may be a sign of budding mania.  Treatment plan. Continue depakote, Haldol decanoate injections and Clozapine titration, 250 mg tonight.  Social/disposition. Discharge to his apartment. Follow up TBE.  Principal Problem: Undifferentiated schizophrenia ([place]) Diagnosis:   Patient Active Problem List   Diagnosis Date Noted  . Undifferentiated schizophrenia ([place]) [F20.3] 11/04/2016    Priority: High  . Noncompliance with treatment [Z91.19] 04/14/2015   Total Time spent with patient: 20 minutes  Past Psychiatric History: schizophrenia  Past Medical History:  Past Medical History:  Diagnosis Date  . Anxiety   . Generalized anxiety disorder   . Schizophrenia [facility])     Past Surgical History:  Procedure Laterality Date  . TONSILLECTOMY     Family History:  Family History  Family history unknown: Yes   Family Psychiatric  History: none reported Social History:  Social History   Substance and Sexual Activity  Alcohol Use No  . Alcohol/week: 0.0 oz     Social History   Substance and Sexual Activity  Drug Use No    Social History   Socioeconomic History  . Marital status: Single    Spouse name: None  . Number of children: None  . Years of education: None  . Highest education level: None  Social Needs  . Financial resource strain: None  . Food insecurity - worry: None  . Food insecurity - inability: None  . Transportation needs - medical: None  . Transportation needs - non-medical: None  Occupational History  . None  Tobacco Use  . Smoking status: Former Smoker    Types: Cigarettes    Last attempt to quit: 06/11/1999    Years since quitting: 18.1  . Smokeless tobacco: Never Used   Substance and Sexual Activity  . Alcohol use: No    Alcohol/week: 0.0 oz  . Drug use: No  . Sexual activity: No  Other Topics Concern  . None  Social History Narrative  . None   Additional Social History:                         Sleep: Fair  Appetite:  Fair  Current Medications: Current Facility-Administered Medications  Medication Dose Route Frequency Provider Last Rate Last Dose  . acetaminophen (TYLENOL) tablet 650 mg  650 mg Oral Q6H PRN Clapacs, [name], MD   650 mg at 07/25/17 2135  . alum & mag hydroxide-simeth (MAALOX/MYLANTA) 200-200-20 MG/5ML suspension 30 mL  30 mL Oral Q4H PRN Clapacs, [name], MD      . cloZAPine (CLOZARIL) tablet 250 mg  250 mg Oral QHS Alexy Heldt B, MD      . divalproex (DEPAKOTE) DR tablet 500 mg  500 mg Oral Q12H Clapacs, [name], MD   500 mg at 07/31/17 0912  . [START ON 08/07/2017] haloperidol decanoate (HALDOL DECANOATE) 100 MG/ML injection 100 mg  100 mg Intramuscular Q21 days Trevionne Advani B, MD      . magnesium hydroxide (MILK OF MAGNESIA) suspension 30 mL  30 mL Oral Daily PRN Clapacs, [name], MD        Lab Results: No results found for this or any previous visit (from the past  48 hour(s)).  Blood Alcohol level:  Lab Results  Component Value Date   ETH <10 07/15/2017   ETH <10 14/97/0263    Metabolic Disorder Labs: Lab Results  Component Value Date   HGBA1C 5.4 06/19/2017   MPG 108.28 06/19/2017   MPG 111 11/05/2016   No results found for: PROLACTIN Lab Results  Component Value Date   CHOL 240 (H) 06/19/2017   TRIG 290 (H) 06/19/2017   HDL 36 (L) 06/19/2017   CHOLHDL 6.7 06/19/2017   VLDL 58 (H) 06/19/2017   LDLCALC 146 (H) 06/19/2017   LDLCALC 115 (H) 11/05/2016    Physical Findings: AIMS: Facial and Oral Movements Muscles of Facial Expression: None, normal Lips and Perioral Area: None, normal Jaw: None, normal Tongue: None, normal,Extremity Movements Upper (arms, wrists, hands, fingers):  None, normal Lower (legs, knees, ankles, toes): None, normal, Trunk Movements Neck, shoulders, hips: None, normal, Overall Severity Severity of abnormal movements (highest score from questions above): None, normal Incapacitation due to abnormal movements: None, normal Patient's awareness of abnormal movements (rate only patient's report): No Awareness, Dental Status Current problems with teeth and/or dentures?: Yes(Poor dentition) Does patient usually wear dentures?: No  CIWA:    COWS:     Musculoskeletal: Strength & Muscle Tone: within normal limits Gait & Station: normal Patient leans: N/A  Psychiatric Specialty Exam: Physical Exam  Nursing note and vitals reviewed. Psychiatric: His affect is blunt. His speech is slurred. He is withdrawn. Thought content is paranoid. Cognition and memory are normal. He expresses impulsivity.    Review of Systems  Neurological: Negative.   Psychiatric/Behavioral: Negative.   All other systems reviewed and are negative.   Blood pressure 111/75, pulse 94, temperature 97.6 F (36.4 C), temperature source Oral, resp. rate 18, height 5\' 6"  (1.676 m), weight 93.9 kg (207 lb), SpO2 96 %.Body mass index is 33.41 kg/m.  General Appearance: Casual  Eye Contact:  Good  Speech:  Clear and Coherent  Volume:  Normal  Mood:  Euthymic  Affect:  Flat  Thought Process:  Goal Directed and Descriptions of Associations: Intact  Orientation:  Full (Time, Place, and Person)  Thought Content:  Delusions and Paranoid Ideation  Suicidal Thoughts:  No  Homicidal Thoughts:  No  Memory:  Immediate;   Fair Recent;   Fair Remote;   Fair  Judgement:  Poor  Insight:  Lacking  Psychomotor Activity:  Normal  Concentration:  Concentration: Fair and Attention Span: Fair  Recall:  [employer] of Knowledge:  Fair  Language:  Fair  Akathisia:  No  Handed:  Right  AIMS (if indicated):     Assets:  Communication Skills Desire for Improvement Financial  Resources/Insurance Housing Physical Health Resilience Social Support  ADL's:  Intact  Cognition:  WNL  Sleep:  Number of Hours: 7.5     Treatment Plan Summary: Daily contact with patient to assess and evaluate symptoms and progress in treatment and Medication management   Benjamin Braun is a 52 year old male with a history of schizophrenia admitted floridly psychotic in the context of medication noncompliance.  #Psychosis -continueHaldol decanoate100 mgmonthly injections, next injection on 3/7 -continueDepakote500 mg BID. VPA level was 76 on 2/16 -discontinueHaldol 10 mg nightly -continue Clozapine titration 250 mg tonight  #Toothabscess -Amoxicillin course completed  #Metabolic syndrome monitoring -Lipid panel, TSH and HgbA1C were recently done -EKG, QTc 435  #Disposition -TBE -discharge to his apartment -followup with Benjamin Braun ACT teamon outpatient commitment      Orson Slick, MD  07/31/2017, 6:24 PM

## 2017-07-31 NOTE — Progress Notes (Signed)
Patient ID: Benjamin Braun, male   DOB: November 14, 1965, 52 y.o.   MRN: [id number] CSW followed up with Thressa Sheller, TL with [facility] ACTT. CSW informed Ms. Lee that [name], pt's psychiatrist on the [facility], would like for her to come and meet with pt to assess if she feels pt is ready to engage in ACT services and if she is willing to accept pt on the ACTT.  [name] informed CSW that the team's psychiatrist will be out of the office until Monday, February 25, 20`9 and she would like to discuss this with her before making a decision to accept pt back on the team.  The psychiatrist was very uncomfortable with pt making insensitive remarks about "[place] people" of which the psychiatrist is of [place] descent.  [name] did agree to come meet with pt on Friday, August 01, 2017 to assess his readiness to engage with the ACTT. CSW informed [name] that pt is a little better and has stated to his psychiatrist that he wishes to engage in ACT services.  He is very focused on being discharged from the hospital and going home at this time.

## 2017-07-31 NOTE — BHH Group Notes (Signed)
07/31/2017  Time: 1:00PM  Type of Therapy/Topic:  Group Therapy:  Balance in Life  Participation Level:  Did Not Attend  Description of Group:   This group will address the concept of balance and how it feels and looks when one is unbalanced. Patients will be encouraged to process areas in their lives that are out of balance and identify reasons for remaining unbalanced. Facilitators will guide patients in utilizing problem-solving interventions to address and correct the stressor making their life unbalanced. Understanding and applying boundaries will be explored and addressed for obtaining and maintaining a balanced life. Patients will be encouraged to explore ways to assertively make their unbalanced needs known to significant others in their lives, using other group members and facilitator for support and feedback.  Therapeutic Goals: 1. Patient will identify two or more emotions or situations they have that consume much of in their lives. 2. Patient will identify signs/triggers that life has become out of balance:  3. Patient will identify two ways to set boundaries in order to achieve balance in their lives:  4. Patient will demonstrate ability to communicate their needs through discussion and/or role plays  Summary of Patient Progress: Pt was invited to attend group but chose not to attend. CSW will continue to encourage pt to attend group throughout their admission.    Therapeutic Modalities:   Cognitive Behavioral Therapy Solution-Focused Therapy Assertiveness Training  Alden Hipp, MSW, LCSW 07/31/2017 1:49 PM

## 2017-07-31 NOTE — BHH Group Notes (Signed)
  07/31/2017 9am  Type of Therapy and Topic: Group Therapy: Goals Group: SMART Goals   Participation Level: Did Not Attend  Description of Group:    The purpose of a daily goals group is to assist and guide patients in setting recovery/wellness-related goals. The objective is to set goals as they relate to the crisis in which they were admitted. Patients will be using SMART goal modalities to set measurable goals. Characteristics of realistic goals will be discussed and patients will be assisted in setting and processing how one will reach their goal. Facilitator will also assist patients in applying interventions and coping skills learned in psycho-education groups to the SMART goal and process how one will achieve defined goal.   Therapeutic Goals:   -Patients will develop and document one goal related to or their crisis in which brought them into treatment.  -Patients will be guided by LCSW using SMART goal setting modality in how to set a measurable, attainable, realistic and time sensitive goal.  -Patients will process barriers in reaching goal.  -Patients will process interventions in how to overcome and successful in reaching goal.   Patient's Goal: Patient was encouraged and invited to attend group. Patient did not attend group. Social worker will continue to encourage group participation in the future.    Therapeutic Modalities:  Motivational Interviewing  Cognitive Behavioral Therapy  Crisis Intervention Model  SMART goals setting  Darin Engels, [place] 07/31/2017 9:34 AM

## 2017-08-01 LAB — CBC WITH DIFFERENTIAL/PLATELET
Basophils Absolute: 0 10*3/uL (ref 0–0.1)
Basophils Relative: 0 %
Eosinophils Absolute: 0.3 10*3/uL (ref 0–0.7)
Eosinophils Relative: 4 %
HEMATOCRIT: 43.9 % (ref 40.0–52.0)
HEMOGLOBIN: 15 g/dL (ref 13.0–18.0)
LYMPHS ABS: 2.7 10*3/uL (ref 1.0–3.6)
LYMPHS PCT: 35 %
MCH: 29.1 pg (ref 26.0–34.0)
MCHC: 34.1 g/dL (ref 32.0–36.0)
MCV: 85.3 fL (ref 80.0–100.0)
MONOS PCT: 10 %
Monocytes Absolute: 0.7 10*3/uL (ref 0.2–1.0)
NEUTROS ABS: 3.9 10*3/uL (ref 1.4–6.5)
NEUTROS PCT: 51 %
Platelets: 268 10*3/uL (ref 150–440)
RBC: 5.14 MIL/uL (ref 4.40–5.90)
RDW: 12.7 % (ref 11.5–14.5)
WBC: 7.7 10*3/uL (ref 3.8–10.6)

## 2017-08-01 MED ORDER — CLOZAPINE 100 MG PO TABS
300.0000 mg | ORAL_TABLET | Freq: Every day | ORAL | Status: DC
  Administered 2017-08-02 – 2017-08-03 (×2): 300 mg via ORAL
  Filled 2017-08-01 (×2): qty 3

## 2017-08-01 MED ORDER — CLOZAPINE 100 MG PO TABS
250.0000 mg | ORAL_TABLET | Freq: Every day | ORAL | Status: AC
  Administered 2017-08-01: 250 mg via ORAL
  Filled 2017-08-01: qty 10

## 2017-08-01 NOTE — Plan of Care (Signed)
Patient is visible but remains isolated from peers. Denies pain and SI/HI/AVH. Reports eating and voiding adequately. Patient is minimally verbal during assessment. Reports minimal depression.  Compliant with medications and staff direction. Q 15 minute checks maintained. Will continue to monitor throughout the shift.

## 2017-08-01 NOTE — Progress Notes (Signed)
Patient is visible but remains isolated from peers. Denies pain and SI/HI/AVH. Reports eating and voiding adequately. Patient is minimally verbal during assessment. Reports minimal depression.  Compliant with medications and staff direction. Q 15 minute checks maintained. Will continue to monitor throughout the shift.

## 2017-08-01 NOTE — Tx Team (Signed)
Interdisciplinary Treatment and Diagnostic Plan Update  07/28/2017 Time of Session: 10:40 AM Benjamin Braun MRN: [id number]  Principal Diagnosis: Undifferentiated schizophrenia [facility])  Secondary Diagnoses: Principal Problem:   Undifferentiated schizophrenia ([place]) Active Problems:   Noncompliance with treatment   Current Medications:  Current Facility-Administered Medications  Medication Dose Route Frequency Provider Last Rate Last Dose  . acetaminophen (TYLENOL) tablet 650 mg  650 mg Oral Q6H PRN Clapacs, [name], MD   650 mg at 07/25/17 2135  . alum & mag hydroxide-simeth (MAALOX/MYLANTA) 200-200-20 MG/5ML suspension 30 mL  30 mL Oral Q4H PRN Clapacs, [name], MD      . cloZAPine (CLOZARIL) tablet 250 mg  250 mg Oral QHS Pucilowska, Jolanta B, MD   250 mg at 07/31/17 2119  . divalproex (DEPAKOTE) DR tablet 500 mg  500 mg Oral Q12H Clapacs, [name], MD   500 mg at 08/01/17 9211  . [START ON 08/07/2017] haloperidol decanoate (HALDOL DECANOATE) 100 MG/ML injection 100 mg  100 mg Intramuscular Q21 days Pucilowska, Jolanta B, MD      . magnesium hydroxide (MILK OF MAGNESIA) suspension 30 mL  30 mL Oral Daily PRN Clapacs, [name], MD       PTA Medications: Medications Prior to Admission  Medication Sig Dispense Refill Last Dose  . divalproex (DEPAKOTE) 500 MG DR tablet Take 1 tablet (500 mg total) by mouth every 12 (twelve) hours. 60 tablet 1 07/16/2017 at 1051  . haloperidol decanoate (HALDOL DECANOATE) 100 MG/ML injection Inject 1 mL (100 mg total) into the muscle every 30 (thirty) days. Next injection on 12/09/2016. 1 mL 1 07/16/2017 at Unknown per pt.  Marland Kitchen OLANZapine (ZYPREXA) 15 MG tablet Take 1 tablet (15 mg total) by mouth at bedtime. 30 tablet 1 07/16/2017 at Unknown per pt.    Patient Stressors: Medication change or noncompliance  Patient Strengths: Ability for insight Average or above average intelligence General fund of knowledge Motivation for treatment/growth  Treatment Modalities:  Medication Management, Group therapy, Case management,  1 to 1 session with clinician, Psychoeducation, Recreational therapy.   Physician Treatment Plan for Primary Diagnosis: Undifferentiated schizophrenia ([place]) Long Term Goal(s): Improvement in symptoms so as ready for discharge NA   Short Term Goals: Ability to identify changes in lifestyle to reduce recurrence of condition will improve Ability to verbalize feelings will improve Ability to disclose and discuss suicidal ideas Ability to demonstrate self-control will improve Ability to identify and develop effective coping behaviors will improve Ability to maintain clinical measurements within normal limits will improve Compliance with prescribed medications will improve Ability to identify triggers associated with substance abuse/mental health issues will improve NA  Medication Management: Evaluate patient's response, side effects, and tolerance of medication regimen.  Therapeutic Interventions: 1 to 1 sessions, Unit Group sessions and Medication administration.  Evaluation of Outcomes: Progressing  Physician Treatment Plan for Secondary Diagnosis: Principal Problem:   Undifferentiated schizophrenia ([place]) Active Problems:   Noncompliance with treatment  Long Term Goal(s): Improvement in symptoms so as ready for discharge NA   Short Term Goals: Ability to identify changes in lifestyle to reduce recurrence of condition will improve Ability to verbalize feelings will improve Ability to disclose and discuss suicidal ideas Ability to demonstrate self-control will improve Ability to identify and develop effective coping behaviors will improve Ability to maintain clinical measurements within normal limits will improve Compliance with prescribed medications will improve Ability to identify triggers associated with substance abuse/mental health issues will improve NA     Medication  Management: Evaluate patient's response, side  effects, and tolerance of medication regimen.  Therapeutic Interventions: 1 to 1 sessions, Unit Group sessions and Medication administration.  Evaluation of Outcomes: Progressing   RN Treatment Plan for Primary Diagnosis: Undifferentiated schizophrenia ([place]) Long Term Goal(s): Knowledge of disease and therapeutic regimen to maintain health will improve  Short Term Goals: Ability to demonstrate self-control, Ability to identify and develop effective coping behaviors will improve and Compliance with prescribed medications will improve  Medication Management: RN will administer medications as ordered by provider, will assess and evaluate patient's response and provide education to patient for prescribed medication. RN will report any adverse and/or side effects to prescribing provider.  Therapeutic Interventions: 1 on 1 counseling sessions, Psychoeducation, Medication administration, Evaluate responses to treatment, Monitor vital signs and CBGs as ordered, Perform/monitor CIWA, COWS, AIMS and Fall Risk screenings as ordered, Perform wound care treatments as ordered.  Evaluation of Outcomes: Progressing   LCSW Treatment Plan for Primary Diagnosis: Undifferentiated schizophrenia ([place]) Long Term Goal(s): Safe transition to appropriate next level of care at discharge, Engage patient in therapeutic group addressing interpersonal concerns.  Short Term Goals: Engage patient in aftercare planning with referrals and resources and Increase skills for wellness and recovery  Therapeutic Interventions: Assess for all discharge needs, 1 to 1 time with Social worker, Explore available resources and support systems, Assess for adequacy in community support network, Educate family and significant other(s) on suicide prevention, Complete Psychosocial Assessment, Interpersonal group therapy.  Evaluation of Outcomes: Progressing   Progress in Treatment: Attending groups: No. Participating in groups:  No. Taking medication as prescribed: Yes. Toleration medication: Yes. Family/Significant other contact made: Yes, individual(s) contacted:  Pt's brother, Benjamin Braun has been contacted. Patient understands diagnosis: No. Discussing patient identified problems/goals with staff: Yes. Medical problems stabilized or resolved: Yes. Denies suicidal/homicidal ideation: Yes. Issues/concerns per patient self-inventory: No. Other: n/a  New problem(s) identified: No, Describe:  No new problems identified  New Short Term/Long Term Goal(s): Benjamin Braun has improved some regarding his delusions and hallucinations and has stated that he is ready to be discharged home.  He has expressed a desire to engage in ACT services.  Discharge Plan or Barriers: Benjamin Braun will discharge back to his home tentatively  with ACT services in place. [facility] ACT Team Leader, Thressa Sheller is scheduled to meet with him on 08/01/17 to assess his readiness to engage in services in the community.  Reason for Continuation of Hospitalization: Delusions  Hallucinations Medication stabilization  Estimated Length of Stay: 5-7 days  Attendees: Patient:   Physician: Orson Slick, MD 07/28/2017  Nursing: Eugenio Hoes, RN 07/28/2017  RN Care Manager:   Social Worker: Derrek Gu, LCSW 07/28/2017  Recreational Therapist:    Other:    Other:    Other:     Scribe for Treatment Team: Devona Konig, LCSW 08/01/2017 10:38 AM

## 2017-08-01 NOTE — BHH Group Notes (Signed)
LCSW Group Therapy Note  08/01/2017 1:00 pm  Type of Therapy and Topic:  Group Therapy:  Feelings around Relapse and Recovery  Participation Level:  Did Not Attend   Description of Group:    Patients in this group will discuss emotions they experience before and after a relapse. They will process how experiencing these feelings, or avoidance of experiencing them, relates to having a relapse. Facilitator will guide patients to explore emotions they have related to recovery. Patients will be encouraged to process which emotions are more powerful. They will be guided to discuss the emotional reaction significant others in their lives may have to their relapse or recovery. Patients will be assisted in exploring ways to respond to the emotions of others without this contributing to a relapse.  Therapeutic Goals: 1. Patient will identify two or more emotions that lead to a relapse for them 2. Patient will identify two emotions that result when they relapse 3. Patient will identify two emotions related to recovery 4. Patient will demonstrate ability to communicate their needs through discussion and/or role plays   Summary of Patient Progress:     Therapeutic Modalities:   Cognitive Behavioral Therapy Solution-Focused Therapy Assertiveness Training Relapse Prevention Therapy   Devona Konig, LCSW 08/01/2017 2:32 PM

## 2017-08-01 NOTE — Progress Notes (Signed)
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  Today is Benjamin Braun's birthday. He is sad to be in the hospital. He has no complaints and denies any symptoms. He has been waiting for [employer] representative today but [name] could not come. They may not be a good match. Sleep and appetite are fair. There is no excessive sedation from Clozapine. The patient is slightly more interactive with peers but mostly stays to himself.   Treatment plan. We continue Haldol decanoate injection, next at the beginning of March, Depakote and Clozapine titration. Increase dose of Clozapine tomorrow to 300 mg.  Social/disposition. He will return to his apartment one more time. Needs ACT team. We will contact [employer].  Principal Problem: Undifferentiated schizophrenia ([place]) Diagnosis:   Patient Active Problem List   Diagnosis Date Noted  . Undifferentiated schizophrenia ([place]) [F20.3] 11/04/2016    Priority: High  . Noncompliance with treatment [Z91.19] 04/14/2015   Total Time spent with patient: 20 minutes  Past Psychiatric History: schizophrenia  Past Medical History:  Past Medical History:  Diagnosis Date  . Anxiety   . Generalized anxiety disorder   . Schizophrenia [facility])     Past Surgical History:  Procedure Laterality Date  . TONSILLECTOMY     Family History:  Family History  Family history unknown: Yes   Family Psychiatric  History: none reported Social History:  Social History   Substance and Sexual Activity  Alcohol Use No  . Alcohol/week: 0.0 oz     Social History   Substance and Sexual Activity  Drug Use No    Social History   Socioeconomic History  . Marital status: Single    Spouse name: None  . Number of children: None  . Years of education: None  . Highest education level:  None  Social Needs  . Financial resource strain: None  . Food insecurity - worry: None  . Food insecurity - inability: None  . Transportation needs - medical: None  . Transportation needs - non-medical: None  Occupational History  . None  Tobacco Use  . Smoking status: Former Smoker    Types: Cigarettes    Last attempt to quit: 06/11/1999    Years since quitting: 18.1  . Smokeless tobacco: Never Used  Substance and Sexual Activity  . Alcohol use: No    Alcohol/week: 0.0 oz  . Drug use: No  . Sexual activity: No  Other Topics Concern  . None  Social History Narrative  . None   Additional Social History:                         Sleep: Fair  Appetite:  Fair  Current Medications: Current Facility-Administered Medications  Medication Dose Route Frequency Provider Last Rate Last Dose  . acetaminophen (TYLENOL) tablet 650 mg  650 mg Oral Q6H PRN Clapacs, [name], MD   650 mg at 07/25/17 2135  . alum & mag hydroxide-simeth (MAALOX/MYLANTA) 200-200-20 MG/5ML suspension 30 mL  30 mL Oral Q4H PRN Clapacs, [name], MD      . cloZAPine (CLOZARIL) tablet 250 mg  250 mg Oral QHS Pucilowska, Jolanta B, MD   250 mg at 07/31/17 2119  . divalproex (DEPAKOTE) DR tablet 500 mg  500 mg Oral Q12H Clapacs, [name], MD  500 mg at 08/01/17 3810  . [START ON 08/07/2017] haloperidol decanoate (HALDOL DECANOATE) 100 MG/ML injection 100 mg  100 mg Intramuscular Q21 days Pucilowska, Jolanta B, MD      . magnesium hydroxide (MILK OF MAGNESIA) suspension 30 mL  30 mL Oral Daily PRN Clapacs, [name], MD        Lab Results:  Results for orders placed or performed during the hospital encounter of 07/16/17 (from the past 48 hour(s))  CBC with Differential/Platelet     Status: None   Collection Time: 08/01/17  7:48 AM  Result Value Ref Range   WBC 7.7 3.8 - 10.6 K/uL   RBC 5.14 4.40 - 5.90 MIL/uL   Hemoglobin 15.0 13.0 - 18.0 g/dL   HCT 43.9 40.0 - 52.0 %   MCV 85.3 80.0 - 100.0 fL   MCH 29.1 26.0  - 34.0 pg   MCHC 34.1 32.0 - 36.0 g/dL   RDW 12.7 11.5 - 14.5 %   Platelets 268 150 - 440 K/uL   Neutrophils Relative % 51 %   Neutro Abs 3.9 1.4 - 6.5 K/uL   Lymphocytes Relative 35 %   Lymphs Abs 2.7 1.0 - 3.6 K/uL   Monocytes Relative 10 %   Monocytes Absolute 0.7 0.2 - 1.0 K/uL   Eosinophils Relative 4 %   Eosinophils Absolute 0.3 0 - 0.7 K/uL   Basophils Relative 0 %   Basophils Absolute 0.0 0 - 0.1 K/uL    Comment: Performed at [facility], [address] [name], [name] [address]    Blood Alcohol level:  Lab Results  Component Value Date   [facility] <10 07/15/2017   ETH <10 25/85/2778    Metabolic Disorder Labs: Lab Results  Component Value Date   HGBA1C 5.4 06/19/2017   MPG 108.28 06/19/2017   MPG 111 11/05/2016   No results found for: PROLACTIN Lab Results  Component Value Date   CHOL 240 (H) 06/19/2017   TRIG 290 (H) 06/19/2017   HDL 36 (L) 06/19/2017   CHOLHDL 6.7 06/19/2017   VLDL 58 (H) 06/19/2017   LDLCALC 146 (H) 06/19/2017   LDLCALC 115 (H) 11/05/2016    Physical Findings: AIMS: Facial and Oral Movements Muscles of Facial Expression: None, normal Lips and Perioral Area: None, normal Jaw: None, normal Tongue: None, normal,Extremity Movements Upper (arms, wrists, hands, fingers): None, normal Lower (legs, knees, ankles, toes): None, normal, Trunk Movements Neck, shoulders, hips: None, normal, Overall Severity Severity of abnormal movements (highest score from questions above): None, normal Incapacitation due to abnormal movements: None, normal Patient's awareness of abnormal movements (rate only patient's report): No Awareness, Dental Status Current problems with teeth and/or dentures?: Yes(Poor dentition) Does patient usually wear dentures?: No  CIWA:    COWS:     Musculoskeletal: Strength & Muscle Tone: within normal limits Gait & Station: normal Patient leans: N/A  Psychiatric Specialty Exam: Physical Exam  Nursing note and  vitals reviewed. Psychiatric: His affect is blunt. His speech is slurred. He is withdrawn. Thought content is paranoid. Cognition and memory are normal. He expresses impulsivity.    Review of Systems  Neurological: Negative.   Psychiatric/Behavioral: Negative.   All other systems reviewed and are negative.   Blood pressure 104/68, pulse 94, temperature 97.6 F (36.4 C), temperature source Oral, resp. rate 18, height 5\' 6"  (1.676 m), weight 93.9 kg (207 lb), SpO2 96 %.Body mass index is 33.41 kg/m.  General Appearance: Casual  Eye Contact:  Good  Speech:  Slurred  Volume:  Increased  Mood:  Anxious  Affect:  Flat  Thought Process:  Goal Directed and Descriptions of Associations: Intact  Orientation:  Full (Time, Place, and Person)  Thought Content:  Delusions and Paranoid Ideation  Suicidal Thoughts:  No  Homicidal Thoughts:  No  Memory:  Immediate;   Fair Recent;   Fair Remote;   Fair  Judgement:  Poor  Insight:  Lacking  Psychomotor Activity:  Decreased  Concentration:  Concentration: Fair and Attention Span: Fair  Recall:  [employer] of Knowledge:  Fair  Language:  Fair  Akathisia:  No  Handed:  Right  AIMS (if indicated):     Assets:  Communication Skills Desire for Improvement Financial Resources/Insurance Housing Physical Health Resilience Social Support  ADL's:  Intact  Cognition:  WNL  Sleep:  Number of Hours: 8     Treatment Plan Summary: Daily contact with patient to assess and evaluate symptoms and progress in treatment and Medication management   Mr. Fluharty is a 52 year old male with a history of schizophrenia admitted floridly psychotic in the context of medication noncompliance.  #Psychosis -continueHaldol decanoate100 mgmonthly injections, next injection on 3/7 -continueDepakote500 mg BID. VPA level was 76 on 2/16 -discontinueHaldol 10 mg nightly -continue Clozapine titration 300 mg on 2/23  #Toothabscess -Amoxicillincourse  completed  #Metabolic syndrome monitoring -Lipid panel, TSH and HgbA1C were recently done -EKG, QTc 435  #Disposition -discharge to his apartment -followup with [facility] ACT teamon outpatient commitment      Orson Slick, MD 08/01/2017, 12:26 PM

## 2017-08-01 NOTE — Consult Note (Signed)
MEDICATION RELATED CONSULT NOTE - INITIAL   Pharmacy Consult for Clozapine labs monitoring and REMs program reporting   Patient Measurements: Height: 5\' 6"  (167.6 cm) Weight: 207 lb (93.9 kg) IBW/kg (Calculated) : 63.8  Vital Signs: Temp: 97.6 F (36.4 C) (02/22 0605) Temp Source: Oral (02/22 0605) BP: 104/68 (02/22 0605) Pulse Rate: 94 (02/22 0605) Intake/Output from previous day: 02/21 0701 - 02/22 0700 In: 960 [P.O.:960] Out: -  Intake/Output from this shift: Total I/O In: 240 [P.O.:240] Out: -   Labs: Recent Labs    08/01/17 0748  WBC 7.7  HGB 15.0  HCT 43.9  PLT 268   Estimated Creatinine Clearance: 85.4 mL/min (by C-G formula based on SCr of 1.06 mg/dL).  Medical History: Past Medical History:  Diagnosis Date  . Anxiety   . Generalized anxiety disorder   . Schizophrenia [facility])     Assessment: Pharmacy consulted for clozapine lab monitoring and REMS Reporting in 52 yo male with PMH of schizophrenia.   Plan: Monitor ANC weekly  2/16  [place] 4200.  2/22  [facility] 3900   Ceyda Peterka K, RPh  08/01/2017 9:43 AM

## 2017-08-02 NOTE — Plan of Care (Signed)
  Progressing Education: Will be free of psychotic symptoms 08/02/2017 1404 - Progressing by Jacinto Halim, RN Knowledge of the prescribed therapeutic regimen will improve 08/02/2017 1404 - Progressing by Jacinto Halim, RN Coping: Ability to cope will improve 08/02/2017 1404 - Progressing by Jacinto Halim, RN Safety: Ability to redirect hostility and anger into socially appropriate behaviors will improve 08/02/2017 1404 - Progressing by Jacinto Halim, RN Ability to remain free from injury will improve 08/02/2017 1404 - Progressing by Jacinto Halim, RN

## 2017-08-02 NOTE — Plan of Care (Signed)
Pt still isolative to room. Pleasant upon approach. Pt becomes confused and has to be redirect to his room. No issues noted/reported thus far this shift. Will cont to monitor pt.  Progressing Activity: Will verbalize the importance of balancing activity with adequate rest periods 08/02/2017 0008 - Progressing by Corinna Capra, RN Education: Will be free of psychotic symptoms 08/02/2017 0008 - Progressing by Corinna Capra, RN Knowledge of the prescribed therapeutic regimen will improve 08/02/2017 0008 - Progressing by Corinna Capra, RN Coping: Ability to cope will improve 08/02/2017 0008 - Progressing by Corinna Capra, RN Ability to verbalize feelings will improve 08/02/2017 0008 - Progressing by Corinna Capra, RN Nutritional: Ability to achieve adequate nutritional intake will improve 08/02/2017 0008 - Progressing by Corinna Capra, RN Safety: Ability to redirect hostility and anger into socially appropriate behaviors will improve 08/02/2017 0008 - Progressing by Corinna Capra, RN Ability to remain free from injury will improve 08/02/2017 0008 - Progressing by Corinna Capra, RN Self-Care: Ability to participate in self-care as condition permits will improve 08/02/2017 0008 - Progressing by Corinna Capra, RN Self-Concept: Ability to verbalize positive feelings about self will improve 08/02/2017 0008 - Progressing by Corinna Capra, RN Education: Ability to state activities that reduce stress will improve 08/02/2017 0008 - Progressing by Corinna Capra, RN Self-Concept: Level of anxiety will decrease 08/02/2017 0008 - Progressing by Corinna Capra, RN Ability to modify response to factors that promote anxiety will improve 08/02/2017 0008 - Progressing by Corinna Capra, RN Education: Knowledge of [place] Education information/materials will improve 08/02/2017 0008 - Progressing by Corinna Capra, RN

## 2017-08-02 NOTE — Progress Notes (Signed)
Patient pleasant and cooperative. Isolative to self and room. Sleeps in bed most of day. Minimal interaction with staff and peers.  Eating meals and compliant with meds. Encouraged to stay out of bed and go to group. Patient continues to stay in room in bed.

## 2017-08-02 NOTE — Progress Notes (Signed)
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  Patient sedated all day.  Found to be sleeping in his room.  Nursing staff endorsed that he did come to take medications but then went back and went back to sleep.  Treatment plan. We continue Haldol decanoate injection, next at the beginning of March, Depakote and Clozapine titration. Increase dose of Clozapine tomorrow to 300 mg.  Social/disposition. He will return to his apartment one more time. Needs ACT team. We will contact [employer].  Principal Problem: Undifferentiated schizophrenia ([place]) Diagnosis:   Patient Active Problem List   Diagnosis Date Noted  . Undifferentiated schizophrenia ([place]) [F20.3] 11/04/2016  . Noncompliance with treatment [Z91.19] 04/14/2015   Total Time spent with patient: 15 minutes  Past Psychiatric History: schizophrenia  Past Medical History:  Past Medical History:  Diagnosis Date  . Anxiety   . Generalized anxiety disorder   . Schizophrenia [facility])     Past Surgical History:  Procedure Laterality Date  . TONSILLECTOMY     Family History:  Family History  Family history unknown: Yes   Family Psychiatric  History: none reported Social History:  Social History   Substance and Sexual Activity  Alcohol Use No  . Alcohol/week: 0.0 oz     Social History   Substance and Sexual Activity  Drug Use No    Social History   Socioeconomic History  . Marital status: Single    Spouse name: None  . Number of children: None  . Years of education: None  . Highest education level: None  Social Needs  . Financial resource strain: None  . Food insecurity - worry: None  . Food insecurity - inability: None  . Transportation needs - medical: None  . Transportation needs - non-medical: None  Occupational  History  . None  Tobacco Use  . Smoking status: Former Smoker    Types: Cigarettes    Last attempt to quit: 06/11/1999    Years since quitting: 18.1  . Smokeless tobacco: Never Used  Substance and Sexual Activity  . Alcohol use: No    Alcohol/week: 0.0 oz  . Drug use: No  . Sexual activity: No  Other Topics Concern  . None  Social History Narrative  . None   Additional Social History:                         Sleep: Fair  Appetite:  Fair  Current Medications: Current Facility-Administered Medications  Medication Dose Route Frequency Provider Last Rate Last Dose  . acetaminophen (TYLENOL) tablet 650 mg  650 mg Oral Q6H PRN Clapacs, [name], MD   650 mg at 07/25/17 2135  . alum & mag hydroxide-simeth (MAALOX/MYLANTA) 200-200-20 MG/5ML suspension 30 mL  30 mL Oral Q4H PRN Clapacs, [name], MD      . cloZAPine (CLOZARIL) tablet 300 mg  300 mg Oral QHS Pucilowska, Jolanta B, MD      . divalproex (DEPAKOTE) DR tablet 500 mg  500 mg Oral Q12H Clapacs, [name], MD   500 mg at 08/02/17 0753  . [START ON 08/07/2017] haloperidol decanoate (HALDOL DECANOATE) 100 MG/ML injection 100 mg  100 mg Intramuscular Q21 days Pucilowska, Jolanta B, MD      . magnesium hydroxide (MILK OF MAGNESIA) suspension 30  mL  30 mL Oral Daily PRN Clapacs, [name], MD        Lab Results:  Results for orders placed or performed during the hospital encounter of 07/16/17 (from the past 48 hour(s))  CBC with Differential/Platelet     Status: None   Collection Time: 08/01/17  7:48 AM  Result Value Ref Range   WBC 7.7 3.8 - 10.6 K/uL   RBC 5.14 4.40 - 5.90 MIL/uL   Hemoglobin 15.0 13.0 - 18.0 g/dL   HCT 43.9 40.0 - 52.0 %   MCV 85.3 80.0 - 100.0 fL   MCH 29.1 26.0 - 34.0 pg   MCHC 34.1 32.0 - 36.0 g/dL   RDW 12.7 11.5 - 14.5 %   Platelets 268 150 - 440 K/uL   Neutrophils Relative % 51 %   Neutro Abs 3.9 1.4 - 6.5 K/uL   Lymphocytes Relative 35 %   Lymphs Abs 2.7 1.0 - 3.6 K/uL   Monocytes Relative 10 %    Monocytes Absolute 0.7 0.2 - 1.0 K/uL   Eosinophils Relative 4 %   Eosinophils Absolute 0.3 0 - 0.7 K/uL   Basophils Relative 0 %   Basophils Absolute 0.0 0 - 0.1 K/uL    Comment: Performed at [facility], [address] [place], [place] [address]    Blood Alcohol level:  Lab Results  Component Value Date   [facility] <10 07/15/2017   ETH <10 94/49/6759    Metabolic Disorder Labs: Lab Results  Component Value Date   HGBA1C 5.4 06/19/2017   MPG 108.28 06/19/2017   MPG 111 11/05/2016   No results found for: PROLACTIN Lab Results  Component Value Date   CHOL 240 (H) 06/19/2017   TRIG 290 (H) 06/19/2017   HDL 36 (L) 06/19/2017   CHOLHDL 6.7 06/19/2017   VLDL 58 (H) 06/19/2017   LDLCALC 146 (H) 06/19/2017   LDLCALC 115 (H) 11/05/2016    Physical Findings: AIMS: Facial and Oral Movements Muscles of Facial Expression: None, normal Lips and Perioral Area: None, normal Jaw: None, normal Tongue: None, normal,Extremity Movements Upper (arms, wrists, hands, fingers): None, normal Lower (legs, knees, ankles, toes): None, normal, Trunk Movements Neck, shoulders, hips: None, normal, Overall Severity Severity of abnormal movements (highest score from questions above): None, normal Incapacitation due to abnormal movements: None, normal Patient's awareness of abnormal movements (rate only patient's report): No Awareness, Dental Status Current problems with teeth and/or dentures?: Yes(Poor dentition) Does patient usually wear dentures?: No  CIWA:    COWS:     Musculoskeletal: Strength & Muscle Tone: within normal limits Gait & Station: normal Patient leans: N/A  Psychiatric Specialty Exam: Physical Exam  Nursing note and vitals reviewed. Psychiatric: His affect is blunt. His speech is slurred. He is withdrawn. Thought content is paranoid. Cognition and memory are normal. He expresses impulsivity.    Review of Systems  Neurological: Negative.    Psychiatric/Behavioral: Negative.   All other systems reviewed and are negative.   Blood pressure 104/68, pulse 94, temperature 97.6 F (36.4 C), temperature source Oral, resp. rate 18, height 5\' 6"  (1.676 m), weight 207 lb (93.9 kg), SpO2 96 %.Body mass index is 33.41 kg/m.  General Appearance: Casual  Eye Contact:  Good  Speech:  Slurred  Volume:  Increased  Mood:  Anxious  Affect:  Flat  Thought Process:  Goal Directed and Descriptions of Associations: Intact  Orientation:  Full (Time, Place, and Person)  Thought Content:  Delusions and Paranoid Ideation  Suicidal Thoughts:  No  Homicidal Thoughts:  No  Memory:  Immediate;   Fair Recent;   Fair Remote;   Fair  Judgement:  Poor  Insight:  Lacking  Psychomotor Activity:  Decreased  Concentration:  Concentration: Fair and Attention Span: Fair  Recall:  [employer] of Knowledge:  Fair  Language:  Fair  Akathisia:  No  Handed:  Right  AIMS (if indicated):     Assets:  Communication Skills Desire for Improvement Financial Resources/Insurance Housing Physical Health Resilience Social Support  ADL's:  Intact  Cognition:  WNL  Sleep:  Number of Hours: 7.3     Treatment Plan Summary: Daily contact with patient to assess and evaluate symptoms and progress in treatment and Medication management   Benjamin Braun is a 52 year old male with a history of schizophrenia admitted floridly psychotic in the context of medication noncompliance.  #Psychosis -continueHaldol decanoate100 mgmonthly injections, next injection on 3/7 -continueDepakote500 mg BID. VPA level was 76 on 2/16 -discontinueHaldol 10 mg nightly -continue Clozapine titration 300 mg on 2/23  #Toothabscess -Amoxicillincourse completed  #Metabolic syndrome monitoring -Lipid panel, TSH and HgbA1C were recently done -EKG, QTc 435  #Disposition -discharge to his apartment -followup with [facility] ACT teamon outpatient  commitment      Ramond Dial, MD 08/02/2017, 4:35 PM

## 2017-08-02 NOTE — [facility] Group Notes (Signed)

## 2017-08-03 NOTE — [facility] Group Notes (Signed)
LCSW Group Therapy Note 08/03/2017 1:15pm  Type of Therapy and Topic: Group Therapy: Feelings Around Returning Home & Establishing a Supportive Framework and Supporting Oneself When Supports Not Available  Participation Level: Did Not Attend  Description of Group:  Patients first processed thoughts and feelings about upcoming discharge. These included fears of upcoming changes, lack of change, new living environments, judgements and expectations from others and overall stigma of mental health issues. The group then discussed the definition of a supportive framework, what that looks and feels like, and how do to discern it from an unhealthy non-supportive network. The group identified different types of supports as well as what to do when your family/friends are less than helpful or unavailable  Therapeutic Goals  1. Patient will identify one healthy supportive network that they can use at discharge. 2. Patient will identify one factor of a supportive framework and how to tell it from an unhealthy network. 3. Patient able to identify one coping skill to use when they do not have positive supports from others. 4. Patient will demonstrate ability to communicate their needs through discussion and/or role plays.  Summary of Patient Progress:  Pt was invited to attend group but did not attend.   Therapeutic Modalities Cognitive Behavioral Therapy Motivational Interviewing   Lucca Ballo  CUEBAS-COLON, LCSW 08/03/2017 10:09 AM

## 2017-08-03 NOTE — Plan of Care (Signed)
  Progressing Education: Will be free of psychotic symptoms 08/03/2017 1352 - Progressing by Jacinto Halim, RN   Progressing Education: Will be free of psychotic symptoms 08/03/2017 1352 - Progressing by Jacinto Halim, RN   Progressing Education: Will be free of psychotic symptoms 08/03/2017 1352 - Progressing by Jacinto Halim, RN   Progressing Education: Will be free of psychotic symptoms 08/03/2017 1352 - Progressing by Jacinto Halim, RN

## 2017-08-03 NOTE — Progress Notes (Signed)
Patient isolates to self and room. Comes out in afternoon for short periods. Minimal interaction with staff and peers. Sleeps most of day.  Encouragement and support offered. Safety checks maintained. Pt receptive and remains safe on unit with q 15 min checks.

## 2017-08-03 NOTE — Progress Notes (Signed)
Patient has been in bed sleeping. RN tried to wake him up for medications but pt would not wake up. RN will continue to try.

## 2017-08-03 NOTE — Progress Notes (Signed)
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  Patient continues to be sedated this morning.  Nursing staff noticed that he was able to have his food yesterday night, but then went back to bed after that.  This morning patient continues to be sedated and not arousable.  It is highly possible that the dose elevation of clozapine is responsible for the sedation.  We will continue to monitor patient and see if I can talk to him later this afternoon.  Treatment plan. We continue Haldol decanoate injection, next at the beginning of March, Depakote and Clozapine titration. Increase dose of Clozapine tomorrow to 300 mg.  Social/disposition. He will return to his apartment one more time. Needs ACT team. We will contact [employer].  Principal Problem: Undifferentiated schizophrenia ([place]) Diagnosis:   Patient Active Problem List   Diagnosis Date Noted  . 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 Noncompliance with treatment [Z91.19] 04/14/2015   Total Time spent with patient: 15 minutes  Past Psychiatric History: schizophrenia  Past Medical History:  Past Medical History:  Diagnosis Date  . Anxiety   . Generalized anxiety disorder   . Schizophrenia [facility])     Past Surgical History:  Procedure Laterality Date  . TONSILLECTOMY     Family History:  Family History  Family history unknown: Yes   Family Psychiatric  History: none reported Social History:  Social History   Substance and Sexual Activity  Alcohol Use No  . Alcohol/week: 0.0 oz     Social History   Substance and Sexual Activity  Drug Use No    Social History   Socioeconomic History  . Marital status: Single    Spouse name: None  . Number of children: None  . Years of education: None  . Highest education  level: None  Social Needs  . Financial resource strain: None  . Food insecurity - worry: None  . Food insecurity - inability: None  . Transportation needs - medical: None  . Transportation needs - non-medical: None  Occupational History  . None  Tobacco Use  . Smoking status: Former Smoker    Types: Cigarettes    Last attempt to quit: 06/11/1999    Years since quitting: 18.1  . Smokeless tobacco: Never Used  Substance and Sexual Activity  . Alcohol use: No    Alcohol/week: 0.0 oz  . Drug use: No  . Sexual activity: No  Other Topics Concern  . None  Social History Narrative  . None   Additional Social History:                         Sleep: Fair  Appetite:  Fair  Current Medications: Current Facility-Administered Medications  Medication Dose Route Frequency Provider Last Rate Last Dose  . acetaminophen (TYLENOL) tablet 650 mg  650 mg Oral Q6H PRN Clapacs, [name], MD   650 mg at 07/25/17 2135  . alum & mag hydroxide-simeth (MAALOX/MYLANTA) 200-200-20 MG/5ML suspension 30 mL  30 mL Oral Q4H PRN Clapacs, [name], MD      . cloZAPine (CLOZARIL) tablet 300 mg  300 mg Oral QHS Pucilowska, Jolanta B, MD   300 mg at 08/02/17 2133  . divalproex (DEPAKOTE) DR tablet 500 mg  500 mg Oral Q12H Clapacs, [name],  MD   500 mg at 08/03/17 0757  . [START ON 08/07/2017] haloperidol decanoate (HALDOL DECANOATE) 100 MG/ML injection 100 mg  100 mg Intramuscular Q21 days Pucilowska, Jolanta B, MD      . magnesium hydroxide (MILK OF MAGNESIA) suspension 30 mL  30 mL Oral Daily PRN Clapacs, [name], MD        Lab Results:  No results found for this or any previous visit (from the past 48 hour(s)).  Blood Alcohol level:  Lab Results  Component Value Date   ETH <10 07/15/2017   ETH <10 76/72/0947    Metabolic Disorder Labs: Lab Results  Component Value Date   HGBA1C 5.4 06/19/2017   MPG 108.28 06/19/2017   MPG 111 11/05/2016   No results found for: PROLACTIN Lab Results   Component Value Date   CHOL 240 (H) 06/19/2017   TRIG 290 (H) 06/19/2017   HDL 36 (L) 06/19/2017   CHOLHDL 6.7 06/19/2017   VLDL 58 (H) 06/19/2017   LDLCALC 146 (H) 06/19/2017   LDLCALC 115 (H) 11/05/2016    Physical Findings: AIMS: Facial and Oral Movements Muscles of Facial Expression: None, normal Lips and Perioral Area: None, normal Jaw: None, normal Tongue: None, normal,Extremity Movements Upper (arms, wrists, hands, fingers): None, normal Lower (legs, knees, ankles, toes): None, normal, Trunk Movements Neck, shoulders, hips: None, normal, Overall Severity Severity of abnormal movements (highest score from questions above): None, normal Incapacitation due to abnormal movements: None, normal Patient's awareness of abnormal movements (rate only patient's report): No Awareness, Dental Status Current problems with teeth and/or dentures?: Yes(Poor dentition) Does patient usually wear dentures?: No  CIWA:    COWS:     Musculoskeletal: Strength & Muscle Tone: within normal limits Gait & Station: normal Patient leans: N/A  Psychiatric Specialty Exam: Physical Exam  Nursing note and vitals reviewed. Psychiatric: His affect is blunt. His speech is slurred. He is withdrawn. Thought content is paranoid. Cognition and memory are normal. He expresses impulsivity.    Review of Systems  Neurological: Negative.   Psychiatric/Behavioral: Negative.   All other systems reviewed and are negative.   Blood pressure 104/68, pulse 94, temperature 97.6 F (36.4 C), temperature source Oral, resp. rate 18, height 5\' 6"  (1.676 m), weight 207 lb (93.9 kg), SpO2 96 %.Body mass index is 33.41 kg/m.  General Appearance: Casual  Eye Contact:  Good  Speech:  Slurred  Volume:  Increased  Mood:  Anxious  Affect:  Flat  Thought Process:  Goal Directed and Descriptions of Associations: Intact  Orientation:  Full (Time, Place, and Person)  Thought Content:  Delusions and Paranoid Ideation   Suicidal Thoughts:  No  Homicidal Thoughts:  No  Memory:  Immediate;   Fair Recent;   Fair Remote;   Fair  Judgement:  Poor  Insight:  Lacking  Psychomotor Activity:  Decreased  Concentration:  Concentration: Fair and Attention Span: Fair  Recall:  [employer] of Knowledge:  Fair  Language:  Fair  Akathisia:  No  Handed:  Right  AIMS (if indicated):     Assets:  Communication Skills Desire for Improvement Financial Resources/Insurance Housing Physical Health Resilience Social Support  ADL's:  Intact  Cognition:  WNL  Sleep:  Number of Hours: 7.45     Treatment Plan Summary: Daily contact with patient to assess and evaluate symptoms and progress in treatment and Medication management   Benjamin Braun is a 52 year old male with a history of schizophrenia admitted floridly psychotic in the  context of medication noncompliance.  #Psychosis -continueHaldol decanoate100 mgmonthly injections, next injection on 3/7 -continueDepakote500 mg BID. VPA level was 76 on 2/16 -For now we will keep clozapine at 300 mg.  It is highly possible that patient's sedation will decrease in severity over the course of the next 2-3 days.  If it does not then we can back down on the clozapine dose to 250 mg.  #Toothabscess -Amoxicillincourse completed  #Metabolic syndrome monitoring -Lipid panel, TSH and HgbA1C were recently done -EKG, QTc 435  #Disposition -discharge to his apartment -followup with [facility] ACT teamon outpatient commitment      Ramond Dial, MD 08/03/2017, 10:28 AM

## 2017-08-04 MED ORDER — CLOZAPINE 25 MG PO TABS
250.0000 mg | ORAL_TABLET | Freq: Every day | ORAL | Status: DC
  Administered 2017-08-04: 250 mg via ORAL
  Filled 2017-08-04 (×2): qty 2

## 2017-08-04 NOTE — Plan of Care (Addendum)
Patient found asleep in bed upon my arrival. Patient remains in bed throughout the evening. Declined to attend group and refused snack. Patient reports general fatigue and malaise. "I'm very tired." Patient is minimally verbal throughout encounter. Denies SI, HI, AVH. Reports eating and voiding adequately. Compliant with HS medications and staff direction. Q 15 minute checks maintained. Will continue to monitor throughout the shift. Patient slept entire shift. Woke only for assessment/HS medications. No apparent distress. Will endorse care to oncoming shift.

## 2017-08-04 NOTE — Progress Notes (Signed)
Patient was in bed sleeping at the beginning of this shift. Remained in bed sleeping, unable to participate in schedules activities. Patient refused to come to the medication room: medications were taken to him in room. Patient was able to talk to this Probation officer. Continues to experience hearing impairment in left ear. Patient denies thoughts of self harm. Patient returned to sleep after medications. Staff continue to monitor.

## 2017-08-04 NOTE — Plan of Care (Signed)
Patient denies SI,HI and AVH.Sleeping most of the shift.Minimal interactions with staff & peers.Appetite good.Outside in the courtyard for short period of time.Compliant with medications.Support and encouragement given.

## 2017-08-04 NOTE — Plan of Care (Signed)
Stayed in bed sleeping. No major concern. Taking medications

## 2017-08-04 NOTE — Progress Notes (Signed)
[facility] MD Progress Note  08/04/2017 6:24 PM Bow Buntyn  MRN:  [id number]  Subjective:    Mr. Hufford is again sedated from clozapine and sleeps most of the time. He is still paranoid about is neighbors and the police which brought him to the hospital in the first place. He has a place to go but not follow up as he has been notoriously noncompliant with treatment.  Treatment pla. Continue Haldol injections, next on 2 /28, continue Depakote and lower Clozapine to 250 mg tonight. Clozapine, VPA and ammonia in am.  Social/disposition. He will likely return to his apartment but he has been doing badly lately. ACT team still did not give [place] the answer if they are willing to work with them. It is unlikely as the patient has been using racial slur.   Principal Problem: Undifferentiated schizophrenia ([place]) Diagnosis:   Patient Active Problem List   Diagnosis Date Noted  . Undifferentiated schizophrenia ([place]) [F20.3] 11/04/2016    Priority: High  . Noncompliance with treatment [Z91.19] 04/14/2015   Total Time spent with patient: 20 minutes  Past Psychiatric History: schizophrenia  Past Medical History:  Past Medical History:  Diagnosis Date  . Anxiety   . Generalized anxiety disorder   . Schizophrenia [facility])     Past Surgical History:  Procedure Laterality Date  . TONSILLECTOMY     Family History:  Family History  Family history unknown: Yes   Family Psychiatric  History: none reported Social History:  Social History   Substance and Sexual Activity  Alcohol Use No  . Alcohol/week: 0.0 oz     Social History   Substance and Sexual Activity  Drug Use No    Social History   Socioeconomic History  . Marital status: Single    Spouse name: None  . Number of children: None  . Years of education: None  . Highest education level: None  Social Needs  . Financial resource strain: None  . Food insecurity - worry: None  . Food insecurity - inability: None  . Transportation  needs - medical: None  . Transportation needs - non-medical: None  Occupational History  . None  Tobacco Use  . Smoking status: Former Smoker    Types: Cigarettes    Last attempt to quit: 06/11/1999    Years since quitting: 18.1  . Smokeless tobacco: Never Used  Substance and Sexual Activity  . Alcohol use: No    Alcohol/week: 0.0 oz  . Drug use: No  . Sexual activity: No  Other Topics Concern  . None  Social History Narrative  . None   Additional Social History:                         Sleep: Good  Appetite:  Fair  Current Medications: Current Facility-Administered Medications  Medication Dose Route Frequency Provider Last Rate Last Dose  . acetaminophen (TYLENOL) tablet 650 mg  650 mg Oral Q6H PRN Clapacs, [name], MD   650 mg at 07/25/17 2135  . alum & mag hydroxide-simeth (MAALOX/MYLANTA) 200-200-20 MG/5ML suspension 30 mL  30 mL Oral Q4H PRN Clapacs, [name], MD      . cloZAPine (CLOZARIL) tablet 250 mg  250 mg Oral QHS Janes Colegrove B, MD      . divalproex (DEPAKOTE) DR tablet 500 mg  500 mg Oral Q12H Clapacs, [name], MD   500 mg at 08/04/17 0837  . [START ON 08/07/2017] haloperidol decanoate (HALDOL DECANOATE)  100 MG/ML injection 100 mg  100 mg Intramuscular Q21 days Jalynne Persico B, MD      . magnesium hydroxide (MILK OF MAGNESIA) suspension 30 mL  30 mL Oral Daily PRN Clapacs, [name], MD        Lab Results: No results found for this or any previous visit (from the past 48 hour(s)).  Blood Alcohol level:  Lab Results  Component Value Date   ETH <10 07/15/2017   ETH <10 99/37/1696    Metabolic Disorder Labs: Lab Results  Component Value Date   HGBA1C 5.4 06/19/2017   MPG 108.28 06/19/2017   MPG 111 11/05/2016   No results found for: PROLACTIN Lab Results  Component Value Date   CHOL 240 (H) 06/19/2017   TRIG 290 (H) 06/19/2017   HDL 36 (L) 06/19/2017   CHOLHDL 6.7 06/19/2017   VLDL 58 (H) 06/19/2017   LDLCALC 146 (H) 06/19/2017    LDLCALC 115 (H) 11/05/2016    Physical Findings: AIMS: Facial and Oral Movements Muscles of Facial Expression: None, normal Lips and Perioral Area: None, normal Jaw: None, normal Tongue: None, normal,Extremity Movements Upper (arms, wrists, hands, fingers): None, normal Lower (legs, knees, ankles, toes): None, normal, Trunk Movements Neck, shoulders, hips: None, normal, Overall Severity Severity of abnormal movements (highest score from questions above): None, normal Incapacitation due to abnormal movements: None, normal Patient's awareness of abnormal movements (rate only patient's report): No Awareness, Dental Status Current problems with teeth and/or dentures?: Yes(Poor dentition) Does patient usually wear dentures?: No  CIWA:    COWS:     Musculoskeletal: Strength & Muscle Tone: within normal limits Gait & Station: normal Patient leans: N/A  Psychiatric Specialty Exam: Physical Exam  Nursing note and vitals reviewed. Psychiatric: His affect is blunt. His speech is slurred. He is slowed and withdrawn. Thought content is paranoid. Cognition and memory are normal. He expresses impulsivity.    Review of Systems  Neurological: Negative.   Psychiatric/Behavioral: Positive for hallucinations.  All other systems reviewed and are negative.   Blood pressure 104/68, pulse 94, temperature 97.6 F (36.4 C), temperature source Oral, resp. rate 18, height 5\' 6"  (1.676 m), weight 93.9 kg (207 lb), SpO2 96 %.Body mass index is 33.41 kg/m.  General Appearance: Casual  Eye Contact:  Good  Speech:  Slurred  Volume:  Increased  Mood:  Anxious  Affect:  Blunt  Thought Process:  Disorganized and Descriptions of Associations: Tangential  Orientation:  Full (Time, Place, and Person)  Thought Content:  Paranoid Ideation  Suicidal Thoughts:  No  Homicidal Thoughts:  No  Memory:  Immediate;   Fair Recent;   Fair Remote;   Fair  Judgement:  Poor  Insight:  Lacking  Psychomotor Activity:   Psychomotor Retardation  Concentration:  Concentration: Fair and Attention Span: Fair  Recall:  [employer] of Knowledge:  Fair  Language:  Fair  Akathisia:  No  Handed:  Right  AIMS (if indicated):     Assets:  Communication Skills Desire for Improvement Financial Resources/Insurance Housing Physical Health Resilience Social Support  ADL's:  Intact  Cognition:  WNL  Sleep:  Number of Hours: 5.45     Treatment Plan Summary: Daily contact with patient to assess and evaluate symptoms and progress in treatment and Medication management    Mr. Kassa is a 52 year old male with a history of schizophrenia admitted floridly psychotic in the context of medication noncompliance.He has been making very slow progress. He has been sedated from Clozapine.  #  Psychosis -continueHaldol decanoate100 mgmonthly injections, next injection on 2/28 -continueDepakote500 mg BID. VPA level was 76 on 2/16, VPA and ammonia level in am -lower clozapine to 250 mg due to sedation. It is highly possible that patient's sedation will decrease in severity over the course of the next 2-3 days.    #Metabolic syndrome monitoring -Lipid panel, TSH and HgbA1C were recently done -EKG, QTc 435  #Disposition -discharge to his apartment -followup with [facility] teamon outpatient commitment      Orson Slick, MD 08/04/2017, 6:24 PM

## 2017-08-05 LAB — VALPROIC ACID LEVEL: Valproic Acid Lvl: 59 ug/mL (ref 50.0–100.0)

## 2017-08-05 LAB — AMMONIA: Ammonia: 34 umol/L (ref 9–35)

## 2017-08-05 NOTE — [facility] Group Notes (Signed)
08/05/2017 1PM  Type of Therapy/Topic:  Group Therapy:  Feelings about Diagnosis  Participation Level:  Did Not Attend   Description of Group:   This group will allow patients to explore their thoughts and feelings about diagnoses they have received. Patients will be guided to explore their level of understanding and acceptance of these diagnoses. Facilitator will encourage patients to process their thoughts and feelings about the reactions of others to their diagnosis and will guide patients in identifying ways to discuss their diagnosis with significant others in their lives. This group will be process-oriented, with patients participating in exploration of their own experiences, giving and receiving support, and processing challenge from other group members.   Therapeutic Goals: 1. Patient will demonstrate understanding of diagnosis as evidenced by identifying two or more symptoms of the disorder 2. Patient will be able to express two feelings regarding the diagnosis 3. Patient will demonstrate their ability to communicate their needs through discussion and/or role play  Summary of Patient Progress: Patient was encouraged and invited to attend group. Patient did not attend group. Social worker will continue to encourage group participation in the future.        Therapeutic Modalities:   Cognitive Behavioral Therapy Brief Therapy Feelings Identification    Darin Engels, LCSW 08/05/2017 1:48 PM

## 2017-08-05 NOTE — Progress Notes (Signed)
Received Benjamin Braun this am in his room asleep, he slept through breakfast, but got up to eat lunch. He refused to get up for his medication, stated he was weak and dizzy and wanted assistance. He was encouraged to sit up and take his medication at 12noon. He immediately returned to a prone position. He refused to answer the assessment questions and quite irritable.

## 2017-08-05 NOTE — [facility] Group Notes (Signed)
  08/05/2017  Time: 0900  Type of Therapy and Topic:  Group Therapy:  Setting Goals Participation Level:  Did Not Attend  Description of Group: In this process group, patients discussed using strengths to work toward goals and address challenges.  Patients identified two positive things about themselves and one goal they were working on.  Patients were given the opportunity to share openly and support each other's plan for self-empowerment.  The group discussed the value of gratitude and were encouraged to have a daily reflection of positive characteristics or circumstances.  Patients were encouraged to identify a plan to utilize their strengths to work on current challenges and goals.  Therapeutic Goals 1. Patient will verbalize personal strengths/positive qualities and relate how these can assist with achieving desired personal goals 2. Patients will verbalize affirmation of peers plans for personal change and goal setting 3. Patients will explore the value of gratitude and positive focus as related to successful achievement of goals 4. Patients will verbalize a plan for regular reinforcement of personal positive qualities and circumstances.  Summary of Patient Progress: Pt was invited to attend group but chose not to attend. CSW will continue to encourage pt to attend group throughout their admission.    Therapeutic Modalities Cognitive Behavioral Therapy Motivational Interviewing  Alden Hipp, MSW, LCSW 08/05/2017 9:34 AM

## 2017-08-05 NOTE — Progress Notes (Signed)
[facility] MD Progress Note  08/05/2017 1:13 PM Lytle Malburg  MRN:  [id number]  Subjective:   Mr. Cazarez is still very sedated in spite of lower dose of Clozapine.   Treatment plan. Continue Haldol decanoate injections, next on 2/28, Depakote without a change as level is therapeutic and ammonia normal, and Clozapine.  Social/disposition. Discharge to his apartment. Follow up TBE.  Principal Problem: Undifferentiated schizophrenia ([place]) Diagnosis:   Patient Active Problem List   Diagnosis Date Noted  . 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 Noncompliance with treatment [Z91.19] 04/14/2015   Total Time spent with patient: 15 minutes  Past Psychiatric History: schizophrenia  Past Medical History:  Past Medical History:  Diagnosis Date  . Anxiety   . Generalized anxiety disorder   . Schizophrenia [facility])     Past Surgical History:  Procedure Laterality Date  . TONSILLECTOMY     Family History:  Family History  Family history unknown: Yes   Family Psychiatric  History: none reported Social History:  Social History   Substance and Sexual Activity  Alcohol Use No  . Alcohol/week: 0.0 oz     Social History   Substance and Sexual Activity  Drug Use No    Social History   Socioeconomic History  . Marital status: Single    Spouse name: None  . Number of children: None  . Years of education: None  . Highest education level: None  Social Needs  . Financial resource strain: None  . Food insecurity - worry: None  . Food insecurity - inability: None  . Transportation needs - medical: None  . Transportation needs - non-medical: None  Occupational History  . None  Tobacco Use  . Smoking status: Former Smoker    Types: Cigarettes    Last attempt to quit: 06/11/1999    Years since quitting: 18.1  . Smokeless tobacco: Never Used  Substance and Sexual Activity  . Alcohol use: No    Alcohol/week: 0.0 oz  . Drug use: No  . Sexual  activity: No  Other Topics Concern  . None  Social History Narrative  . None   Additional Social History:                         Sleep: Fair  Appetite:  Fair  Current Medications: Current Facility-Administered Medications  Medication Dose Route Frequency Provider Last Rate Last Dose  . acetaminophen (TYLENOL) tablet 650 mg  650 mg Oral Q6H PRN Clapacs, [name], MD   650 mg at 07/25/17 2135  . alum & mag hydroxide-simeth (MAALOX/MYLANTA) 200-200-20 MG/5ML suspension 30 mL  30 mL Oral Q4H PRN Clapacs, [name], MD      . cloZAPine (CLOZARIL) tablet 250 mg  250 mg Oral QHS Konnie Noffsinger B, MD   250 mg at 08/04/17 2133  . divalproex (DEPAKOTE) DR tablet 500 mg  500 mg Oral Q12H Clapacs, [name], MD   500 mg at 08/05/17 1219  . [START ON 08/07/2017] haloperidol decanoate (HALDOL DECANOATE) 100 MG/ML injection 100 mg  100 mg Intramuscular Q21 days Elicia Lui B, MD      . magnesium hydroxide (MILK OF MAGNESIA) suspension 30 mL  30 mL Oral Daily PRN Clapacs, [name], MD        Lab Results:  Results for orders placed or performed during the hospital encounter of 07/16/17 (from the past 48 hour(s))  Valproic acid level  Status: None   Collection Time: 08/05/17  6:53 AM  Result Value Ref Range   Valproic Acid Lvl 59 50.0 - 100.0 ug/mL    Comment: Performed at [facility], [address] [place], [place] [address]  Ammonia     Status: None   Collection Time: 08/05/17  6:53 AM  Result Value Ref Range   Ammonia 34 9 - 35 umol/L    Comment: Performed at [facility], [address] [place], [place] [address]    Blood Alcohol level:  Lab Results  Component Value Date   [facility] <10 07/15/2017   ETH <10 02/54/2706    Metabolic Disorder Labs: Lab Results  Component Value Date   HGBA1C 5.4 06/19/2017   MPG 108.28 06/19/2017   MPG 111 11/05/2016   No results found for: PROLACTIN Lab Results  Component Value Date   CHOL 240 (H) 06/19/2017    TRIG 290 (H) 06/19/2017   HDL 36 (L) 06/19/2017   CHOLHDL 6.7 06/19/2017   VLDL 58 (H) 06/19/2017   LDLCALC 146 (H) 06/19/2017   LDLCALC 115 (H) 11/05/2016    Physical Findings: AIMS: Facial and Oral Movements Muscles of Facial Expression: None, normal Lips and Perioral Area: None, normal Jaw: None, normal Tongue: None, normal,Extremity Movements Upper (arms, wrists, hands, fingers): None, normal Lower (legs, knees, ankles, toes): None, normal, Trunk Movements Neck, shoulders, hips: None, normal, Overall Severity Severity of abnormal movements (highest score from questions above): None, normal Incapacitation due to abnormal movements: None, normal Patient's awareness of abnormal movements (rate only patient's report): No Awareness, Dental Status Current problems with teeth and/or dentures?: Yes(Poor dentition) Does patient usually wear dentures?: No  CIWA:    COWS:     Musculoskeletal: Strength & Muscle Tone: within normal limits Gait & Station: normal Patient leans: N/A  Psychiatric Specialty Exam: Physical Exam  Nursing note and vitals reviewed. Psychiatric: His affect is blunt. His speech is slurred. He is withdrawn. Thought content is paranoid and delusional. Cognition and memory are impaired. He expresses impulsivity.    Review of Systems  Neurological: Negative.   Psychiatric/Behavioral: Positive for hallucinations.  All other systems reviewed and are negative.   Blood pressure 123/85, pulse 99, temperature 97.8 F (36.6 C), temperature source Oral, resp. rate 20, height 5\' 6"  (1.676 m), weight 93.9 kg (207 lb), SpO2 96 %.Body mass index is 33.41 kg/m.  General Appearance: Casual  Eye Contact:  Good  Speech:  Slurred  Volume:  Normal  Mood:  Anxious  Affect:  Blunt  Thought Process:  Goal Directed and Descriptions of Associations: Intact  Orientation:  Full (Time, Place, and Person)  Thought Content:  Paranoid Ideation  Suicidal Thoughts:  No  Homicidal  Thoughts:  No  Memory:  Immediate;   Fair Recent;   Fair Remote;   Fair  Judgement:  Poor  Insight:  Lacking  Psychomotor Activity:  Psychomotor Retardation  Concentration:  Concentration: Fair and Attention Span: Fair  Recall:  [employer] of Knowledge:  Fair  Language:  Fair  Akathisia:  No  Handed:  Right  AIMS (if indicated):     Assets:  Communication Skills Desire for Improvement Financial Resources/Insurance Housing Physical Health Resilience Social Support  ADL's:  Intact  Cognition:  WNL  Sleep:  Number of Hours: 11.5     Treatment Plan Summary: Daily contact with patient to assess and evaluate symptoms and progress in treatment and Medication management   Mr. Arther is a 52 year old male  with a history of schizophrenia admitted floridly psychotic in the context of medication noncompliance.He has been making very slow progress. He has been sedated from Clozapine.  #Psychosis -continueHaldol decanoate100 mgmonthly injections, next injection on 2/28 -continueDepakote500 mg BID. VPA level was 76 on 2/16, 58 on 2/26, ammonia level 34 -lower clozapine to 250 mg due to sedation. It is highly possible that patient'ssedation will decrease in severity over the course of the next 2-3 days, clozapine level pending  #Metabolic syndrome monitoring -Lipid panel, TSH and HgbA1C were recently done -EKG, QTc 435  #Disposition -discharge to his apartment -followup with [facility] teamon outpatient commitment     Orson Slick, MD 08/05/2017, 1:13 PM

## 2017-08-06 LAB — CLOZAPINE (CLOZARIL)
Clozapine Lvl: 677 ng/mL — ABNORMAL HIGH (ref 350–650)
NorClozapine: 205 ng/mL
TOTAL(CLOZ+ NORCLOZ): 882 ng/mL

## 2017-08-06 MED ORDER — HALOPERIDOL DECANOATE 100 MG/ML IM SOLN
200.0000 mg | INTRAMUSCULAR | Status: DC
  Administered 2017-08-07: 200 mg via INTRAMUSCULAR
  Filled 2017-08-06: qty 2

## 2017-08-06 MED ORDER — CLOZAPINE 100 MG PO TABS
100.0000 mg | ORAL_TABLET | Freq: Every day | ORAL | Status: DC
  Administered 2017-08-06 – 2017-08-07 (×2): 100 mg via ORAL
  Filled 2017-08-06 (×2): qty 1

## 2017-08-06 MED ORDER — CLOZAPINE 100 MG PO TABS: 200.0000 mg | ORAL_TABLET | Freq: Every day | ORAL | Status: DC

## 2017-08-06 NOTE — Plan of Care (Addendum)
Patient found asleep in bed upon my arrival. Patient is isolative to his bed aside from getting up to eat a snack. Neither visible nor social tonight. Patient complains of hypersalivation and is found lying in drool that has soaked both pillows and the bed underneath. Patient reports, "I don't know why I have so much spit. I fall asleep and it's all over the place." Changed bedding and provided towel to lie on. Spoke with [name]. HOB raised slightly as per patient comfort. Later received order discontinuing antipsychotic. Patient denies SI, HI, AVH. Delusions regarding religiosity and paranoia remain fixed. Patient reports hypersomnolence. "I don't know why I'm so tired. Doctor says I'm going home on Monday. Do you know if that is true?" Encouraged patient to speak with Treatment Team regarding specifics of his discharge. Patient verbalized understanding. Compliant with HS Depakote and staff direction. Q 15 minute checks maintained. Will continue to monitor throughout the shift.  Patient slept 7 hours. No apparent distress. VSS. Obtained bedside. Remains asleep at this time. Will endorse care to oncoming shift.  Progressing Coping: Ability to verbalize feelings will improve 08/06/2017 0005 - Progressing by Derek Mound, RN Nutritional: Ability to achieve adequate nutritional intake will improve 08/06/2017 0005 - Progressing by Derek Mound, RN Safety: Ability to redirect hostility and anger into socially appropriate behaviors will improve 08/06/2017 0005 - Progressing by Derek Mound, RN Self-Concept: Level of anxiety will decrease 08/06/2017 0005 - Progressing by Derek Mound, RN   Not Progressing Self-Care: Ability to participate in self-care as condition permits will improve 08/06/2017 0005 - Not Progressing by Derek Mound, RN

## 2017-08-06 NOTE — Progress Notes (Signed)
D- Patient alert and oriented. Patient presents in a pleasant mood on assessment stating that he did not want his prescribed medication this morning. Patient denies SI, HI, AVH, and pain at this time. Patient denies any signs/symptoms of depression and anxiety at this time. Patient does not have a stated goal at this time.   A- Support and encouragement provided.  Routine safety checks conducted every 15 minutes.  Patient informed to notify staff with problems or concerns.  R- Patient contracts for safety at this time. Patient receptive, calm, and cooperative. Patient interacts well with others on the unit.  Patient remains safe at this time.

## 2017-08-06 NOTE — Progress Notes (Signed)
[facility] MD Progress Note  08/06/2017 1:27 PM Benjamin Braun  MRN:  [id number]  Subjective:   [name] is more awake today after I held his clozapine last night. We will restart it at a lower dose tonight. 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 He wants to be discharged. He says "I have ;earned my lesson". Clozapine level is back and satisfactory.  Treatment plan. We will give haldol decanoate injection and increase it to 200 mg monthly. We will continue Depakote. I will give 100 mg of Clozapine tonight.  Social/disposition. Discharge to home. Follow up to be established.  Principal Problem: Undifferentiated schizophrenia ([place]) Diagnosis:   Patient Active Problem List   Diagnosis Date Noted  . Undifferentiated schizophrenia ([place]) [F20.3] 11/04/2016    Priority: High  . Noncompliance with treatment [Z91.19] 04/14/2015   Total Time spent with patient: 20 minutes  Past Psychiatric History: schizophrenia  Past Medical History:  Past Medical History:  Diagnosis Date  . Anxiety   . Generalized anxiety disorder   . Schizophrenia [facility])     Past Surgical History:  Procedure Laterality Date  . TONSILLECTOMY     Family History:  Family History  Family history unknown: Yes   Family Psychiatric  History: none reported Social History:  Social History   Substance and Sexual Activity  Alcohol Use No  . Alcohol/week: 0.0 oz     Social History   Substance and Sexual Activity  Drug Use No    Social History   Socioeconomic History  . Marital status: Single    Spouse name: None  . Number of children: None  . Years of education: None  . Highest education level: None  Social Needs  . Financial resource strain: None  . Food insecurity - worry: None  . Food insecurity - inability: None  . Transportation needs - medical: None  . Transportation needs - non-medical: None   Occupational History  . None  Tobacco Use  . Smoking status: Former Smoker    Types: Cigarettes    Last attempt to quit: 06/11/1999    Years since quitting: 18.1  . Smokeless tobacco: Never Used  Substance and Sexual Activity  . Alcohol use: No    Alcohol/week: 0.0 oz  . Drug use: No  . Sexual activity: No  Other Topics Concern  . None  Social History Narrative  . None   Additional Social History:                         Sleep: Fair  Appetite:  Fair  Current Medications: Current Facility-Administered Medications  Medication Dose Route Frequency Provider Last Rate Last Dose  . acetaminophen (TYLENOL) tablet 650 mg  650 mg Oral Q6H PRN Clapacs, [name], MD   650 mg at 07/25/17 2135  . alum & mag hydroxide-simeth (MAALOX/MYLANTA) 200-200-20 MG/5ML suspension 30 mL  30 mL Oral Q4H PRN Clapacs, [name], MD      . divalproex (DEPAKOTE) DR tablet 500 mg  500 mg Oral Q12H Clapacs, [name], MD   500 mg at 08/05/17 2039  . [START ON 08/07/2017] haloperidol decanoate (HALDOL DECANOATE) 100 MG/ML injection 100 mg  100 mg Intramuscular Q21 days Pucilowska, Jolanta B, MD      . magnesium hydroxide (MILK OF MAGNESIA) suspension 30 mL  30 mL Oral Daily PRN Clapacs,  [name], MD        Lab Results:  Results for orders placed or performed during the hospital encounter of 07/16/17 (from the past 48 hour(s))  Valproic acid level     Status: None   Collection Time: 08/05/17  6:53 AM  Result Value Ref Range   Valproic Acid Lvl 59 50.0 - 100.0 ug/mL    Comment: Performed at [facility], [address] [place], [place] [address]  Ammonia     Status: None   Collection Time: 08/05/17  6:53 AM  Result Value Ref Range   Ammonia 34 9 - 35 umol/L    Comment: Performed at [facility], [address] [place], [place] [address]    Blood Alcohol level:  Lab Results  Component Value Date   [facility] <10 07/15/2017   ETH <10 70/35/0093    Metabolic Disorder Labs: Lab  Results  Component Value Date   HGBA1C 5.4 06/19/2017   MPG 108.28 06/19/2017   MPG 111 11/05/2016   No results found for: PROLACTIN Lab Results  Component Value Date   CHOL 240 (H) 06/19/2017   TRIG 290 (H) 06/19/2017   HDL 36 (L) 06/19/2017   CHOLHDL 6.7 06/19/2017   VLDL 58 (H) 06/19/2017   LDLCALC 146 (H) 06/19/2017   LDLCALC 115 (H) 11/05/2016    Physical Findings: AIMS: Facial and Oral Movements Muscles of Facial Expression: None, normal Lips and Perioral Area: None, normal Jaw: None, normal Tongue: None, normal,Extremity Movements Upper (arms, wrists, hands, fingers): None, normal Lower (legs, knees, ankles, toes): None, normal, Trunk Movements Neck, shoulders, hips: None, normal, Overall Severity Severity of abnormal movements (highest score from questions above): None, normal Incapacitation due to abnormal movements: None, normal Patient's awareness of abnormal movements (rate only patient's report): No Awareness, Dental Status Current problems with teeth and/or dentures?: Yes(Poor dentition) Does patient usually wear dentures?: No  CIWA:    COWS:     Musculoskeletal: Strength & Muscle Tone: within normal limits Gait & Station: normal Patient leans: N/A  Psychiatric Specialty Exam: Physical Exam  Nursing note and vitals reviewed. Psychiatric: His affect is blunt. His speech is delayed and slurred. He is withdrawn. Thought content is paranoid. Cognition and memory are impaired. He expresses impulsivity.    Review of Systems  HENT: Positive for hearing loss.   Neurological: Negative.   Psychiatric/Behavioral: Positive for hallucinations.  All other systems reviewed and are negative.   Blood pressure 113/76, pulse 98, temperature 98.4 F (36.9 C), temperature source Oral, resp. rate 18, height 5\' 6"  (1.676 m), weight 93.9 kg (207 lb), SpO2 96 %.Body mass index is 33.41 kg/m.  General Appearance: Fairly Groomed  Eye Contact:  Good  Speech:  Slurred   Volume:  Normal  Mood:  Depressed  Affect:  Blunt  Thought Process:  Goal Directed and Descriptions of Associations: Tangential  Orientation:  Full (Time, Place, and Person)  Thought Content:  Delusions and Paranoid Ideation  Suicidal Thoughts:  No  Homicidal Thoughts:  No  Memory:  Immediate;   Fair Recent;   Fair Remote;   Fair  Judgement:  Poor  Insight:  Lacking  Psychomotor Activity:  Psychomotor Retardation  Concentration:  Concentration: Fair and Attention Span: Fair  Recall:  [employer] of Knowledge:  Fair  Language:  Fair  Akathisia:  No  Handed:  Right  AIMS (if indicated):     Assets:  Communication Skills Desire for Improvement Financial Resources/Insurance [place]  Social Support  ADL's:  Intact  Cognition:  WNL  Sleep:  Number of Hours: 7     Treatment Plan Summary: Daily contact with patient to assess and evaluate symptoms and progress in treatment and Medication management   Mr. Lasecki is a 53 year old male with a history of schizophrenia admitted floridly psychotic in the context of medication noncompliance.He has been making very slow progress. He has been sedated from Clozapine.  #Psychosis -increase Haldol decanoateto 200 mg monthly, next injection on2/28 -continueDepakote500 mg BID. VPA level was 76 on 2/16, 58 on 2/26, ammonia level 34 -lower clozapine to 100 mg nightly, Clozapine level 536  #Metabolic syndrome monitoring -Lipid panel, TSH and HgbA1C were recently done -EKG, QTc 435  #Admission status -IVC  #Disposition -discharge to his apartment -followup with [facility] teamon outpatient commitment     Orson Slick, MD 08/06/2017, 1:27 PM

## 2017-08-06 NOTE — Tx Team (Signed)
Interdisciplinary Treatment and Diagnostic Plan Update  08/06/2017 Time of Session: 10:40 AM Benjamin Braun MRN: [id number]  Principal Diagnosis: Undifferentiated schizophrenia [facility])  Secondary Diagnoses: Principal Problem:   Undifferentiated schizophrenia ([place]) Active Problems:   Noncompliance with treatment   Current Medications:  Current Facility-Administered Medications  Medication Dose Route Frequency Provider Last Rate Last Dose  . acetaminophen (TYLENOL) tablet 650 mg  650 mg Oral Q6H PRN Clapacs, [name], MD   650 mg at 07/25/17 2135  . alum & mag hydroxide-simeth (MAALOX/MYLANTA) 200-200-20 MG/5ML suspension 30 mL  30 mL Oral Q4H PRN Clapacs, [name], MD      . divalproex (DEPAKOTE) DR tablet 500 mg  500 mg Oral Q12H Clapacs, [name], MD   500 mg at 08/05/17 2039  . [START ON 08/07/2017] haloperidol decanoate (HALDOL DECANOATE) 100 MG/ML injection 100 mg  100 mg Intramuscular Q21 days Pucilowska, Jolanta B, MD      . magnesium hydroxide (MILK OF MAGNESIA) suspension 30 mL  30 mL Oral Daily PRN Clapacs, [name], MD       PTA Medications: Medications Prior to Admission  Medication Sig Dispense Refill Last Dose  . divalproex (DEPAKOTE) 500 MG DR tablet Take 1 tablet (500 mg total) by mouth every 12 (twelve) hours. 60 tablet 1 07/16/2017 at 1051  . haloperidol decanoate (HALDOL DECANOATE) 100 MG/ML injection Inject 1 mL (100 mg total) into the muscle every 30 (thirty) days. Next injection on 12/09/2016. 1 mL 1 07/16/2017 at Unknown per pt.  Marland Kitchen OLANZapine (ZYPREXA) 15 MG tablet Take 1 tablet (15 mg total) by mouth at bedtime. 30 tablet 1 07/16/2017 at Unknown per pt.    Patient Stressors: Medication change or noncompliance  Patient Strengths: Ability for insight Average or above average intelligence General fund of knowledge Motivation for treatment/growth  Treatment Modalities: Medication Management, Group therapy, Case management,  1 to 1 session with clinician, Psychoeducation,  Recreational therapy.   Physician Treatment Plan for Primary Diagnosis: Undifferentiated schizophrenia ([place]) Long Term Goal(s): Improvement in symptoms so as ready for discharge NA   Short Term Goals: Ability to identify changes in lifestyle to reduce recurrence of condition will improve Ability to verbalize feelings will improve Ability to disclose and discuss suicidal ideas Ability to demonstrate self-control will improve Ability to identify and develop effective coping behaviors will improve Ability to maintain clinical measurements within normal limits will improve Compliance with prescribed medications will improve Ability to identify triggers associated with substance abuse/mental health issues will improve NA  Medication Management: Evaluate patient's response, side effects, and tolerance of medication regimen.  Therapeutic Interventions: 1 to 1 sessions, Unit Group sessions and Medication administration.  Evaluation of Outcomes: Progressing  Physician Treatment Plan for Secondary Diagnosis: Principal Problem:   Undifferentiated schizophrenia ([place]) Active Problems:   Noncompliance with treatment  Long Term Goal(s): Improvement in symptoms so as ready for discharge NA   Short Term Goals: Ability to identify changes in lifestyle to reduce recurrence of condition will improve Ability to verbalize feelings will improve Ability to disclose and discuss suicidal ideas Ability to demonstrate self-control will improve Ability to identify and develop effective coping behaviors will improve Ability to maintain clinical measurements within normal limits will improve Compliance with prescribed medications will improve Ability to identify triggers associated with substance abuse/mental health issues will improve NA     Medication Management: Evaluate patient's response, side effects, and tolerance of medication regimen.  Therapeutic Interventions: 1 to 1 sessions, Unit Group  sessions and  Medication administration.  Evaluation of Outcomes: Progressing   RN Treatment Plan for Primary Diagnosis: Undifferentiated schizophrenia ([place]) Long Term Goal(s): Knowledge of disease and therapeutic regimen to maintain health will improve  Short Term Goals: Ability to demonstrate self-control, Ability to identify and develop effective coping behaviors will improve and Compliance with prescribed medications will improve  Medication Management: RN will administer medications as ordered by provider, will assess and evaluate patient's response and provide education to patient for prescribed medication. RN will report any adverse and/or side effects to prescribing provider.  Therapeutic Interventions: 1 on 1 counseling sessions, Psychoeducation, Medication administration, Evaluate responses to treatment, Monitor vital signs and CBGs as ordered, Perform/monitor CIWA, COWS, AIMS and Fall Risk screenings as ordered, Perform wound care treatments as ordered.  Evaluation of Outcomes: Progressing   LCSW Treatment Plan for Primary Diagnosis: Undifferentiated schizophrenia ([place]) Long Term Goal(s): Safe transition to appropriate next level of care at discharge, Engage patient in therapeutic group addressing interpersonal concerns.  Short Term Goals: Engage patient in aftercare planning with referrals and resources and Increase skills for wellness and recovery  Therapeutic Interventions: Assess for all discharge needs, 1 to 1 time with Social worker, Explore available resources and support systems, Assess for adequacy in community support network, Educate family and significant other(s) on suicide prevention, Complete Psychosocial Assessment, Interpersonal group therapy.  Evaluation of Outcomes: Progressing   Progress in Treatment: Attending groups: No. Participating in groups: No. Taking medication as prescribed: Yes. Toleration medication: Yes. Family/Significant other contact made:  Yes, individual(s) contacted:  Pt's brother, Louie Casa has been contacted. Patient understands diagnosis: No. Discussing patient identified problems/goals with staff: Yes. Medical problems stabilized or resolved: Yes. Denies suicidal/homicidal ideation: Yes. Issues/concerns per patient self-inventory: No. Other: n/a  New problem(s) identified: No, Describe:  No new problems identified  New Short Term/Long Term Goal(s): Jayme has improved some regarding his delusions and hallucinations and has stated that he is ready to be discharged home.  He has expressed a desire to engage in ACT services.  Discharge Plan or Barriers: Nicasio will discharge back to his home tentatively  with ACT services in place. [place] ACT Team Leader, Thressa Sheller is scheduled to meet with him on 08/01/17 to assess his readiness to engage in services in the community.  Reason for Continuation of Hospitalization: Delusions  Hallucinations Medication stabilization  Estimated Length of Stay: 3 days  Attendees: Patient: 08/06/2017 11:37 AM  Physician: [name], MD 08/06/2017 11:37 AM  Nursing: Roetta Sessions, RN 08/06/2017 11:37 AM  RN Care Manager: 08/06/2017 11:37 AM  Social Worker: Alden Hipp, LCSW 08/06/2017 11:37 AM  Recreational Therapist:  08/06/2017 11:37 AM  Other: Darin Engels, [place] 08/06/2017 11:37 AM  Other:  08/06/2017 11:37 AM  Other: 08/06/2017 11:37 AM     Scribe for Treatment Team: Alden Hipp, LCSW 08/06/2017 11:47 AM

## 2017-08-06 NOTE — BHH Group Notes (Signed)
08/06/2017 1PM  Type of Therapy/Topic:  Group Therapy:  Emotion Regulation  Participation Level:  Did Not Attend   Description of Group:   The purpose of this group is to assist patients in learning to regulate negative emotions and experience positive emotions. Patients will be guided to discuss ways in which they have been vulnerable to their negative emotions. These vulnerabilities will be juxtaposed with experiences of positive emotions or situations, and patients will be challenged to use positive emotions to combat negative ones. Special emphasis will be placed on coping with negative emotions in conflict situations, and patients will process healthy conflict resolution skills.  Therapeutic Goals: 1. Patient will identify two positive emotions or experiences to reflect on in order to balance out negative emotions 2. Patient will label two or more emotions that they find the most difficult to experience 3. Patient will demonstrate positive conflict resolution skills through discussion and/or role plays  Summary of Patient Progress: Patient was encouraged and invited to attend group. Patient did not attend group. Social worker will continue to encourage group participation in the future.        Therapeutic Modalities:   Cognitive Behavioral Therapy Feelings Identification Dialectical Behavioral Therapy   Darin Engels, LCSW 08/06/2017 1:50 PM

## 2017-08-06 NOTE — Plan of Care (Signed)
Patient is encouraged to get out of his room to socialize more with peers and participate In activities of ADLs , patient isolate him self in his room, takes his medicines and remain safe in the unit, patient contract for safety of self and others Denies any SI/HI no signs of hallucinations. Progressing Activity: Will verbalize the importance of balancing activity with adequate rest periods 08/06/2017 2004 - Progressing by Clemens Catholic, RN Coping: Ability to cope will improve 08/06/2017 2004 - Progressing by Clemens Catholic, RN Ability to verbalize feelings will improve 08/06/2017 2004 - Progressing by Clemens Catholic, RN Safety: Ability to redirect hostility and anger into socially appropriate behaviors will improve 08/06/2017 2004 - Progressing by Clemens Catholic, RN Self-Care: Ability to participate in self-care as condition permits will improve 08/06/2017 2004 - Progressing by Clemens Catholic, RN Self-Concept: Ability to verbalize positive feelings about self will improve 08/06/2017 2004 - Progressing by Clemens Catholic, RN Education: Ability to state activities that reduce stress will improve 08/06/2017 2004 - Progressing by Clemens Catholic, RN Education: Knowledge of [place] Education information/materials will improve 08/06/2017 2004 - Progressing by Clemens Catholic, RN

## 2017-08-07 NOTE — BHH Group Notes (Signed)
08/07/2017  Time: 1PM  Type of Therapy/Topic:  Group Therapy:  Balance in Life  Participation Level:  Did Not Attend  Description of Group:   This group will address the concept of balance and how it feels and looks when one is unbalanced. Patients will be encouraged to process areas in their lives that are out of balance and identify reasons for remaining unbalanced. Facilitators will guide patients in utilizing problem-solving interventions to address and correct the stressor making their life unbalanced. Understanding and applying boundaries will be explored and addressed for obtaining and maintaining a balanced life. Patients will be encouraged to explore ways to assertively make their unbalanced needs known to significant others in their lives, using other group members and facilitator for support and feedback.  Therapeutic Goals: 1. Patient will identify two or more emotions or situations they have that consume much of in their lives. 2. Patient will identify signs/triggers that life has become out of balance:  3. Patient will identify two ways to set boundaries in order to achieve balance in their lives:  4. Patient will demonstrate ability to communicate their needs through discussion and/or role plays  Summary of Patient Progress: Pt was invited to attend group but chose not to attend. CSW will continue to encourage pt to attend group throughout their admission.    Therapeutic Modalities:   Cognitive Behavioral Therapy Solution-Focused Therapy Assertiveness Training  Alden Hipp, MSW, LCSW 08/07/2017 1:51 PM

## 2017-08-07 NOTE — Progress Notes (Signed)
Patient ID: Benjamin Braun, male   DOB: 1965-10-02, 52 y.o.   MRN: [id number] CSW followed up with Thressa Sheller, TL with [facility] ACTT.  CSW inquired if [name] was going to be able to accept pt back on the ACTT.  [name] informed CSW that she had not been able to spoke with the team's psychiatrist as promised to CSW, but that she would make the phone call after speaking with CSW.  [name] agreed to come and meet with pt on Friday, March 1st and would complete her initial assessment if the psychiatrist is in agreement with pt coming back on the ACTT.

## 2017-08-07 NOTE — BHH Group Notes (Signed)
  08/07/2017 9am  Type of Therapy and Topic: Group Therapy: Goals Group: SMART Goals   Participation Level: Did Not Attend  Description of Group:    The purpose of a daily goals group is to assist and guide patients in setting recovery/wellness-related goals. The objective is to set goals as they relate to the crisis in which they were admitted. Patients will be using SMART goal modalities to set measurable goals. Characteristics of realistic goals will be discussed and patients will be assisted in setting and processing how one will reach their goal. Facilitator will also assist patients in applying interventions and coping skills learned in psycho-education groups to the SMART goal and process how one will achieve defined goal.   Therapeutic Goals:   -Patients will develop and document one goal related to or their crisis in which brought them into treatment.  -Patients will be guided by LCSW using SMART goal setting modality in how to set a measurable, attainable, realistic and time sensitive goal.  -Patients will process barriers in reaching goal.  -Patients will process interventions in how to overcome and successful in reaching goal.   Patient's Goal:Patient was encouraged and invited to attend group. Patient did not attend group. Social worker will continue to encourage group participation in the future.    Therapeutic Modalities:  Motivational Interviewing  Cognitive Behavioral Therapy  Crisis Intervention Model  SMART goals setting  Darin Engels, [place] 08/07/2017 9:38 AM

## 2017-08-07 NOTE — Plan of Care (Signed)
  Patient remained isolated to room . Out for meals . Continue to hear voices  compliant  with medications  Continue to work on coping  skills . No anger outburst . No safety concerns .Voice of no anxiety concerns  Progressing Activity: Will verbalize the importance of balancing activity with adequate rest periods 08/07/2017 1804 - Progressing by Leodis Liverpool, RN Education: Will be free of psychotic symptoms 08/07/2017 1804 - Progressing by Leodis Liverpool, RN Knowledge of the prescribed therapeutic regimen will improve 08/07/2017 1804 - Progressing by Leodis Liverpool, RN Coping: Ability to cope will improve 08/07/2017 1804 - Progressing by Leodis Liverpool, RN Ability to verbalize feelings will improve 08/07/2017 1804 - Progressing by Leodis Liverpool, RN Nutritional: Ability to achieve adequate nutritional intake will improve 08/07/2017 1804 - Progressing by Leodis Liverpool, RN Safety: Ability to redirect hostility and anger into socially appropriate behaviors will improve 08/07/2017 1804 - Progressing by Leodis Liverpool, RN Ability to remain free from injury will improve 08/07/2017 1804 - Progressing by Leodis Liverpool, RN Self-Care: Ability to participate in self-care as condition permits will improve 08/07/2017 1804 - Progressing by Leodis Liverpool, RN Self-Concept: Ability to verbalize positive feelings about self will improve 08/07/2017 1804 - Progressing by Leodis Liverpool, RN Education: Ability to state activities that reduce stress will improve 08/07/2017 1804 - Progressing by Leodis Liverpool, RN Self-Concept: Level of anxiety will decrease 08/07/2017 1804 - Progressing by Leodis Liverpool, RN Ability to modify response to factors that promote anxiety will improve 08/07/2017 1804 - Progressing by Leodis Liverpool, RN Education: Knowledge of [place] Education information/materials will improve 08/07/2017 1804 - Progressing by Leodis Liverpool, RN

## 2017-08-07 NOTE — Progress Notes (Signed)
[facility] MD Progress Note  08/07/2017 1:33 PM Benjamin Braun  MRN:  [id number]  Subjective:   Benjamin Braun has no complaints but is still very paranoid about people stealing from him at his apartment. He called his brother asking him to get him out of the hospital.  Spoke with Benjamin Braun, the brother, who has had minimal interaction with Benjamin Braun during this hospitalization but feels that it is safe foer the patient yo return to independent living.   Treatment plan. We gave 200 mg of Haldol decanoate today and continue Depakote and Clozapine. He is quite sedated.  Social/disposition. He will return to his apartment. Needs follow up.  Principal Problem: Undifferentiated schizophrenia ([employer]) Diagnosis:   Patient Active Problem List   Diagnosis Date Noted  . Undifferentiated schizophrenia ([place]) [F20.3] 11/04/2016    Priority: High  . Noncompliance with treatment [Z91.19] 04/14/2015   Total Time spent with patient: 20 minutes  Past Psychiatric History: schizophrenia.  Past Medical History:  Past Medical History:  Diagnosis Date  . Anxiety   . Generalized anxiety disorder   . Schizophrenia [facility])     Past Surgical History:  Procedure Laterality Date  . TONSILLECTOMY     Family History:  Family History  Family history unknown: Yes   Family Psychiatric  History: none reported. Social History:  Social History   Substance and Sexual Activity  Alcohol Use No  . Alcohol/week: 0.0 oz     Social History   Substance and Sexual Activity  Drug Use No    Social History   Socioeconomic History  . Marital status: Single    Spouse name: None  . Number of children: None  . Years of education: None  . Highest education level: None  Social Needs  . Financial resource strain: None  . Food insecurity - worry: None  . Food insecurity - inability: None  . Transportation needs - medical: None  . Transportation needs - non-medical: None  Occupational History  . None  Tobacco Use  . Smoking  status: Former Smoker    Types: Cigarettes    Last attempt to quit: 06/11/1999    Years since quitting: 18.1  . Smokeless tobacco: Never Used  Substance and Sexual Activity  . Alcohol use: No    Alcohol/week: 0.0 oz  . Drug use: No  . Sexual activity: No  Other Topics Concern  . None  Social History Narrative  . None   Additional Social History:                         Sleep: Good  Appetite:  Fair  Current Medications: Current Facility-Administered Medications  Medication Dose Route Frequency Provider Last Rate Last Dose  . acetaminophen (TYLENOL) tablet 650 mg  650 mg Oral Q6H PRN Clapacs, [name], MD   650 mg at 07/25/17 2135  . alum & mag hydroxide-simeth (MAALOX/MYLANTA) 200-200-20 MG/5ML suspension 30 mL  30 mL Oral Q4H PRN Clapacs, [name], MD      . cloZAPine (CLOZARIL) tablet 100 mg  100 mg Oral QHS Afton Lavalle B, MD   100 mg at 08/06/17 2116  . divalproex (DEPAKOTE) DR tablet 500 mg  500 mg Oral Q12H Clapacs, [name], MD   500 mg at 08/07/17 0901  . haloperidol decanoate (HALDOL DECANOATE) 100 MG/ML injection 200 mg  200 mg Intramuscular Q28 days Santia Labate B, MD   200 mg at 08/07/17 0902  . magnesium hydroxide (MILK OF MAGNESIA)  suspension 30 mL  30 mL Oral Daily PRN Clapacs, [name], MD        Lab Results: No results found for this or any previous visit (from the past 48 hour(s)).  Blood Alcohol level:  Lab Results  Component Value Date   ETH <10 07/15/2017   ETH <10 76/16/0737    Metabolic Disorder Labs: Lab Results  Component Value Date   HGBA1C 5.4 06/19/2017   MPG 108.28 06/19/2017   MPG 111 11/05/2016   No results found for: PROLACTIN Lab Results  Component Value Date   CHOL 240 (H) 06/19/2017   TRIG 290 (H) 06/19/2017   HDL 36 (L) 06/19/2017   CHOLHDL 6.7 06/19/2017   VLDL 58 (H) 06/19/2017   LDLCALC 146 (H) 06/19/2017   LDLCALC 115 (H) 11/05/2016    Physical Findings: AIMS: Facial and Oral Movements Muscles of  Facial Expression: None, normal Lips and Perioral Area: None, normal Jaw: None, normal Tongue: None, normal,Extremity Movements Upper (arms, wrists, hands, fingers): None, normal Lower (legs, knees, ankles, toes): None, normal, Trunk Movements Neck, shoulders, hips: None, normal, Overall Severity Severity of abnormal movements (highest score from questions above): None, normal Incapacitation due to abnormal movements: None, normal Patient's awareness of abnormal movements (rate only patient's report): No Awareness, Dental Status Current problems with teeth and/or dentures?: Yes(Poor dentition) Does patient usually wear dentures?: No  CIWA:    COWS:     Musculoskeletal: Strength & Muscle Tone: within normal limits Gait & Station: normal Patient leans: N/A  Psychiatric Specialty Exam: Physical Exam  Nursing note and vitals reviewed. Psychiatric: His affect is blunt. His speech is slurred. He is withdrawn. Thought content is paranoid. Cognition and memory are normal. He expresses impulsivity.    Review of Systems  Neurological: Negative.   Psychiatric/Behavioral: Positive for hallucinations.  All other systems reviewed and are negative.   Blood pressure 119/77, pulse 89, temperature 98.2 F (36.8 C), temperature source Oral, resp. rate 18, height 5\' 6"  (1.676 m), weight 93.9 kg (207 lb), SpO2 98 %.Body mass index is 33.41 kg/m.  General Appearance: Casual  Eye Contact:  Good  Speech:  Clear and Coherent  Volume:  Normal  Mood:  Euthymic  Affect:  Blunt  Thought Process:  Goal Directed and Descriptions of Associations: Intact  Orientation:  Full (Time, Place, and Person)  Thought Content:  Delusions and Paranoid Ideation  Suicidal Thoughts:  No  Homicidal Thoughts:  No  Memory:  Immediate;   Fair Recent;   Fair Remote;   Fair  Judgement:  Poor  Insight:  Lacking  Psychomotor Activity:  Psychomotor Retardation  Concentration:  Concentration: Fair and Attention Span: Fair   Recall:  [employer] of Knowledge:  Fair  Language:  Fair  Akathisia:  No  Handed:  Right  AIMS (if indicated):     Assets:  Communication Skills Desire for Improvement Financial Resources/Insurance Housing Physical Health Resilience Social Support  ADL's:  Intact  Cognition:  WNL  Sleep:  Number of Hours: 7     Treatment Plan Summary: Daily contact with patient to assess and evaluate symptoms and progress in treatment and Medication management   Mr. Brew is a 52 year old male with a history of schizophrenia admitted floridly psychotic in the context of medication noncompliance.He has been making very slow progress. He has been sedated from Clozapine.  #Psychosis -increase Haldol decanoateto 200 mg monthly, next injection on3/28 -continueDepakote500 mg BID. VPA level was 76 on 2/16,58 on 2/26,ammonia level34 -lower  clozapine to 100 mg nightly, Clozapine level 169  #Metabolic syndrome monitoring -Lipid panel, TSH and HgbA1C were recently done -EKG, QTc 435  #Admission status -IVC  #Disposition -discharge to his apartment -followup with [facility] ACT teamon outpatient commitment     Orson Slick, MD 08/07/2017, 1:33 PM

## 2017-08-07 NOTE — Progress Notes (Signed)
D:Patient remained isolated to room . Out for meals . Continue to hear voices  compliant  with medications  Continue to work on coping  skills . No anger outburst . No safety concerns .Voice of no anxiety concerns  . auditory hallucinations  No pain concerns . No ADL'S. Limited  Interacting with peers and staff.  A: Encourage patient participation with unit programming . Instruction  Given on  Medication , verbalize understanding. R: Voice no other concerns. Staff continue to monitor

## 2017-08-08 LAB — CBC WITH DIFFERENTIAL/PLATELET
Basophils Absolute: 0.1 10*3/uL (ref 0–0.1)
Basophils Relative: 1 %
EOS ABS: 0.2 10*3/uL (ref 0–0.7)
Eosinophils Relative: 2 %
HEMATOCRIT: 43.8 % (ref 40.0–52.0)
Hemoglobin: 14.9 g/dL (ref 13.0–18.0)
LYMPHS ABS: 3.6 10*3/uL (ref 1.0–3.6)
Lymphocytes Relative: 32 %
MCH: 28.9 pg (ref 26.0–34.0)
MCHC: 34 g/dL (ref 32.0–36.0)
MCV: 85.1 fL (ref 80.0–100.0)
MONO ABS: 1 10*3/uL (ref 0.2–1.0)
MONOS PCT: 9 %
Neutro Abs: 6.4 10*3/uL (ref 1.4–6.5)
Neutrophils Relative %: 56 %
Platelets: 242 10*3/uL (ref 150–440)
RBC: 5.14 MIL/uL (ref 4.40–5.90)
RDW: 13.1 % (ref 11.5–14.5)
WBC: 11.3 10*3/uL — ABNORMAL HIGH (ref 3.8–10.6)

## 2017-08-08 MED ORDER — HALOPERIDOL DECANOATE 100 MG/ML IM SOLN: 200.0000 mg | INTRAMUSCULAR | 1 refills | Status: AC

## 2017-08-08 MED ORDER — DIVALPROEX SODIUM 500 MG PO DR TAB: 500.0000 mg | DELAYED_RELEASE_TABLET | Freq: Two times a day (BID) | ORAL | 1 refills | Status: AC

## 2017-08-08 MED ORDER — CLOZAPINE 100 MG PO TABS: 100.0000 mg | ORAL_TABLET | Freq: Every day | ORAL | 1 refills | Status: AC

## 2017-08-08 NOTE — Progress Notes (Signed)
  [facility] Adult Case Management Discharge Plan :  Will you be returning to the same living situation after discharge:  Yes,  Pt will be returning to his apartment. At discharge, do you have transportation home?: Yes,  Pt will be return home via a taxi. Do you have the ability to pay for your medications: Yes,  Pt has financial means to pay for his medications.  Release of information consent forms completed and in the chart;  Patient's signature needed at discharge.  Patient to Follow up at: Follow-up [place]. Follow up on 08/11/2017.   Why:  You will meet with Thressa Sheller, the ACT Team Leader, at 11:30AM at the office. Kaitlin with [facility] will also be following up with you on 08/11/17.  You can contact her at [phone]. Contact information: [place] [address] [phone]           Next level of care provider has access to [place] and Suicide Prevention discussed: Yes,  No safety issues noted.  Have you used any form of tobacco in the last 30 days? (Cigarettes, Smokeless Tobacco, Cigars, and/or Pipes): No  Has patient been referred to the Quitline?: N/A patient is not a smoker  Patient has been referred for addiction treatment: [place], LCSW 08/08/2017, 12:45 PM

## 2017-08-08 NOTE — BHH Suicide Risk Assessment (Signed)
[facility] Discharge Suicide Risk Assessment   Principal Problem: Undifferentiated schizophrenia [facility]) Discharge Diagnoses:  Patient Active Problem List   Diagnosis Date Noted  . Undifferentiated schizophrenia ([place]) [F20.3] 11/04/2016    Priority: High  . Noncompliance with treatment [Z91.19] 04/14/2015    Total Time spent with patient: 35  Musculoskeletal: Strength & Muscle Tone: within normal limits Gait & Station: normal Patient leans: N/A  Psychiatric Specialty Exam: Review of Systems  Neurological: Negative.   Psychiatric/Behavioral: Negative.   All other systems reviewed and are negative.   Blood pressure 114/82, pulse 93, temperature 97.9 F (36.6 C), temperature source Oral, resp. rate 18, height 5\' 6"  (1.676 m), weight 93.9 kg (207 lb), SpO2 98 %.Body mass index is 33.41 kg/m.  General Appearance: Casual  Eye Contact::  Good  Speech:  Clear and Coherent409  Volume:  Normal  Mood:  Euthymic  Affect:  Blunt  Thought Process:  Goal Directed and Descriptions of Associations: Intact  Orientation:  Full (Time, Place, and Person)  Thought Content:  WDL  Suicidal Thoughts:  No  Homicidal Thoughts:  No  Memory:  Immediate;   Fair Recent;   Fair Remote;   Fair  Judgement:  Poor  Insight:  Lacking  Psychomotor Activity:  Normal  Concentration:  Fair  Recall:  [place]  Language: Fair  Akathisia:  No  Handed:  Right  AIMS (if indicated):     Assets:  Communication Skills Desire for Improvement Financial Resources/Insurance Housing Physical Health Resilience Social Support  Sleep:  Number of Hours: 7.5  Cognition: WNL  ADL's:  Intact   Mental Status Per Nursing Assessment::   On Admission:  NA  Demographic Factors:  Male and Living alone  Loss Factors: NA  Historical Factors: Impulsivity  Risk Reduction Factors:   Sense of responsibility to family and Positive social support  Continued Clinical Symptoms:  Schizophrenia:    Paranoid or undifferentiated type  Cognitive Features That Contribute To Risk:  None    Suicide Risk:  Minimal: No identifiable suicidal ideation.  Patients presenting with no risk factors but with morbid ruminations; may be classified as minimal risk based on the severity of the depressive symptoms  Follow-up [place]. Follow up on 08/11/2017.   Why:  You will meet with Thressa Sheller, the ACT Team Leader, at 11:30AM at the office. Kaitlin with [facility] will also be following up with you on 08/11/17.  You can contact her at [phone]. Contact information: [place] [address] [phone]           Plan Of Care/Follow-up recommendations:  Activity:  as tolerated Diet:  low sodium heart healthy Other:  keep follow up appointments  Orson Slick, MD 08/08/2017, 1:11 PM

## 2017-08-08 NOTE — BHH Group Notes (Signed)
LCSW Group Therapy Note  08/08/2017 1:00 pm  Type of Therapy and Topic:  Group Therapy:  Feelings around Relapse and Recovery  Participation Level:  Did Not Attend   Description of Group:    Patients in this group will discuss emotions they experience before and after a relapse. They will process how experiencing these feelings, or avoidance of experiencing them, relates to having a relapse. Facilitator will guide patients to explore emotions they have related to recovery. Patients will be encouraged to process which emotions are more powerful. They will be guided to discuss the emotional reaction significant others in their lives may have to their relapse or recovery. Patients will be assisted in exploring ways to respond to the emotions of others without this contributing to a relapse.  Therapeutic Goals: 1. Patient will identify two or more emotions that lead to a relapse for them 2. Patient will identify two emotions that result when they relapse 3. Patient will identify two emotions related to recovery 4. Patient will demonstrate ability to communicate their needs through discussion and/or role plays   Summary of Patient Progress:     Therapeutic Modalities:   Cognitive Behavioral Therapy Solution-Focused Therapy Assertiveness Training Relapse Prevention Therapy   Devona Konig, LCSW 08/08/2017 3:26 PM

## 2017-08-08 NOTE — Consult Note (Signed)
[place] for Clozapine labs monitoring and REMs program reporting   Patient Measurements: Height: 5\' 6"  (167.6 cm) Weight: 207 lb (93.9 kg) IBW/kg (Calculated) : 63.8  Vital Signs: Temp: 97.9 F (36.6 C) (03/01 0638) Temp Source: Oral (03/01 5277) BP: 114/82 (03/01 8242) Pulse Rate: 93 (03/01 3536) Intake/Output from previous day: 02/28 0701 - 03/01 0700 In: 480 [P.O.:480] Out: -  Intake/Output from this shift: No intake/output data recorded.  Labs: Recent Labs    08/08/17 0710  WBC 11.3*  HGB 14.9  HCT 43.8  PLT 242   CrCl cannot be calculated (Patient's most recent lab result is older than the maximum 21 days allowed.).  Medical History: Past Medical History:  Diagnosis Date  . Anxiety   . Generalized anxiety disorder   . Schizophrenia [facility])     Assessment: Pharmacy consulted for clozapine lab monitoring and REMS Reporting in 52 yo male with PMH of schizophrenia.   Plan: Monitor ANC weekly  2/16  [place] 4200.  2/22  ANC 3900 3/1    ANC 6400  Labs reported to registry. Will continue to check weekly.  Next labs due 3/8.   Rocky Morel, RPh  08/08/2017 10:18 AM

## 2017-08-08 NOTE — Progress Notes (Signed)
Patient up in the hallway upon my arrival to the unit. Walking with a limp, when asked what was wrong, patient replied, "My gout is acting up." Declined to take any pain medication. Patient ate breakfast and went back to bed. Will continue to monitor and report any changes to the MD.

## 2017-08-08 NOTE — Discharge Summary (Signed)
Physician Discharge Summary Note  Patient:  Benjamin Braun is an 52 y.o., male MRN:  [id number] DOB:  May 11, 1966 Patient phone:  [phone] (home)  Patient address:   [address] [place] [address],  Total Time spent with patient: 31  Date of Admission:  07/16/2017 Date of Discharge: 08/08/2017  Reason for Admission:  Psychotic break.  History of Present Illness:    Identifying data. Mr. Polidori is a 52 year old male with a history of schizophrenia.  Chief complaint. The patient is mumbling about [employer].  History of present illness. Information was obtained from the patient and the chart. The patient was brought to the ER by the police after the patient became floridly psychotic, paranoid, hallucinating, calling police repeatedly. This is the usual scenario frequently resulting in patient resisting arrest and legal charges pending. This is always in the context of treatment noncompliance. The patient was hospitalized at [facility] less than a month ago and were to follow up with ACT team. It is unclear if he did. He is very disorganized in his thinking with pressured speech. He is short of hearing which makes conversation even more difficult. There is no history of substance abuse.  Past psychiatric history. Long history of mental illness with multiple hospitalizations and medication trials. History of treatment noncompliance.  Family psychiatric history. None.  Social history. He has been living independently in the same apartment for over 10 years with a fish and a dog. He has a supportive brother.  Principal Problem: Undifferentiated schizophrenia [facility]) Discharge Diagnoses: Patient Active Problem List   Diagnosis Date Noted  . Undifferentiated schizophrenia ([place]) [F20.3] 11/04/2016    Priority: High  . Noncompliance with treatment [Z91.19] 04/14/2015   Past Medical History:  Past Medical History:  Diagnosis Date  . Anxiety   . Generalized  anxiety disorder   . Schizophrenia [facility])     Past Surgical History:  Procedure Laterality Date  . TONSILLECTOMY     Family History:  Family History  Family history unknown: Yes    Social History:  Social History   Substance and Sexual Activity  Alcohol Use No  . Alcohol/week: 0.0 oz     Social History   Substance and Sexual Activity  Drug Use No    Social History   Socioeconomic History  . Marital status: Single    Spouse name: None  . Number of children: None  . Years of education: None  . Highest education level: None  Social Needs  . Financial resource strain: None  . Food insecurity - worry: None  . Food insecurity - inability: None  . Transportation needs - medical: None  . Transportation needs - non-medical: None  Occupational History  . None  Tobacco Use  . Smoking status: Former Smoker    Types: Cigarettes    Last attempt to quit: 06/11/1999    Years since quitting: 18.1  . Smokeless tobacco: Never Used  Substance and Sexual Activity  . Alcohol use: No    Alcohol/week: 0.0 oz  . Drug use: No  . Sexual activity: No  Other Topics Concern  . None  Social History Narrative  . None    Hospital Course:    Mr. Gahm is a 52 year old male with a history of schizophrenia admitted floridly psychotic in the context of medication noncompliance.His recovery was very slow. Unfortunately, he is unable to tolerate higher that 100 mg nightly dose of Clozapine due to excessive sedation.   #Psychosis, resolved -we increasedHaldol decanoateto 200  mgmonthly, next injection on3/28 -continueDepakote500 mg BID. VPA level 58 on 2/26 -continue clozapine to100 mg nightly -on 250 mg of Clozapine his level was 213  #Metabolic syndrome monitoring -Lipid panel, TSH and HgbA1C were recently done -EKG, QTc 435  #Admission status -IVC  #Disposition -discharge to his apartment -followup with [facility] ACT teamon 90 days of outpatient  commitment  Physical Findings: AIMS: Facial and Oral Movements Muscles of Facial Expression: None, normal Lips and Perioral Area: None, normal Jaw: None, normal Tongue: None, normal,Extremity Movements Upper (arms, wrists, hands, fingers): None, normal Lower (legs, knees, ankles, toes): None, normal, Trunk Movements Neck, shoulders, hips: None, normal, Overall Severity Severity of abnormal movements (highest score from questions above): None, normal Incapacitation due to abnormal movements: None, normal Patient's awareness of abnormal movements (rate only patient's report): No Awareness, Dental Status Current problems with teeth and/or dentures?: Yes(Poor dentition) Does patient usually wear dentures?: No  CIWA:    COWS:     Musculoskeletal: Strength & Muscle Tone: within normal limits Gait & Station: normal Patient leans: N/A  Psychiatric Specialty Exam: Physical Exam  Nursing note and vitals reviewed. Psychiatric: His affect is blunt. His speech is slurred. He is withdrawn. Thought content is paranoid. Cognition and memory are normal. He expresses impulsivity.    Review of Systems  Neurological: Negative.   Psychiatric/Behavioral: Negative.   All other systems reviewed and are negative.   Blood pressure 114/82, pulse 93, temperature 97.9 F (36.6 C), temperature source Oral, resp. rate 18, height 5\' 6"  (1.676 m), weight 93.9 kg (207 lb), SpO2 98 %.Body mass index is 33.41 kg/m.  General Appearance: Casual  Eye Contact:  Good  Speech:  Clear and Coherent  Volume:  Normal  Mood:  Euthymic  Affect:  Blunt  Thought Process:  Goal Directed and Descriptions of Associations: Intact  Orientation:  Full (Time, Place, and Person)  Thought Content:  Paranoid Ideation  Suicidal Thoughts:  No  Homicidal Thoughts:  No  Memory:  Immediate;   Fair Recent;   Fair Remote;   Fair  Judgement:  Poor  Insight:  Lacking  Psychomotor Activity:  Normal  Concentration:  Concentration:  Fair and Attention Span: Fair  Recall:  [employer] of Knowledge:  Fair  Language:  Fair  Akathisia:  Negative  Handed:  Right  AIMS (if indicated):     Assets:  Communication Skills Desire for Improvement Financial Resources/Insurance Housing Physical Health Resilience Social Support  ADL's:  Intact  Cognition:  WNL  Sleep:  Number of Hours: 7.5     Have you used any form of tobacco in the last 30 days? (Cigarettes, Smokeless Tobacco, Cigars, and/or Pipes): No  Has this patient used any form of tobacco in the last 30 days? (Cigarettes, Smokeless Tobacco, Cigars, and/or Pipes) Yes, No  Blood Alcohol level:  Lab Results  Component Value Date   ETH <10 07/15/2017   ETH <10 08/65/7846    Metabolic Disorder Labs:  Lab Results  Component Value Date   HGBA1C 5.4 06/19/2017   MPG 108.28 06/19/2017   MPG 111 11/05/2016   No results found for: PROLACTIN Lab Results  Component Value Date   CHOL 240 (H) 06/19/2017   TRIG 290 (H) 06/19/2017   HDL 36 (L) 06/19/2017   CHOLHDL 6.7 06/19/2017   VLDL 58 (H) 06/19/2017   LDLCALC 146 (H) 06/19/2017   LDLCALC 115 (H) 11/05/2016    See Psychiatric Specialty Exam and Suicide Risk Assessment completed by Attending Physician  prior to discharge.  Discharge destination:  Home  Is patient on multiple antipsychotic therapies at discharge:  Yes,   Do you recommend tapering to monotherapy for antipsychotics?  Yes   Has Patient had three or more failed trials of antipsychotic monotherapy by history:  Yes,   Antipsychotic medications that previously failed include:   1.  haldol., 2.  zyprexa. and 3.  risperdal.  Recommended Plan for Multiple Antipsychotic Therapies: Second antipsychotic is Clozapine.  Reason for adding Clozapine inadequate response to a single agent  Discharge Instructions    Diet - low sodium heart healthy   Complete by:  As directed    Increase activity slowly   Complete by:  As directed      Allergies as of  08/08/2017   No Known Allergies     Medication List    STOP taking these medications   OLANZapine 15 MG tablet Commonly known as:  ZYPREXA     TAKE these medications     Indication  cloZAPine 100 MG tablet Commonly known as:  CLOZARIL Take 1 tablet (100 mg total) by mouth at bedtime.  Indication:  Schizophrenia that does Not Respond to Usual Drug Therapy   divalproex 500 MG DR tablet Commonly known as:  DEPAKOTE Take 1 tablet (500 mg total) by mouth every 12 (twelve) hours.  Indication:  Schizophrenia   haloperidol decanoate 100 MG/ML injection Commonly known as:  HALDOL DECANOATE Inject 2 mLs (200 mg total) into the muscle every 28 (twenty-eight) days. Next injection on 09/04/2017 Start taking on:  09/04/2017 What changed:    how much to take  when to take this  additional instructions  Indication:  [place]. Follow up on 08/11/2017.   Why:  You will meet with Thressa Sheller, the ACT Team Leader, at 11:30AM at the office. Kaitlin with [facility] will also be following up with you on 08/11/17.  You can contact her at [phone]. Contact information: [place] [address] [phone]           Follow-up recommendations:  Activity:  as tolerated Diet:  low sodium hear healthy Other:  keep follow up appointments  Comments:     Signed: Orson Slick, MD 08/08/2017, 1:14 PM

## 2017-08-08 NOTE — Plan of Care (Addendum)
Patient found asleep in bed upon my arrival. Patient is neither visible nor social this evening. Patient reports he had not eaten all day. After review of his check sheet, it was confirmed that he had not eaten since lunch on 2/27. Provided sandwich tray and juice. Patient reports his drooling has decreased immensely. Patient states, "Thank you for telling them. It was really bad." Patient reports "pain in my toe, I need antibiotics." Patient toes do not appear infected. Patient offered Tylenol but declined. Denies SI, HI, AVH.  Reports feeling ready for discharge. Continues to complain of hypersomnolence. Patient returned to sleep after taking medications, participating in assessment, and eating his sandwich. Q 15 minute checks maintained. Will continue to monitor throughout the shift.  Patient slept 7.5 hours. No apparent distress. Patient reports his feet are painful 7/10, given Tylenol. Will endorse care to oncoming shift.   Progressing Education: Knowledge of the prescribed therapeutic regimen will improve 08/08/2017 0153 - Progressing by Derek Mound, RN Coping: Ability to cope will improve 08/08/2017 0153 - Progressing by Derek Mound, RN Ability to verbalize feelings will improve 08/08/2017 0153 - Progressing by Derek Mound, RN Safety: Ability to redirect hostility and anger into socially appropriate behaviors will improve 08/08/2017 0153 - Progressing by Derek Mound, RN Self-Concept: Ability to verbalize positive feelings about self will improve 08/08/2017 0153 - Progressing by Derek Mound, RN Self-Concept: Level of anxiety will decrease 08/08/2017 0153 - Progressing by Derek Mound, RN

## 2017-08-08 NOTE — Progress Notes (Signed)
Patient discharged on above date and time. All discharge paperwork signed, patient verbally stated that he understood the information presented to him.. Verbally denies SI. Patient left with discharge paperwork, personal items and prescriptions. Transported home via cab, taxi voucher provided on patient's behalf.

## 2017-08-08 NOTE — BHH Group Notes (Signed)
[place] Group Notes:  (Nursing/MHT/Case Management/Adjunct)  Date:  08/08/2017  Time:  3:29 PM  Type of Therapy:  Psychoeducational Skills  Adela Lank [facility] 08/08/2017, 3:29 PM

## 2017-08-08 NOTE — Plan of Care (Signed)
Patient remains isolative to his room, sleeps all day. Attended breakfast this morning with minimal peer interaction noted. Does not participate by attending groups.

## 2017-08-08 NOTE — Progress Notes (Signed)
Patient ID: Benjamin Braun, male   DOB: [birth year], 52 y.o.   MRN: [id number] CSW participated in meeting with pt and Thressa Sheller, [facility] ACT Team Lead.  [name] wanted to meet with pt to assess if he is ready to be discharged back to the community and could be supported in the community with ACT services.  [name] went over what happened the last time that she tried to meet with him at her office and initiate services with him.  Ms Truman Hayward asked pt if he was going to have a problem coming to the office, meeting with the team twice and week, opening up his door to the team, and show more respect to the team's psychiatrist.  Pt was somewhat disorganized in his thoughts, but agreed that he would be "nicer" and work with the team if he could be discharged.   [name] shared with CSW that she would like to work with pt, but still needed to follow-up with the team's psychiatrist to make sure that she does not have a problem working with pt again.  [name] informed CSW that she would meet with pt on Monday, August 11, 2017 at 11:30 AM at her office if he is discharged over the weekend.  [name] shared her concern with pt either not showing up for the appointment or not allow the team into his home due to his paranoia.  CSW agreed to provide information for [place] for medication management if pt is not able to participate in ACT services. [name] shared that she would assist pt with getting established with [facility] if needed. CSW followed up with pt's psychiatrist, [name] ([name]),  to inform her that pt has an appointment to meet with the ACTT next week.  [name] agreed that pt could be discharged today.

## 2017-11-17 DIAGNOSIS — Z602 Problems related to living alone: Secondary | ICD-10-CM | POA: Diagnosis not present

## 2017-11-17 DIAGNOSIS — Z6833 Body mass index (BMI) 33.0-33.9, adult: Secondary | ICD-10-CM | POA: Diagnosis not present

## 2017-11-17 DIAGNOSIS — Z720 Tobacco use: Secondary | ICD-10-CM | POA: Diagnosis not present

## 2017-11-17 DIAGNOSIS — K029 Dental caries, unspecified: Secondary | ICD-10-CM | POA: Diagnosis not present

## 2017-11-17 DIAGNOSIS — Z653 Problems related to other legal circumstances: Secondary | ICD-10-CM | POA: Diagnosis not present

## 2017-11-17 DIAGNOSIS — R351 Nocturia: Secondary | ICD-10-CM | POA: Diagnosis not present

## 2017-11-17 DIAGNOSIS — Z658 Other specified problems related to psychosocial circumstances: Secondary | ICD-10-CM | POA: Diagnosis not present

## 2017-11-17 DIAGNOSIS — K5900 Constipation, unspecified: Secondary | ICD-10-CM | POA: Diagnosis not present

## 2017-11-17 DIAGNOSIS — Z608 Other problems related to social environment: Secondary | ICD-10-CM | POA: Diagnosis not present

## 2017-11-17 DIAGNOSIS — E785 Hyperlipidemia, unspecified: Secondary | ICD-10-CM | POA: Diagnosis not present

## 2017-11-17 DIAGNOSIS — Z609 Problem related to social environment, unspecified: Secondary | ICD-10-CM | POA: Diagnosis not present

## 2017-11-17 DIAGNOSIS — M25511 Pain in right shoulder: Secondary | ICD-10-CM | POA: Diagnosis not present

## 2017-11-17 DIAGNOSIS — H9193 Unspecified hearing loss, bilateral: Secondary | ICD-10-CM | POA: Diagnosis not present

## 2017-11-17 DIAGNOSIS — F209 Schizophrenia, unspecified: Secondary | ICD-10-CM | POA: Diagnosis not present

## 2017-11-17 DIAGNOSIS — E669 Obesity, unspecified: Secondary | ICD-10-CM | POA: Diagnosis not present

## 2017-11-17 DIAGNOSIS — Z599 Problem related to housing and economic circumstances, unspecified: Secondary | ICD-10-CM | POA: Diagnosis not present

## 2018-06-22 DIAGNOSIS — Z1159 Encounter for screening for other viral diseases: Secondary | ICD-10-CM | POA: Diagnosis not present

## 2018-06-22 DIAGNOSIS — Z23 Encounter for immunization: Secondary | ICD-10-CM | POA: Diagnosis not present

## 2018-06-22 DIAGNOSIS — M2550 Pain in unspecified joint: Secondary | ICD-10-CM | POA: Diagnosis not present

## 2018-06-22 DIAGNOSIS — E781 Pure hyperglyceridemia: Secondary | ICD-10-CM | POA: Diagnosis not present

## 2018-06-22 DIAGNOSIS — F209 Schizophrenia, unspecified: Secondary | ICD-10-CM | POA: Diagnosis not present

## 2018-06-22 DIAGNOSIS — Z1211 Encounter for screening for malignant neoplasm of colon: Secondary | ICD-10-CM | POA: Diagnosis not present

## 2018-08-03 DIAGNOSIS — Z1389 Encounter for screening for other disorder: Secondary | ICD-10-CM | POA: Diagnosis not present

## 2018-08-03 DIAGNOSIS — R760 Raised antibody titer: Secondary | ICD-10-CM | POA: Diagnosis not present

## 2018-08-03 DIAGNOSIS — F209 Schizophrenia, unspecified: Secondary | ICD-10-CM | POA: Diagnosis not present

## 2018-08-03 DIAGNOSIS — M2550 Pain in unspecified joint: Secondary | ICD-10-CM | POA: Diagnosis not present

## 2018-08-03 DIAGNOSIS — R7989 Other specified abnormal findings of blood chemistry: Secondary | ICD-10-CM | POA: Diagnosis not present

## 2018-08-03 DIAGNOSIS — E781 Pure hyperglyceridemia: Secondary | ICD-10-CM | POA: Diagnosis not present

## 2018-12-09 DIAGNOSIS — F209 Schizophrenia, unspecified: Secondary | ICD-10-CM | POA: Diagnosis not present

## 2018-12-09 DIAGNOSIS — F411 Generalized anxiety disorder: Secondary | ICD-10-CM | POA: Diagnosis not present

## 2018-12-16 DIAGNOSIS — F209 Schizophrenia, unspecified: Secondary | ICD-10-CM | POA: Diagnosis not present

## 2018-12-23 DIAGNOSIS — F209 Schizophrenia, unspecified: Secondary | ICD-10-CM | POA: Diagnosis not present

## 2019-01-06 DIAGNOSIS — F209 Schizophrenia, unspecified: Secondary | ICD-10-CM | POA: Diagnosis not present

## 2019-01-20 DIAGNOSIS — F209 Schizophrenia, unspecified: Secondary | ICD-10-CM | POA: Diagnosis not present

## 2019-02-17 DIAGNOSIS — F202 Catatonic schizophrenia: Secondary | ICD-10-CM | POA: Diagnosis not present

## 2019-03-08 DIAGNOSIS — H25813 Combined forms of age-related cataract, bilateral: Secondary | ICD-10-CM | POA: Diagnosis not present

## 2019-03-29 DIAGNOSIS — E781 Pure hyperglyceridemia: Secondary | ICD-10-CM | POA: Diagnosis not present

## 2019-03-29 DIAGNOSIS — F209 Schizophrenia, unspecified: Secondary | ICD-10-CM | POA: Diagnosis not present

## 2019-04-20 DIAGNOSIS — F209 Schizophrenia, unspecified: Secondary | ICD-10-CM | POA: Diagnosis not present

## 2019-04-20 DIAGNOSIS — M545 Low back pain: Secondary | ICD-10-CM | POA: Diagnosis not present

## 2019-05-12 DIAGNOSIS — F209 Schizophrenia, unspecified: Secondary | ICD-10-CM | POA: Diagnosis not present

## 2019-12-28 ENCOUNTER — Other Ambulatory Visit: Payer: Self-pay

## 2020-03-14 ENCOUNTER — Ambulatory Visit: Payer: Medicare Other | Admitting: Gastroenterology

## 2020-03-14 ENCOUNTER — Encounter: Payer: Self-pay | Admitting: Gastroenterology

## 2021-05-08 ENCOUNTER — Other Ambulatory Visit: Payer: Self-pay

## 2021-05-08 ENCOUNTER — Ambulatory Visit (INDEPENDENT_AMBULATORY_CARE_PROVIDER_SITE_OTHER): Payer: Medicare Other | Admitting: Podiatry

## 2021-05-08 DIAGNOSIS — M79675 Pain in left toe(s): Secondary | ICD-10-CM

## 2021-05-08 DIAGNOSIS — M79674 Pain in right toe(s): Secondary | ICD-10-CM

## 2021-05-08 DIAGNOSIS — B351 Tinea unguium: Secondary | ICD-10-CM | POA: Diagnosis not present

## 2021-05-08 DIAGNOSIS — E119 Type 2 diabetes mellitus without complications: Secondary | ICD-10-CM

## 2021-05-08 NOTE — Progress Notes (Signed)
Subjective: Benjamin Braun presents today referred by Patient, No Pcp Per (Inactive) for diabetic foot evaluation.  Patient relates less than a year year history of diabetes.  Patient denies any history of foot wounds.  Patient denies any history of numbness, tingling, burning, pins/needles sensations.  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     Blood Glucose Monitoring Suppl (ACCU-CHEK AVIVA PLUS) w/Device KIT Use 1 device as directed once a day  for glucose monitoring--DX E11.65     cloZAPine (CLOZARIL) 100 MG tablet Take 1 tablet (100 mg total) by mouth at bedtime. 30 tablet 1   divalproex (DEPAKOTE) 500 MG DR tablet Take 1 tablet (500 mg total) by mouth every 12 (twelve) hours. 60 tablet 1   haloperidol (HALDOL) 10 MG tablet Take 5 mg by mouth daily.     haloperidol decanoate (HALDOL DECANOATE) 100 MG/ML injection Inject 2 mLs (200 mg total) into the muscle every 28 (twenty-eight) days. Next injection on 09/04/2017 1 mL 1   JANUMET XR 50-500 MG TB24 Take 1 tablet by mouth daily.     Lancets Misc. (ACCU-CHEK FASTCLIX LANCET) KIT Use 1 kit as directed once a day     No current facility-administered medications on file prior to visit.     No Known Allergies  Social History   Occupational History   Not on file  Tobacco Use   Smoking status: Former    Types: Cigarettes    Quit date: 06/11/1999    Years since quitting: 21.9   Smokeless tobacco: Never  Vaping Use   Vaping Use: Never used  Substance and Sexual Activity   Alcohol use: No    Alcohol/week:  0.0 standard drinks   Drug use: No   Sexual activity: Never    Family History  Family history unknown: Yes     There is no immunization history on file for this patient.  Review of systems: Positive Findings in bold print.  Constitutional:  chills, fatigue, fever, sweats, weight change Communication: Optometrist, sign Ecologist, hand writing, iPad/Android device Head: headaches, head injury Eyes: changes in vision, eye pain, glaucoma, cataracts, macular degeneration, diplopia, glare,  light sensitivity, eyeglasses or contacts, blindness Ears nose mouth throat: hearing impaired, hearing aids,  ringing in ears, deaf, sign language,  vertigo, nosebleeds,  rhinitis,  cold sores, snoring, swollen glands Cardiovascular: HTN, edema, arrhythmia, pacemaker in place, defibrillator in place, chest pain/tightness, chronic anticoagulation, blood clot, heart failure, MI Peripheral Vascular: leg cramps, varicose veins, blood clots, lymphedema, varicosities Respiratory:  asthma, difficulty breathing, denies congestion, SOB, wheezing, cough, emphysema Gastrointestinal: change in appetite or weight, abdominal pain, constipation, diarrhea, nausea, vomiting, vomiting blood, change in bowel habits, abdominal pain, jaundice, rectal bleeding, hemorrhoids, GERD Genitourinary:  nocturia,  pain on urination, polyuria,  blood in urine, Foley catheter, urinary urgency, ESRD on hemodialysis Musculoskeletal: amputation, cramping, stiff joints, painful joints, decreased joint motion, fractures, OA, gout, hemiplegia, paraplegia, uses cane, wheelchair bound, uses walker, uses rollator Skin: +changes in toenails, color change, dryness, itching, mole changes,  rash, wound(s)  Neurological: headaches, numbness in feet, paresthesias in feet, burning in feet, fainting,  seizures, change in speech, migraines, memory problems/poor historian, cerebral palsy, weakness, paralysis, CVA, TIA Endocrine: diabetes,  hypothyroidism, hyperthyroidism,  goiter, dry mouth, flushing, heat intolerance, cold intolerance,  excessive thirst, denies polyuria,  nocturia Hematological:  easy bleeding, excessive bleeding, easy bruising, enlarged lymph nodes, on long term blood thinner, history of past transusions Allergy/immunological:  hives, eczema, frequent infections, multiple drug allergies, seasonal allergies, transplant recipient, multiple food allergies Psychiatric:  anxiety, depression, mood disorder, suicidal ideations, hallucinations, insomnia  Objective: There were no vitals filed for this visit. Vascular Examination: Capillary refill time less than 3 seconds x 10 digits.  Dorsalis pedis pulses palpable 2 out of 4.  Posterior tibial pulses palpable 2 out of 4.  Digital hair not present x 10 digits.  Skin temperature gradient WNL b/l.  Dermatological Examination: Skin with normal turgor, texture and tone b/l  Toenails 1-5 b/l discolored, thick, dystrophic with subungual debris and pain with palpation to nailbeds due to thickness of nails.  Musculoskeletal: Muscle strength 5/5 to all LE muscle groups.  Neurological: Sensation intact with 10 gram monofilament.  Vibratory sensation intact.  Assessment: NIDDM Encounter for diabetic foot examination  Plan: Discussed diabetic foot care principles. Literature dispensed on today. Patient to continue soft, supportive shoe gear daily. Patient to report any pedal injuries to medical professional immediately. Follow up one year. Patient/POA to call should there be a concern in the interim. Nails were debrided down to healthy nail bed.  No complication noted.  Patient had immediate pain relief

## 2021-08-09 ENCOUNTER — Ambulatory Visit: Payer: Medicare Other | Admitting: Podiatry

## 2021-08-23 ENCOUNTER — Other Ambulatory Visit: Payer: Self-pay

## 2021-08-23 ENCOUNTER — Ambulatory Visit (INDEPENDENT_AMBULATORY_CARE_PROVIDER_SITE_OTHER): Payer: Medicare Other | Admitting: Podiatry

## 2021-08-23 ENCOUNTER — Encounter: Payer: Self-pay | Admitting: Podiatry

## 2021-08-23 DIAGNOSIS — M79675 Pain in left toe(s): Secondary | ICD-10-CM | POA: Diagnosis not present

## 2021-08-23 DIAGNOSIS — M79674 Pain in right toe(s): Secondary | ICD-10-CM | POA: Diagnosis not present

## 2021-08-23 DIAGNOSIS — B351 Tinea unguium: Secondary | ICD-10-CM | POA: Diagnosis not present

## 2021-08-23 NOTE — Progress Notes (Signed)
?  Subjective:  ?Patient ID: Benjamin Braun, male    DOB: 1965-08-18,  MRN: [id number] ? ?Chief Complaint  ?Patient presents with  ? Nail Problem  ? Diabetes  ?  Nail trim [facility]  ? ?56 y.o. male returns for the above complaint.  Patient presents with thickened elongated dystrophic toenails x10.  Mild pain on palpation.  Patient would like for me to review that he is not able to do it himself.  He is a diabetic with last A1c of 5.4 ? ?Objective:  ?There were no vitals filed for this visit. ?Podiatric Exam: ?Vascular: dorsalis pedis and posterior tibial pulses are palpable bilateral. Capillary return is immediate. Temperature gradient is WNL. Skin turgor WNL  ?Sensorium: Normal Semmes Weinstein monofilament test. Normal tactile sensation bilaterally. ?Nail Exam: Pt has thick disfigured discolored nails with subungual debris noted bilateral entire nail hallux through fifth toenails.  Pain on palpation to the nails. ?Ulcer Exam: There is no evidence of ulcer or pre-ulcerative changes or infection. ?Orthopedic Exam: Muscle tone and strength are WNL. No limitations in general ROM. No crepitus or effusions noted.  ?Skin: No Porokeratosis. No infection or ulcers ? ? ? ?Assessment & Plan:  ? ?1. Pain due to onychomycosis of toenails of both feet   ? ? ?Patient was evaluated and treated and all questions answered. ? ?Onychomycosis with pain  ?-Nails palliatively debrided as below. ?-Educated on self-care ? ?Procedure: Nail Debridement ?Rationale: pain  ?Type of Debridement: manual, sharp debridement. ?Instrumentation: Nail nipper, rotary burr. ?Number of Nails: 10 ? ?Procedures and Treatment: Consent by patient was obtained for treatment procedures. The patient understood the discussion of treatment and procedures well. All questions were answered thoroughly reviewed. Debridement of mycotic and hypertrophic toenails, 1 through 5 bilateral and clearing of subungual debris. No ulceration, no infection noted.  ?Return  Visit-Office Procedure: Patient instructed to return to the office for a follow up visit 3 months for continued evaluation and treatment. ? ?Boneta Lucks, DPM ?  ? ?No follow-ups on file. ? ?

## 2021-11-27 ENCOUNTER — Ambulatory Visit: Payer: Medicare Other | Admitting: Podiatry

## 2022-06-25 ENCOUNTER — Encounter: Payer: Self-pay | Admitting: *Deleted

## 2022-06-25 ENCOUNTER — Ambulatory Visit: Payer: 59 | Admitting: Anesthesiology

## 2022-06-25 ENCOUNTER — Ambulatory Visit
Admission: RE | Admit: 2022-06-25 | Discharge: 2022-06-25 | Disposition: A | Discharge status: Home / Self Care | Payer: 59 | Attending: Gastroenterology | Admitting: Gastroenterology

## 2022-06-25 ENCOUNTER — Encounter
Admission: RE | Disposition: A | Discharge status: Home / Self Care | Payer: Self-pay | Source: Home / Self Care | Attending: Gastroenterology

## 2022-06-25 ENCOUNTER — Other Ambulatory Visit: Payer: Self-pay

## 2022-06-25 DIAGNOSIS — F419 Anxiety disorder, unspecified: Secondary | ICD-10-CM | POA: Insufficient documentation

## 2022-06-25 DIAGNOSIS — Z1211 Encounter for screening for malignant neoplasm of colon: Secondary | ICD-10-CM | POA: Diagnosis not present

## 2022-06-25 DIAGNOSIS — K640 First degree hemorrhoids: Secondary | ICD-10-CM | POA: Insufficient documentation

## 2022-06-25 DIAGNOSIS — Z7984 Long term (current) use of oral hypoglycemic drugs: Secondary | ICD-10-CM | POA: Diagnosis not present

## 2022-06-25 DIAGNOSIS — Z6837 Body mass index (BMI) 37.0-37.9, adult: Secondary | ICD-10-CM | POA: Insufficient documentation

## 2022-06-25 DIAGNOSIS — D123 Benign neoplasm of transverse colon: Secondary | ICD-10-CM | POA: Insufficient documentation

## 2022-06-25 DIAGNOSIS — R195 Other fecal abnormalities: Secondary | ICD-10-CM | POA: Insufficient documentation

## 2022-06-25 DIAGNOSIS — E669 Obesity, unspecified: Secondary | ICD-10-CM | POA: Insufficient documentation

## 2022-06-25 DIAGNOSIS — E119 Type 2 diabetes mellitus without complications: Secondary | ICD-10-CM | POA: Insufficient documentation

## 2022-06-25 DIAGNOSIS — D128 Benign neoplasm of rectum: Secondary | ICD-10-CM | POA: Insufficient documentation

## 2022-06-25 DIAGNOSIS — K635 Polyp of colon: Secondary | ICD-10-CM | POA: Insufficient documentation

## 2022-06-25 HISTORY — PX: COLONOSCOPY WITH PROPOFOL: SHX5780

## 2022-06-25 LAB — GLUCOSE, CAPILLARY: Glucose-Capillary: 142 mg/dL — ABNORMAL HIGH (ref 70–99)

## 2022-06-25 SURGERY — COLONOSCOPY WITH PROPOFOL
Anesthesia: General

## 2022-06-25 MED ORDER — LIDOCAINE HCL (PF) 2 % IJ SOLN
INTRAMUSCULAR | Status: AC
  Filled 2022-06-25: qty 5

## 2022-06-25 MED ORDER — STERILE WATER FOR IRRIGATION IR SOLN
Status: DC | PRN
  Administered 2022-06-25: 60 mL

## 2022-06-25 MED ORDER — PROPOFOL 10 MG/ML IV BOLUS
INTRAVENOUS | Status: DC | PRN
  Administered 2022-06-25 (×2): 40 mg via INTRAVENOUS

## 2022-06-25 MED ORDER — SODIUM CHLORIDE 0.9 % IV SOLN: INTRAVENOUS | Status: DC

## 2022-06-25 MED ORDER — LIDOCAINE HCL (CARDIAC) PF 100 MG/5ML IV SOSY
PREFILLED_SYRINGE | INTRAVENOUS | Status: DC | PRN
  Administered 2022-06-25 (×2): 50 mg via INTRAVENOUS

## 2022-06-25 MED ORDER — PROPOFOL 500 MG/50ML IV EMUL
INTRAVENOUS | Status: DC | PRN
  Administered 2022-06-25: 125 ug/kg/min via INTRAVENOUS

## 2022-06-25 MED ORDER — DEXMEDETOMIDINE HCL IN NACL 80 MCG/20ML IV SOLN
INTRAVENOUS | Status: DC | PRN
  Administered 2022-06-25: 4 ug via BUCCAL
  Administered 2022-06-25: 8 ug via BUCCAL

## 2022-06-25 MED ORDER — PROPOFOL 10 MG/ML IV BOLUS
INTRAVENOUS | Status: AC
  Filled 2022-06-25: qty 40

## 2022-06-25 NOTE — Anesthesia Preprocedure Evaluation (Addendum)
Anesthesia Evaluation  Patient identified by MRN, date of birth, ID band Patient awake    Reviewed: Allergy & Precautions, H&P , NPO status , Patient's Chart, lab work & pertinent test results  Airway Mallampati: II  TM Distance: >3 FB Neck ROM: full    Dental no notable dental hx.    Pulmonary former smoker   Pulmonary exam normal        Cardiovascular negative cardio ROS Normal cardiovascular exam     Neuro/Psych  PSYCHIATRIC DISORDERS Anxiety   Schizophrenia  negative neurological ROS     GI/Hepatic negative GI ROS, Neg liver ROS,,,  Endo/Other  negative endocrine ROS    Renal/GU negative Renal ROS  negative genitourinary   Musculoskeletal   Abdominal  (+) + obese  Peds  Hematology negative hematology ROS (+)   Anesthesia Other Findings Past Medical History: No date: Anxiety No date: Generalized anxiety disorder No date: Schizophrenia [facility])  Past Surgical History: No date: TONSILLECTOMY     Reproductive/Obstetrics negative OB ROS                             Anesthesia Physical Anesthesia Plan  ASA: 2  Anesthesia Plan: General   Post-op Pain Management:    Induction: Intravenous  PONV Risk Score and Plan: Propofol infusion and TIVA  Airway Management Planned: Natural Airway  Additional Equipment:   Intra-op Plan:   Post-operative Plan:   Informed Consent: I have reviewed the patients History and Physical, chart, labs and discussed the procedure including the risks, benefits and alternatives for the proposed anesthesia with the patient or authorized representative who has indicated his/her understanding and acceptance.     Dental Advisory Given  Plan Discussed with: CRNA and Surgeon  Anesthesia Plan Comments:        Anesthesia Quick Evaluation

## 2022-06-25 NOTE — Transfer of Care (Signed)
Immediate Anesthesia Transfer of Care Note  Patient: Benjamin Braun  Procedure(s) Performed: COLONOSCOPY WITH PROPOFOL  Patient Location: PACU  Anesthesia Type:General  Level of Consciousness: drowsy  Airway & Oxygen Therapy: Patient Spontanous Breathing and Patient connected to face mask oxygen  Post-op Assessment: Report given to RN and Post -op Vital signs reviewed and stable  Post vital signs: Reviewed and stable  Last Vitals:  Vitals Value Taken Time  BP 142/97 06/25/22 1139  Temp 35.9 C 06/25/22 1139  Pulse 81 06/25/22 1141  Resp 15 06/25/22 1141  SpO2 98 % 06/25/22 1141  Vitals shown include unvalidated device data.  Last Pain:  Vitals:   06/25/22 1139  TempSrc: Temporal  PainSc: Asleep         Complications: No notable events documented.

## 2022-06-25 NOTE — Interval H&P Note (Signed)
History and Physical Interval Note:  06/25/2022 11:01 AM  Benjamin Braun  has presented today for surgery, with the diagnosis of HEME + STOOL.  The various methods of treatment have been discussed with the patient and family. After consideration of risks, benefits and other options for treatment, the patient has consented to  Procedure(s): COLONOSCOPY WITH PROPOFOL (N/A) as a surgical intervention.  The patient's history has been reviewed, patient examined, no change in status, stable for surgery.  I have reviewed the patient's chart and labs.  Questions were answered to the patient's satisfaction.     Lesly Rubenstein  Ok to proceed with colonoscopy

## 2022-06-25 NOTE — H&P (Signed)
Outpatient short stay form Pre-procedure 06/25/2022  Lesly Rubenstein, MD  Primary Physician: Waylan Rocher, MD  Reason for visit:  Heme positive stool  History of present illness:    57 y/o gentleman with history of schizophrenia, DM II, and obesity here for colonoscopy due to heme positive stool. No blood thinners. No family history of GI malignancies. Denies any abdominal surgeries.    Current Facility-Administered Medications:    0.9 %  sodium chloride infusion, , Intravenous, Continuous, Desira Alessandrini, Hilton Cork, MD  Medications Prior to Admission  Medication Sig Dispense Refill Last Dose   haloperidol decanoate (HALDOL DECANOATE) 100 MG/ML injection Inject 2 mLs (200 mg total) into the muscle every 28 (twenty-eight) days. Next injection on 09/04/2017 1 mL 1 Past Week   JANUMET XR 50-500 MG TB24 Take 1 tablet by mouth daily.   Past Week   ACCU-CHEK AVIVA PLUS test strip 1 each daily.      Blood Glucose Monitoring Suppl (ACCU-CHEK AVIVA PLUS) w/Device KIT Use 1 device as directed once a day  for glucose monitoring--DX E11.65      cloZAPine (CLOZARIL) 100 MG tablet Take 1 tablet (100 mg total) by mouth at bedtime. 30 tablet 1    divalproex (DEPAKOTE) 500 MG DR tablet Take 1 tablet (500 mg total) by mouth every 12 (twelve) hours. 60 tablet 1    haloperidol (HALDOL) 10 MG tablet Take 5 mg by mouth daily.      Lancets Misc. (ACCU-CHEK FASTCLIX LANCET) KIT Use 1 kit as directed once a day        Allergies  Allergen Reactions   Abilify [Aripiprazole] Other (See Comments)    Pt states medication gave him amnesia     Past Medical History:  Diagnosis Date   Anxiety    Generalized anxiety disorder    Schizophrenia ([place])     Review of systems:  Otherwise negative.    Physical Exam  Gen: Alert, oriented. Appears stated age.  HEENT: PERRLA. Lungs: No respiratory distress CV: RRR Abd: soft, benign, no masses Ext: No edema    Planned procedures: Proceed with  colonoscopy. The patient understands the nature of the planned procedure, indications, risks, alternatives and potential complications including but not limited to bleeding, infection, perforation, damage to internal organs and possible oversedation/side effects from anesthesia. The patient agrees and gives consent to proceed.  Please refer to procedure notes for findings, recommendations and patient disposition/instructions.     Lesly Rubenstein, MD [facility]

## 2022-06-25 NOTE — Op Note (Signed)
[facility] Patient Name: Benjamin Braun Procedure Date: 06/25/2022 10:47 AM MRN: [id number] Account #: [id number] Date of Birth: 07-May-1966 Admit Type: Outpatient Age: 57 Room: [facility] ENDO ROOM 1 Gender: Male Note Status: Finalized Instrument Name: Colonoscope [id number] Procedure:             Colonoscopy Indications:           Heme positive stool Providers:             Andrey Farmer MD, MD Medicines:             Monitored Anesthesia Care Complications:         No immediate complications. Estimated blood loss:                         Minimal. Procedure:             Pre-Anesthesia Assessment:                        - Prior to the procedure, a History and Physical was                         performed, and patient medications and allergies were                         reviewed. The patient is competent. The risks and                         benefits of the procedure and the sedation options and                         risks were discussed with the patient. All questions                         were answered and informed consent was obtained.                         Patient identification and proposed procedure were                         verified by the physician, the nurse, the                         anesthesiologist, the anesthetist and the technician                         in the endoscopy suite. Mental Status Examination:                         alert and oriented. Airway Examination: normal                         oropharyngeal airway and neck mobility. Respiratory                         Examination: clear to auscultation. CV Examination:                         normal. Prophylactic Antibiotics: The patient does not  require prophylactic antibiotics. Prior                         Anticoagulants: The patient has taken no anticoagulant                         or antiplatelet agents. ASA Grade Assessment: II - A                          patient with mild systemic disease. After reviewing                         the risks and benefits, the patient was deemed in                         satisfactory condition to undergo the procedure. The                         anesthesia plan was to use monitored anesthesia care                         (MAC). Immediately prior to administration of                         medications, the patient was re-assessed for adequacy                         to receive sedatives. The heart rate, respiratory                         rate, oxygen saturations, blood pressure, adequacy of                         pulmonary ventilation, and response to care were                         monitored throughout the procedure. The physical                         status of the patient was re-assessed after the                         procedure.                        After obtaining informed consent, the colonoscope was                         passed under direct vision. Throughout the procedure,                         the patient's blood pressure, pulse, and oxygen                         saturations were monitored continuously. The                         Colonoscope was introduced through the anus and  advanced to the the terminal ileum. The colonoscopy                         was performed without difficulty. The patient                         tolerated the procedure well. The quality of the bowel                         preparation was good. The terminal ileum, ileocecal                         valve, appendiceal orifice, and rectum were                         photographed. Findings:      The perianal and digital rectal examinations were normal.      The terminal ileum appeared normal.      Two sessile polyps were found in the transverse colon. The polyps were 2       to 4 mm in size. These polyps were removed with a cold snare. Resection       and retrieval were  complete. Estimated blood loss was minimal.      Two sessile polyps were found in the sigmoid colon. The polyps were 3 to       4 mm in size. These polyps were removed with a cold snare. Resection and       retrieval were complete. Estimated blood loss was minimal.      A 6 mm polyp was found in the rectum. The polyp was sessile. The polyp       was removed with a cold snare. Resection and retrieval were complete.       Estimated blood loss was minimal.      Internal hemorrhoids were found during retroflexion. The hemorrhoids       were Grade I (internal hemorrhoids that do not prolapse).      The exam was otherwise without abnormality on direct and retroflexion       views. Impression:            - The examined portion of the ileum was normal.                        - Two 2 to 4 mm polyps in the transverse colon,                         removed with a cold snare. Resected and retrieved.                        - Two 3 to 4 mm polyps in the sigmoid colon, removed                         with a cold snare. Resected and retrieved.                        - One 6 mm polyp in the rectum, removed with a cold                         snare. Resected  and retrieved.                        - Internal hemorrhoids.                        - The examination was otherwise normal on direct and                         retroflexion views. Recommendation:        - Discharge patient to home.                        - Resume previous diet.                        - Continue present medications.                        - Await pathology results.                        - Repeat colonoscopy in 3 years for surveillance.                        - Return to referring physician as previously                         scheduled. Procedure Code(s):     --- Professional ---                        [phone], Colonoscopy, flexible; with removal of                         tumor(s), polyp(s), or other lesion(s) by snare                          technique Diagnosis Code(s):     --- Professional ---                        K64.0, First degree hemorrhoids                        D12.3, Benign neoplasm of transverse colon (hepatic                         flexure or splenic flexure)                        D12.5, Benign neoplasm of sigmoid colon                        D12.8, Benign neoplasm of rectum                        R19.5, Other fecal abnormalities CPT copyright 2022 American Medical Association. All rights reserved. The codes documented in this report are preliminary and upon coder review may  be revised to meet current compliance requirements. Andrey Farmer MD, MD 06/25/2022 11:39:32 AM Number of Addenda: 0 Note Initiated On: 06/25/2022 10:47 AM Scope Withdrawal Time: 0 hours 15 minutes 35 seconds  Total Procedure Duration: 0  hours 19 minutes 51 seconds  Estimated Blood Loss:  Estimated blood loss was minimal.      [facility]

## 2022-06-26 ENCOUNTER — Encounter: Payer: Self-pay | Admitting: Gastroenterology

## 2022-06-26 LAB — SURGICAL PATHOLOGY

## 2022-06-27 NOTE — Anesthesia Postprocedure Evaluation (Signed)
Anesthesia Post Note  Patient: Benjamin Braun  Procedure(s) Performed: COLONOSCOPY WITH PROPOFOL  Patient location during evaluation: Endoscopy Anesthesia Type: General Level of consciousness: awake and alert Pain management: pain level controlled Vital Signs Assessment: post-procedure vital signs reviewed and stable Respiratory status: spontaneous breathing, nonlabored ventilation and respiratory function stable Cardiovascular status: blood pressure returned to baseline and stable Postop Assessment: no apparent nausea or vomiting Anesthetic complications: no   No notable events documented.   Last Vitals:  Vitals:   06/25/22 1149 06/25/22 1204  BP: 118/73 114/60  Pulse: 83 78  Resp: 18 16  Temp:    SpO2: 96% 96%    Last Pain:  Vitals:   06/25/22 1204  TempSrc:   PainSc: 0-No pain                 [place] Ouch

## 2022-10-12 ENCOUNTER — Other Ambulatory Visit: Payer: Self-pay

## 2022-10-12 ENCOUNTER — Encounter: Payer: Self-pay | Admitting: Intensive Care

## 2022-10-12 ENCOUNTER — Emergency Department
Admission: EM | Admit: 2022-10-12 | Discharge: 2022-10-12 | Disposition: A | Discharge status: Home / Self Care | Payer: 59 | Attending: Emergency Medicine | Admitting: Emergency Medicine

## 2022-10-12 ENCOUNTER — Emergency Department: Payer: 59

## 2022-10-12 DIAGNOSIS — U071 COVID-19: Secondary | ICD-10-CM | POA: Diagnosis not present

## 2022-10-12 DIAGNOSIS — E119 Type 2 diabetes mellitus without complications: Secondary | ICD-10-CM | POA: Diagnosis not present

## 2022-10-12 DIAGNOSIS — R509 Fever, unspecified: Secondary | ICD-10-CM | POA: Diagnosis present

## 2022-10-12 HISTORY — DX: Type 2 diabetes mellitus without complications: E11.9

## 2022-10-12 LAB — SARS CORONAVIRUS 2 BY RT PCR: SARS Coronavirus 2 by RT PCR: POSITIVE — AB

## 2022-10-12 MED ORDER — METHYLPREDNISOLONE 4 MG PO TBPK: ORAL_TABLET | ORAL | 0 refills | Status: AC

## 2022-10-12 MED ORDER — BENZONATATE 100 MG PO CAPS: 100.0000 mg | ORAL_CAPSULE | Freq: Three times a day (TID) | ORAL | 0 refills | Status: AC | PRN

## 2022-10-12 NOTE — ED Triage Notes (Signed)
Patient c/o fever, chills, and headache since Thursday night

## 2022-10-12 NOTE — ED Provider Notes (Signed)
[facility] Provider Note    Event Date/Time   First MD Initiated Contact with Patient 10/12/22 1404     (approximate)   History   Fever   HPI  Marlan Lenney is a 57 y.o. male with history of schizophrenia, diabetes and anxiety presents emergency department with runny nose, cough and congestion since Thursday.  Also has a headache.  Mild throat irritation today but does not complain about it being sore.  No vomiting or diarrhea.  Did take Mucinex without any relief.  Also took Tylenol.      Physical Exam   Triage Vital Signs: ED Triage Vitals  Enc Vitals Group     BP 10/12/22 1330 (!) 128/93     Pulse Rate 10/12/22 1330 (!) 108     Resp 10/12/22 1330 20     Temp 10/12/22 1330 98.9 F (37.2 C)     Temp Source 10/12/22 1330 Oral     SpO2 10/12/22 1330 94 %     Weight 10/12/22 1332 230 lb (104.3 kg)     Height 10/12/22 1332 5\' 6"  (1.676 m)     Head Circumference --      Peak Flow --      Pain Score 10/12/22 1332 5     Pain Loc --      Pain Edu? --      Excl. in GC? --     Most recent vital signs: Vitals:   10/12/22 1330  BP: (!) 128/93  Pulse: (!) 108  Resp: 20  Temp: 98.9 F (37.2 C)  SpO2: 94%     General: Awake, no distress.   CV:  Good peripheral perfusion. regular rate and  rhythm Resp:  Normal effort. Lungs cta Abd:  No distention.   Other:      ED Results / Procedures / Treatments   Labs (all labs ordered are listed, but only abnormal results are displayed) Labs Reviewed  SARS CORONAVIRUS 2 BY RT PCR - Abnormal; Notable for the following components:      Result Value   SARS Coronavirus 2 by RT PCR POSITIVE (*)    All other components within normal limits     EKG     RADIOLOGY Chest x-ray    PROCEDURES:   Procedures   MEDICATIONS ORDERED IN ED: Medications - No data to display   IMPRESSION / MDM / ASSESSMENT AND PLAN / ED COURSE  I reviewed the triage vital signs and the nursing notes.                               Differential diagnosis includes, but is not limited to, COVID, RSV, bronchitis, CAP  Patient's presentation is most consistent with acute illness / injury with system symptoms.   COVID test and chest x-ray ordered  COVID test is positive  Chest x-ray was independently reviewed and interpreted by me as being negative for CAP  I did explain the findings to the patient.  Placed him on a steroid and Tessalon Perles.  He is to follow-up with his regular doctor if not improving to 3 days.  Return if worsening.  Patient does not want Paxlovid as a it is too expensive and be will interfere with a lot of his medications.  Discharged in stable condition.      FINAL CLINICAL IMPRESSION(S) / ED DIAGNOSES   Final diagnoses:  COVID     Rx /  DC Orders   ED Discharge Orders          Ordered    methylPREDNISolone (MEDROL DOSEPAK) 4 MG TBPK tablet        10/12/22 1517    benzonatate (TESSALON PERLES) 100 MG capsule  3 times daily PRN        10/12/22 1517             Note:  This document was prepared using Dragon voice recognition software and may include unintentional dictation errors.    Faythe Ghee, PA-C 10/12/22 1518    Concha Se, MD 10/12/22 [phone]

## 2022-10-12 NOTE — Discharge Instructions (Addendum)
Follow-up with your regular doctor as needed.  Continue to take the Mucinex.  Wear a mask when in public.  You should quarantine for 5 days.  Return if worsening

## 2023-08-20 ENCOUNTER — Encounter: Payer: Self-pay | Admitting: Podiatry

## 2023-08-20 ENCOUNTER — Ambulatory Visit (INDEPENDENT_AMBULATORY_CARE_PROVIDER_SITE_OTHER): Admitting: Podiatry

## 2023-08-20 DIAGNOSIS — B351 Tinea unguium: Secondary | ICD-10-CM | POA: Diagnosis not present

## 2023-08-20 DIAGNOSIS — M79674 Pain in right toe(s): Secondary | ICD-10-CM

## 2023-08-20 DIAGNOSIS — E119 Type 2 diabetes mellitus without complications: Secondary | ICD-10-CM | POA: Diagnosis not present

## 2023-08-20 DIAGNOSIS — M79675 Pain in left toe(s): Secondary | ICD-10-CM

## 2023-08-21 NOTE — Progress Notes (Signed)
  Subjective:  Patient ID: Benjamin Braun, male    DOB: November 21, 1965,  MRN: [id number]  Chief Complaint  Patient presents with   Diabetes    "Trim my toenails."    58 y.o. male presents with the above complaint. History confirmed with patient.  He says his diabetes is well-controlled.  Takes Janumet.  No numbness or tingling  Objective:  Physical Exam: warm, good capillary refill, no trophic changes or ulcerative lesions, normal DP and PT pulses, normal monofilament exam, and normal sensory exam. Left Foot: dystrophic yellowed discolored nail plates with subungual debris Right Foot: dystrophic yellowed discolored nail plates with subungual debris  Assessment:   1. Pain due to onychomycosis of toenails of both feet   2. Encounter for diabetic foot exam (HCC)   3. Controlled type 2 diabetes mellitus without complication, without long-term current use of insulin (HCC)      Plan:  Patient was evaluated and treated and all questions answered.  Patient educated on diabetes. Discussed proper diabetic foot care and discussed risks and complications of disease. Educated patient in depth on reasons to return to the office immediately should he/she discover anything concerning or new on the feet. All questions answered. Discussed proper shoes as well.   Discussed the etiology and treatment options for the condition in detail with the patient.  Recommended debridement of the nails today. Sharp and mechanical debridement performed of all painful and mycotic nails today. Nails debrided in length and thickness using a nail nipper to level of comfort. Discussed treatment options including appropriate shoe gear. Follow up as needed for painful nails.    Return if symptoms worsen or fail to improve.
